# Patient Record
Sex: Female | Born: 1945
Health system: Southern US, Community
[De-identification: ages and names within clinical notes are randomized; demographics above are authoritative.]

## PROBLEM LIST (undated history)

## (undated) DIAGNOSIS — K219 Gastro-esophageal reflux disease without esophagitis: Secondary | ICD-10-CM

## (undated) DIAGNOSIS — E739 Lactose intolerance, unspecified: Secondary | ICD-10-CM

## (undated) DIAGNOSIS — M48061 Spinal stenosis, lumbar region without neurogenic claudication: Secondary | ICD-10-CM

## (undated) DIAGNOSIS — R7881 Bacteremia: Secondary | ICD-10-CM

## (undated) DIAGNOSIS — K589 Irritable bowel syndrome without diarrhea: Secondary | ICD-10-CM

## (undated) DIAGNOSIS — I7090 Unspecified atherosclerosis: Secondary | ICD-10-CM

## (undated) DIAGNOSIS — G629 Polyneuropathy, unspecified: Secondary | ICD-10-CM

## (undated) DIAGNOSIS — IMO0001 Reserved for inherently not codable concepts without codable children: Secondary | ICD-10-CM

## (undated) DIAGNOSIS — F329 Major depressive disorder, single episode, unspecified: Secondary | ICD-10-CM

## (undated) DIAGNOSIS — A0472 Enterocolitis due to Clostridium difficile, not specified as recurrent: Secondary | ICD-10-CM

## (undated) DIAGNOSIS — M1990 Unspecified osteoarthritis, unspecified site: Secondary | ICD-10-CM

## (undated) DIAGNOSIS — K269 Duodenal ulcer, unspecified as acute or chronic, without hemorrhage or perforation: Secondary | ICD-10-CM

## (undated) DIAGNOSIS — B9620 Unspecified Escherichia coli [E. coli] as the cause of diseases classified elsewhere: Secondary | ICD-10-CM

## (undated) DIAGNOSIS — H269 Unspecified cataract: Secondary | ICD-10-CM

## (undated) DIAGNOSIS — D649 Anemia, unspecified: Secondary | ICD-10-CM

## (undated) DIAGNOSIS — G43909 Migraine, unspecified, not intractable, without status migrainosus: Secondary | ICD-10-CM

## (undated) DIAGNOSIS — K5790 Diverticulosis of intestine, part unspecified, without perforation or abscess without bleeding: Secondary | ICD-10-CM

## (undated) DIAGNOSIS — T7840XA Allergy, unspecified, initial encounter: Secondary | ICD-10-CM

## (undated) DIAGNOSIS — I10 Essential (primary) hypertension: Secondary | ICD-10-CM

## (undated) DIAGNOSIS — F419 Anxiety disorder, unspecified: Secondary | ICD-10-CM

## (undated) DIAGNOSIS — F32A Depression, unspecified: Secondary | ICD-10-CM

## (undated) DIAGNOSIS — R7303 Prediabetes: Secondary | ICD-10-CM

## (undated) HISTORY — DX: Allergy, unspecified, initial encounter: T78.40XA

## (undated) HISTORY — DX: Unspecified Escherichia coli (E. coli) as the cause of diseases classified elsewhere: B96.20

## (undated) HISTORY — DX: Enterocolitis due to Clostridium difficile, not specified as recurrent: A04.72

## (undated) HISTORY — DX: Bacteremia: R78.81

## (undated) HISTORY — DX: Diverticulosis of intestine, part unspecified, without perforation or abscess without bleeding: K57.90

## (undated) HISTORY — PX: DIAGNOSTIC LAPAROSCOPY: SUR761

## (undated) HISTORY — DX: Irritable bowel syndrome, unspecified: K58.9

## (undated) HISTORY — DX: Unspecified cataract: H26.9

## (undated) HISTORY — DX: Anemia, unspecified: D64.9

## (undated) HISTORY — PX: CARPAL TUNNEL RELEASE: SHX101

## (undated) HISTORY — DX: Duodenal ulcer, unspecified as acute or chronic, without hemorrhage or perforation: K26.9

## (undated) HISTORY — PX: MENISCUS REPAIR: SHX5179

## (undated) HISTORY — PX: TOE SURGERY: SHX1073

## (undated) HISTORY — DX: Lactose intolerance, unspecified: E73.9

## (undated) HISTORY — PX: BLADDER SURGERY: SHX569

## (undated) HISTORY — PX: BACK SURGERY: SHX140

## (undated) HISTORY — DX: Unspecified osteoarthritis, unspecified site: M19.90

## (undated) HISTORY — DX: Essential (primary) hypertension: I10

## (undated) HISTORY — PX: WISDOM TOOTH EXTRACTION: SHX21

## (undated) HISTORY — PX: APPENDECTOMY: SHX54

## (undated) HISTORY — PX: OTHER SURGICAL HISTORY: SHX169

## (undated) HISTORY — DX: Anxiety disorder, unspecified: F41.9

## (undated) HISTORY — DX: Migraine, unspecified, not intractable, without status migrainosus: G43.909

---
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
## 1991-12-22 HISTORY — PX: TOTAL ABDOMINAL HYSTERECTOMY: SHX209

## 1998-12-16 ENCOUNTER — Encounter: Payer: Self-pay | Admitting: Gastroenterology

## 1998-12-16 ENCOUNTER — Ambulatory Visit (HOSPITAL_COMMUNITY): Admission: RE | Admit: 1998-12-16 | Discharge: 1998-12-16 | Payer: Self-pay | Admitting: Gastroenterology

## 1999-07-26 ENCOUNTER — Emergency Department (HOSPITAL_COMMUNITY): Admission: EM | Admit: 1999-07-26 | Discharge: 1999-07-26 | Payer: Self-pay | Admitting: Emergency Medicine

## 2000-02-06 ENCOUNTER — Other Ambulatory Visit: Admission: RE | Admit: 2000-02-06 | Discharge: 2000-02-06 | Payer: Self-pay | Admitting: *Deleted

## 2000-05-13 ENCOUNTER — Encounter: Payer: Self-pay | Admitting: *Deleted

## 2000-05-13 ENCOUNTER — Encounter: Admission: RE | Admit: 2000-05-13 | Discharge: 2000-05-13 | Payer: Self-pay | Admitting: *Deleted

## 2000-05-21 ENCOUNTER — Encounter: Admission: RE | Admit: 2000-05-21 | Discharge: 2000-05-21 | Payer: Self-pay | Admitting: *Deleted

## 2000-05-21 ENCOUNTER — Encounter: Payer: Self-pay | Admitting: *Deleted

## 2001-05-12 ENCOUNTER — Other Ambulatory Visit: Admission: RE | Admit: 2001-05-12 | Discharge: 2001-05-12 | Payer: Self-pay | Admitting: *Deleted

## 2001-06-04 ENCOUNTER — Emergency Department (HOSPITAL_COMMUNITY): Admission: EM | Admit: 2001-06-04 | Discharge: 2001-06-04 | Payer: Self-pay

## 2001-06-14 ENCOUNTER — Emergency Department (HOSPITAL_COMMUNITY): Admission: EM | Admit: 2001-06-14 | Discharge: 2001-06-14 | Payer: Self-pay | Admitting: Emergency Medicine

## 2002-01-20 ENCOUNTER — Encounter: Payer: Self-pay | Admitting: Family Medicine

## 2002-01-20 ENCOUNTER — Encounter: Admission: RE | Admit: 2002-01-20 | Discharge: 2002-01-20 | Payer: Self-pay | Admitting: Family Medicine

## 2002-04-19 ENCOUNTER — Encounter: Payer: Self-pay | Admitting: Family Medicine

## 2002-04-19 ENCOUNTER — Encounter: Admission: RE | Admit: 2002-04-19 | Discharge: 2002-04-19 | Payer: Self-pay | Admitting: Family Medicine

## 2002-06-28 ENCOUNTER — Encounter: Payer: Self-pay | Admitting: Gastroenterology

## 2004-12-29 ENCOUNTER — Ambulatory Visit: Payer: Self-pay | Admitting: Cardiology

## 2005-01-08 ENCOUNTER — Ambulatory Visit: Payer: Self-pay

## 2005-02-13 ENCOUNTER — Encounter: Admission: RE | Admit: 2005-02-13 | Discharge: 2005-02-13 | Payer: Self-pay | Admitting: Family Medicine

## 2006-02-24 ENCOUNTER — Encounter: Admission: RE | Admit: 2006-02-24 | Discharge: 2006-02-24 | Payer: Self-pay | Admitting: *Deleted

## 2006-06-30 ENCOUNTER — Encounter: Admission: RE | Admit: 2006-06-30 | Discharge: 2006-06-30 | Payer: Self-pay | Admitting: Family Medicine

## 2006-07-02 ENCOUNTER — Ambulatory Visit (HOSPITAL_COMMUNITY): Admission: RE | Admit: 2006-07-02 | Discharge: 2006-07-02 | Payer: Self-pay | Admitting: Family Medicine

## 2008-07-06 ENCOUNTER — Encounter: Payer: Self-pay | Admitting: Gastroenterology

## 2008-07-21 LAB — CONVERTED CEMR LAB: Pap Smear: NORMAL

## 2008-07-30 ENCOUNTER — Telehealth: Payer: Self-pay | Admitting: Gastroenterology

## 2008-08-10 ENCOUNTER — Encounter: Payer: Self-pay | Admitting: Gastroenterology

## 2009-01-04 ENCOUNTER — Encounter: Payer: Self-pay | Admitting: Family Medicine

## 2009-02-25 ENCOUNTER — Emergency Department (HOSPITAL_COMMUNITY): Admission: EM | Admit: 2009-02-25 | Discharge: 2009-02-25 | Payer: Self-pay | Admitting: Emergency Medicine

## 2009-03-27 ENCOUNTER — Ambulatory Visit: Payer: Self-pay | Admitting: Gastroenterology

## 2009-03-27 DIAGNOSIS — K921 Melena: Secondary | ICD-10-CM | POA: Insufficient documentation

## 2009-03-27 DIAGNOSIS — R1319 Other dysphagia: Secondary | ICD-10-CM | POA: Insufficient documentation

## 2009-03-27 DIAGNOSIS — R079 Chest pain, unspecified: Secondary | ICD-10-CM | POA: Insufficient documentation

## 2009-03-28 ENCOUNTER — Telehealth: Payer: Self-pay | Admitting: Gastroenterology

## 2009-04-01 ENCOUNTER — Ambulatory Visit: Payer: Self-pay | Admitting: Gastroenterology

## 2009-04-01 ENCOUNTER — Encounter: Payer: Self-pay | Admitting: Gastroenterology

## 2009-04-01 LAB — HM COLONOSCOPY

## 2009-04-03 ENCOUNTER — Encounter: Payer: Self-pay | Admitting: Gastroenterology

## 2009-04-03 ENCOUNTER — Telehealth: Payer: Self-pay | Admitting: Gastroenterology

## 2009-04-11 ENCOUNTER — Ambulatory Visit: Payer: Self-pay | Admitting: Family Medicine

## 2009-04-11 DIAGNOSIS — I10 Essential (primary) hypertension: Secondary | ICD-10-CM | POA: Insufficient documentation

## 2009-04-11 DIAGNOSIS — K279 Peptic ulcer, site unspecified, unspecified as acute or chronic, without hemorrhage or perforation: Secondary | ICD-10-CM | POA: Insufficient documentation

## 2009-05-14 ENCOUNTER — Ambulatory Visit: Payer: Self-pay | Admitting: Gastroenterology

## 2009-05-14 DIAGNOSIS — K259 Gastric ulcer, unspecified as acute or chronic, without hemorrhage or perforation: Secondary | ICD-10-CM | POA: Insufficient documentation

## 2009-05-14 DIAGNOSIS — K269 Duodenal ulcer, unspecified as acute or chronic, without hemorrhage or perforation: Secondary | ICD-10-CM | POA: Insufficient documentation

## 2009-06-09 ENCOUNTER — Emergency Department (HOSPITAL_COMMUNITY): Admission: EM | Admit: 2009-06-09 | Discharge: 2009-06-09 | Payer: Self-pay | Admitting: Emergency Medicine

## 2009-08-07 ENCOUNTER — Ambulatory Visit: Payer: Self-pay | Admitting: Family Medicine

## 2009-08-07 DIAGNOSIS — R519 Headache, unspecified: Secondary | ICD-10-CM | POA: Insufficient documentation

## 2009-08-07 DIAGNOSIS — R51 Headache: Secondary | ICD-10-CM | POA: Insufficient documentation

## 2009-08-07 DIAGNOSIS — R5383 Other fatigue: Secondary | ICD-10-CM

## 2009-08-07 DIAGNOSIS — R5381 Other malaise: Secondary | ICD-10-CM | POA: Insufficient documentation

## 2009-09-13 ENCOUNTER — Telehealth: Payer: Self-pay | Admitting: Family Medicine

## 2010-01-31 ENCOUNTER — Ambulatory Visit: Payer: Self-pay | Admitting: Family Medicine

## 2010-01-31 DIAGNOSIS — M545 Low back pain, unspecified: Secondary | ICD-10-CM | POA: Insufficient documentation

## 2010-01-31 DIAGNOSIS — R635 Abnormal weight gain: Secondary | ICD-10-CM | POA: Insufficient documentation

## 2010-01-31 LAB — CONVERTED CEMR LAB
Bilirubin Urine: NEGATIVE
Glucose, Urine, Semiquant: NEGATIVE
Ketones, urine, test strip: NEGATIVE
Urobilinogen, UA: 0.2

## 2010-02-03 ENCOUNTER — Telehealth: Payer: Self-pay | Admitting: Family Medicine

## 2010-02-03 LAB — CONVERTED CEMR LAB
AST: 20 units/L (ref 0–37)
Albumin: 3.8 g/dL (ref 3.5–5.2)
Bilirubin, Direct: 0.1 mg/dL (ref 0.0–0.3)
Chloride: 100 meq/L (ref 96–112)
Eosinophils Absolute: 0.1 10*3/uL (ref 0.0–0.7)
GFR calc non Af Amer: 59.72 mL/min (ref >60)
Glucose, Bld: 77 mg/dL (ref 70–99)
HCT: 40.1 % (ref 36.0–46.0)
Hemoglobin: 13.5 g/dL (ref 12.0–15.0)
Hgb A1c MFr Bld: 5.8 % (ref 4.6–6.5)
Lymphocytes Relative: 23.9 % (ref 12.0–46.0)
Monocytes Relative: 5.3 % (ref 3.0–12.0)
Neutro Abs: 4.7 10*3/uL (ref 1.4–7.7)
Platelets: 244 10*3/uL (ref 150.0–400.0)
Sed Rate: 23 mm/hr — ABNORMAL HIGH (ref 0–22)
TSH: 1.6 microintl units/mL (ref 0.35–5.50)
Total Protein: 6.6 g/dL (ref 6.0–8.3)
WBC: 6.8 10*3/uL (ref 4.5–10.5)

## 2010-09-04 ENCOUNTER — Ambulatory Visit: Payer: Self-pay | Admitting: Family Medicine

## 2010-09-04 DIAGNOSIS — M79609 Pain in unspecified limb: Secondary | ICD-10-CM | POA: Insufficient documentation

## 2010-09-05 ENCOUNTER — Telehealth: Payer: Self-pay | Admitting: Family Medicine

## 2010-09-11 LAB — CONVERTED CEMR LAB
ALT: 15 units/L (ref 0–35)
AST: 19 units/L (ref 0–37)
BUN: 30 mg/dL — ABNORMAL HIGH (ref 6–23)
Bilirubin, Direct: 0.1 mg/dL (ref 0.0–0.3)
Calcium: 9.6 mg/dL (ref 8.4–10.5)
Creatinine, Ser: 0.9 mg/dL (ref 0.4–1.2)
Direct LDL: 154 mg/dL
Glucose, Bld: 82 mg/dL (ref 70–99)
Hemoglobin: 14 g/dL (ref 12.0–15.0)
Lymphocytes Relative: 24.5 % (ref 12.0–46.0)
Lymphs Abs: 1.5 10*3/uL (ref 0.7–4.0)
Monocytes Relative: 7.4 % (ref 3.0–12.0)
Neutrophils Relative %: 66.3 % (ref 43.0–77.0)
RDW: 13.2 % (ref 11.5–14.6)
TSH: 1.14 microintl units/mL (ref 0.35–5.50)
Triglycerides: 56 mg/dL (ref 0.0–149.0)
VLDL: 11.2 mg/dL (ref 0.0–40.0)

## 2010-09-26 ENCOUNTER — Encounter: Payer: Self-pay | Admitting: Family Medicine

## 2010-09-26 ENCOUNTER — Ambulatory Visit: Payer: Self-pay | Admitting: Internal Medicine

## 2010-09-26 ENCOUNTER — Telehealth: Payer: Self-pay | Admitting: Internal Medicine

## 2010-09-26 DIAGNOSIS — L03211 Cellulitis of face: Secondary | ICD-10-CM

## 2010-09-26 DIAGNOSIS — L0201 Cutaneous abscess of face: Secondary | ICD-10-CM | POA: Insufficient documentation

## 2010-09-26 LAB — HM MAMMOGRAPHY: HM Mammogram: NORMAL

## 2010-09-27 ENCOUNTER — Ambulatory Visit: Payer: Self-pay | Admitting: Family Medicine

## 2010-09-29 ENCOUNTER — Ambulatory Visit: Payer: Self-pay | Admitting: Internal Medicine

## 2010-09-29 DIAGNOSIS — A4902 Methicillin resistant Staphylococcus aureus infection, unspecified site: Secondary | ICD-10-CM | POA: Insufficient documentation

## 2010-09-29 DIAGNOSIS — R197 Diarrhea, unspecified: Secondary | ICD-10-CM | POA: Insufficient documentation

## 2010-09-30 ENCOUNTER — Encounter: Payer: Self-pay | Admitting: Internal Medicine

## 2010-11-03 ENCOUNTER — Telehealth: Payer: Self-pay | Admitting: Internal Medicine

## 2011-01-20 NOTE — Consult Note (Signed)
Summary: [facility]  [facility]   Imported By: Maryln Gottron 10/03/2010 10:37:18  _____________________________________________________________________  External Attachment:    Type:   Image     Comment:   External Document

## 2011-01-20 NOTE — Progress Notes (Signed)
Summary: Patient Concerns  Phone Note Call from Patient Call back at [phone]   Caller: Patient Summary of Call: Patient called this morning with some concerns with the billing of her last OV's and the amount of rocpehin recieved at the appt with the Saturday clinic. I releived the concerns on the billing portion even though patient was still unhappy that the administration fee for an injection during the OV was not included in the OV price.   Patient was most unhappy with the fact that she recieved 1 unit of rocephin at her visit with Dr. Fabian Sharp and then received 4 units of rocephin at the Saturday clinic visit with Dr. Milinda Antis. Patient was told to follow-up at Saturday clinic by Dr. Fabian Sharp. Patient stated that after the saturday appt she started feeling very anxious and through out the next few days had alot of abd cramping and anxiety, and diarrhea. Patient is just very concerned about the amount of the abx she had in her body.  Initial call taken by: Harold Barban,  November 03, 2010 11:15 AM  Follow-up for Phone Call        patient had  standard  1 gram of rocephin  each day  dosing. ( it is billed in 250 mg units and thus says 4)   Have triage   discuss clinical concerns with her. If still have Gi problems then  may need a follow up .  Follow-up by: Madelin Headings MD,  November 03, 2010 12:50 PM  Additional Follow-up for Phone Call Additional follow up Details #1::        explained to pt that in our computer system Rocephin is billed at 250mg  and we have to change the units when we give 1g.  She wasn't thrilled about it but she understood Additional Follow-up by: Alfred Levins, CMA,  November 03, 2010 1:14 PM
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
## 2011-01-20 NOTE — Progress Notes (Signed)
Summary: REQ FOR LAB RESULTS  Phone Note Call from Patient   Caller: Patient [phone] Reason for Call: Talk to Nurse Summary of Call: Pt called to req lab results...Marland KitchenMarland Kitchen Pt adv that she can be reached at [phone] to advise.  Pt adv that she will need some type of documentation stating that she has had labs (pt adv that it doesn't have to be lab results).... Pt req same so that it can be turned into insurance for wellness benefit??   Initial call taken by: Debbra Riding,  February 03, 2010 10:29 AM  Follow-up for Phone Call        Labs printed and mailed to pt home Follow-up by: Sid Falcon LPN,  February 03, 2010 1:58 PM

## 2011-01-20 NOTE — Progress Notes (Signed)
Summary: pt feeling feverish  Phone Note Call from Patient Call back at Home Phone [phone]   Caller: Patient Summary of Call: Pt state that she is starting to feel feverish, her cheeks are warm and rosey. Pt doesn't feel bad. Pt wants to know if she can take the other medication.   Initial call taken by: Romualdo Bolk, CMA Duncan Dull),  September 26, 2010 4:57 PM  Follow-up for Phone Call        Per Dr. Fabian Sharp- can go ahead and take the other antibiotic. If fever gets 101-102 call the oncall nurse and keep appt for tomorrow. Pt aware of this. Follow-up by: Romualdo Bolk, CMA (AAMA),  September 26, 2010 4:58 PM
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
## 2011-01-20 NOTE — Progress Notes (Signed)
Summary: Home BP cuff Rx request  Phone Note Call from Patient Call back at Home Phone [phone]   Caller: Patient Call For: Taylor Peat MD Summary of Call: Pt calling to ask for a written Rx for Home BP cuff.  If she has a prescription, her FLEX acct will help pay for it. Her wrist one is not accurate.  Also, to help with her cronic headaches, is there something Dr [name] can recomend to replace the Darvocet.  Currently taking the "Goodys extra strength  (she stated she knew she was not supposed to be taking it) Initial call taken by: Sid Falcon LPN,  September 05, 2010 12:32 PM  Follow-up for Phone Call        I will write Rx for BP cuff. For her chronic headaches, I rec trial of Nortriptyline 25 mg by mouth at bedtime and office f/u in 2 months to reassess.  We will be trying this in attempt to blunt/prevent her headaches.  She should not use this acutely. Follow-up by: Taylor Peat MD,  September 05, 2010 1:27 PM  Additional Follow-up for Phone Call Additional follow up Details #1::        Pt had a bad reaction to Amitriptyline 15 years ago,  "sob, could not catch her breath, could not move, felt like her heart stopped".    This med was not on her allergy list, so it has just been added Additional Follow-up by: Sid Falcon LPN,  September 05, 2010 5:00 PM   New Allergies: ! AMITRIPTYLINE HCL (AMITRIPTYLINE HCL) Additional Follow-up for Phone Call Additional follow up Details #2::    Per Dr [name], inform pt to not take this medicine at this time.  [facility] informed of allergy, cancelled Rx order at this time.  Pt informed and Dr [name] suggested she come for OV to further discuss, pt voiced her understanding.  She will pick-up BP prescription at that time Follow-up by: Sid Falcon LPN,  September 05, 2010 5:09 PM  New/Updated Medications: NORTRIPTYLINE HCL 25 MG CAPS (NORTRIPTYLINE HCL) one by mouth daily at bedtime New Allergies: !  AMITRIPTYLINE HCL (AMITRIPTYLINE HCL)Prescriptions: NORTRIPTYLINE HCL 25 MG CAPS (NORTRIPTYLINE HCL) one by mouth daily at bedtime  #30 x 1   Entered by:   Sid Falcon LPN   Authorized by:   Taylor Peat MD   Signed by:   Sid Falcon LPN on 12/23/7251   Method used:   Electronically to        [facility]* (retail)       [address]       [place], [place]  [address]       Ph: [phone]       Fax: [phone]   RxID:   [id number]
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
## 2011-03-06 ENCOUNTER — Ambulatory Visit (INDEPENDENT_AMBULATORY_CARE_PROVIDER_SITE_OTHER): Payer: 59 | Admitting: Family Medicine

## 2011-03-06 ENCOUNTER — Telehealth: Payer: Self-pay | Admitting: *Deleted

## 2011-03-06 VITALS — BP 130/90 | Temp 98.6°F | Ht 67.25 in | Wt 188.0 lb

## 2011-03-06 DIAGNOSIS — R5383 Other fatigue: Secondary | ICD-10-CM

## 2011-03-06 DIAGNOSIS — R5381 Other malaise: Secondary | ICD-10-CM

## 2011-03-06 DIAGNOSIS — M25519 Pain in unspecified shoulder: Secondary | ICD-10-CM

## 2011-03-06 DIAGNOSIS — R413 Other amnesia: Secondary | ICD-10-CM

## 2011-03-06 DIAGNOSIS — Z780 Asymptomatic menopausal state: Secondary | ICD-10-CM

## 2011-03-06 LAB — VITAMIN B12: Vitamin B-12: 374 pg/mL (ref 211–911)

## 2011-03-06 LAB — TSH: TSH: 1.24 u[IU]/mL (ref 0.35–5.50)

## 2011-03-06 NOTE — Telephone Encounter (Signed)
Appt schueduled today as she only has one day off work.

## 2011-03-06 NOTE — Progress Notes (Signed)
  Subjective:    Patient ID: Taylor Gay, female    DOB: [birth year], 65 y.o.   MRN: [id number]  HPI  Patient seen to discuss the following issues   Some persistent right upper extremity pain. Was seen by orthopedist and had shoulder injected for presumed bursitis or rotator cuff tendinitis. No improvement. Pain is mostly deltoid region. Worse with internal rotation and abduction against resistance. She's tried over-the-counter analgesics without much improvement. No neck pain. Previous x-rays did not show any significant degenerative changes. Also had x-rays of humerus which were unremarkable.    Patient has some concerns for some short-term memory deficits. Sometimes forgets things like where she is driving. She has increased fatigue. She is concerned that the symptoms are related to post-menopause and estrogen deficiency. No headaches or any focal neurologic symptoms.   Patient has several questions regarding hormone replacement. Previously has taken estrogen patch. Has been considering some type of compounded topical estrogen. Has frequent hot flashes. Decreased energy. Previous hysterectomy.   Review of Systems  Constitutional: Negative for fever, chills, appetite change and fatigue.  HENT: Negative for ear pain and sore throat.   Eyes: Negative for visual disturbance.  Respiratory: Negative for cough and shortness of breath.   Cardiovascular: Negative for chest pain, palpitations and leg swelling.  Gastrointestinal: Negative for abdominal pain.  Genitourinary: Negative for dysuria.  Skin: Negative for rash.  Neurological: Negative for syncope and headaches.  Psychiatric/Behavioral: Negative for dysphoric mood.       Objective:   Physical Exam  patient is alert and healthy in appearance. Neck supple no mass Chest clear to auscultation Heart regular rhythm and rate with no murmur   right shoulder reveals restricted range of motion with internal rotation and abduction secondary to  pain. No a.c. Joint tenderness. No bicipital tenderness. Extremities no edema. Skin no rash.       Assessment & Plan:   #1 right shoulder and arm pain. Suspect supraspinatous tendinitis. She is not interested in further injections at this time. Discussed physical therapy but she is not interested.  #2 postmenopausal. She is considering going back on topical estrogen  #3 question of impaired short-term memory. Check TSH and B12

## 2011-03-07 ENCOUNTER — Encounter: Payer: Self-pay | Admitting: Family Medicine

## 2011-03-09 NOTE — Progress Notes (Signed)
Quick Note:  Pt informed and I asked pt to call back with Vivelle dot information is she is still interrested ______

## 2011-03-10 ENCOUNTER — Telehealth: Payer: Self-pay | Admitting: *Deleted

## 2011-03-10 DIAGNOSIS — Z780 Asymptomatic menopausal state: Secondary | ICD-10-CM

## 2011-03-10 DIAGNOSIS — R52 Pain, unspecified: Secondary | ICD-10-CM

## 2011-03-10 MED ORDER — ESTRADIOL 0.0375 MG/24HR TD PTTW: 1.0000 | MEDICATED_PATCH | TRANSDERMAL | Status: DC

## 2011-03-10 NOTE — Telephone Encounter (Signed)
OK to use the ketoprofen and I would recommend we increase her Vivelle to 0.0375 mg patch and Ok to refill for 6 months.  I went ahead and created rx and sent to [facility].  Let patient know.  Thanks.

## 2011-03-10 NOTE — Telephone Encounter (Signed)
Pt called to report the [place] is not covered by her insurance plan, $137 + out of pocket. [facility] can compound Ketoprofen 20% in PLO.?  $37.00, apply to affected area 3 times a day as needed.  Also, pt was on Vivelle dot 0.025, but it was not very effective. She would be interrested in a compound product or a higher Vivelle patch

## 2011-03-11 NOTE — Telephone Encounter (Signed)
Pt informed Ketoprofen called in, OK for [facility] to compound as well as Vivelle Dot, informed on pt VM

## 2011-03-12 NOTE — Telephone Encounter (Signed)
completed

## 2011-04-02 LAB — BASIC METABOLIC PANEL
CO2: 29 mEq/L (ref 19–32)
Calcium: 8.7 mg/dL (ref 8.4–10.5)
Chloride: 100 mEq/L (ref 96–112)
Creatinine, Ser: 1.11 mg/dL (ref 0.4–1.2)
GFR calc Af Amer: >60 mL/min (ref >60)
GFR calc non Af Amer: 50 mL/min — ABNORMAL LOW (ref >60)
Potassium: 3.2 mEq/L — ABNORMAL LOW (ref 3.5–5.1)
Sodium: 136 mEq/L (ref 135–145)

## 2011-04-02 LAB — POCT CARDIAC MARKERS
CKMB, poc: <1 ng/mL — ABNORMAL LOW (ref 1.0–8.0)
Myoglobin, poc: 71.7 ng/mL (ref 12–200)
Troponin i, poc: <0.05 ng/mL (ref 0.00–0.09)

## 2011-04-02 LAB — CBC
Hemoglobin: 13.9 g/dL (ref 12.0–15.0)
RBC: 4.44 MIL/uL (ref 3.87–5.11)
RDW: 13.5 % (ref 11.5–15.5)

## 2011-04-02 LAB — DIFFERENTIAL
Basophils Relative: 1 % (ref 0–1)
Lymphs Abs: 2.3 10*3/uL (ref 0.7–4.0)
Neutro Abs: 4.4 10*3/uL (ref 1.7–7.7)

## 2012-01-16 ENCOUNTER — Encounter (HOSPITAL_COMMUNITY): Payer: Self-pay

## 2012-01-16 ENCOUNTER — Emergency Department (HOSPITAL_COMMUNITY): Payer: 59

## 2012-01-16 ENCOUNTER — Other Ambulatory Visit: Payer: Self-pay

## 2012-01-16 ENCOUNTER — Encounter (HOSPITAL_COMMUNITY): Admission: EM | Disposition: A | Discharge status: Home / Self Care | Payer: Self-pay | Source: Home / Self Care

## 2012-01-16 ENCOUNTER — Emergency Department (HOSPITAL_COMMUNITY): Payer: 59 | Admitting: Anesthesiology

## 2012-01-16 ENCOUNTER — Encounter (HOSPITAL_COMMUNITY): Payer: Self-pay | Admitting: Anesthesiology

## 2012-01-16 ENCOUNTER — Inpatient Hospital Stay (HOSPITAL_COMMUNITY)
Admission: EM | Admit: 2012-01-16 | Discharge: 2012-01-18 | DRG: 494 | Disposition: A | Discharge status: Home / Self Care | Payer: 59 | Attending: Emergency Medicine | Admitting: Emergency Medicine

## 2012-01-16 DIAGNOSIS — M129 Arthropathy, unspecified: Secondary | ICD-10-CM | POA: Diagnosis present

## 2012-01-16 DIAGNOSIS — G43909 Migraine, unspecified, not intractable, without status migrainosus: Secondary | ICD-10-CM | POA: Diagnosis present

## 2012-01-16 DIAGNOSIS — Z8711 Personal history of peptic ulcer disease: Secondary | ICD-10-CM

## 2012-01-16 DIAGNOSIS — S82853A Displaced trimalleolar fracture of unspecified lower leg, initial encounter for closed fracture: Principal | ICD-10-CM | POA: Diagnosis present

## 2012-01-16 DIAGNOSIS — K5730 Diverticulosis of large intestine without perforation or abscess without bleeding: Secondary | ICD-10-CM | POA: Diagnosis present

## 2012-01-16 DIAGNOSIS — K589 Irritable bowel syndrome without diarrhea: Secondary | ICD-10-CM | POA: Diagnosis present

## 2012-01-16 DIAGNOSIS — I10 Essential (primary) hypertension: Secondary | ICD-10-CM | POA: Diagnosis present

## 2012-01-16 DIAGNOSIS — S82842A Displaced bimalleolar fracture of left lower leg, initial encounter for closed fracture: Secondary | ICD-10-CM

## 2012-01-16 DIAGNOSIS — E739 Lactose intolerance, unspecified: Secondary | ICD-10-CM | POA: Diagnosis present

## 2012-01-16 DIAGNOSIS — F411 Generalized anxiety disorder: Secondary | ICD-10-CM | POA: Diagnosis present

## 2012-01-16 DIAGNOSIS — Z79899 Other long term (current) drug therapy: Secondary | ICD-10-CM

## 2012-01-16 DIAGNOSIS — W010XXA Fall on same level from slipping, tripping and stumbling without subsequent striking against object, initial encounter: Secondary | ICD-10-CM | POA: Diagnosis present

## 2012-01-16 DIAGNOSIS — Y998 Other external cause status: Secondary | ICD-10-CM

## 2012-01-16 DIAGNOSIS — Y92009 Unspecified place in unspecified non-institutional (private) residence as the place of occurrence of the external cause: Secondary | ICD-10-CM

## 2012-01-16 HISTORY — PX: ORIF ANKLE FRACTURE: SHX5408

## 2012-01-16 LAB — BASIC METABOLIC PANEL
BUN: 31 mg/dL — ABNORMAL HIGH (ref 6–23)
Sodium: 138 mEq/L (ref 135–145)

## 2012-01-16 LAB — CBC
HCT: 39.1 % (ref 36.0–46.0)
MCV: 84.3 fL (ref 78.0–100.0)
Platelets: 262 10*3/uL (ref 150–400)

## 2012-01-16 LAB — SAMPLE TO BLOOD BANK

## 2012-01-16 SURGERY — OPEN REDUCTION INTERNAL FIXATION (ORIF) ANKLE FRACTURE
Anesthesia: General | Site: Ankle | Laterality: Left | Wound class: Clean

## 2012-01-16 MED ORDER — POTASSIUM CHLORIDE 10 MEQ/100ML IV SOLN
10.0000 meq | INTRAVENOUS | Status: DC
  Administered 2012-01-16: 10 meq via INTRAVENOUS
  Filled 2012-01-16: qty 100

## 2012-01-16 MED ORDER — TRIAMTERENE-HCTZ 75-50 MG PO TABS
1.0000 | ORAL_TABLET | Freq: Every day | ORAL | Status: DC
  Administered 2012-01-17: 1 via ORAL
  Filled 2012-01-16 (×2): qty 1

## 2012-01-16 MED ORDER — ONDANSETRON HCL 4 MG/2ML IJ SOLN: 4.0000 mg | Freq: Four times a day (QID) | INTRAMUSCULAR | Status: DC | PRN

## 2012-01-16 MED ORDER — CEFAZOLIN SODIUM 1-5 GM-% IV SOLN
1.0000 g | Freq: Three times a day (TID) | INTRAVENOUS | Status: AC
  Administered 2012-01-17 (×2): 1 g via INTRAVENOUS
  Filled 2012-01-16 (×3): qty 50

## 2012-01-16 MED ORDER — VECURONIUM BROMIDE 10 MG IV SOLR
INTRAVENOUS | Status: DC | PRN
  Administered 2012-01-16: 5 mg via INTRAVENOUS
  Administered 2012-01-16: 2 mg via INTRAVENOUS

## 2012-01-16 MED ORDER — CLINDAMYCIN PHOSPHATE 600 MG/50ML IV SOLN
INTRAVENOUS | Status: DC | PRN
  Administered 2012-01-16: 600 mg via INTRAVENOUS

## 2012-01-16 MED ORDER — LORAZEPAM 2 MG/ML IJ SOLN
INTRAMUSCULAR | Status: AC
  Filled 2012-01-16: qty 1

## 2012-01-16 MED ORDER — GLYCOPYRROLATE 0.2 MG/ML IJ SOLN
INTRAMUSCULAR | Status: DC | PRN
  Administered 2012-01-16: .6 mg via INTRAVENOUS

## 2012-01-16 MED ORDER — ACETAMINOPHEN 10 MG/ML IV SOLN
INTRAVENOUS | Status: DC | PRN
  Administered 2012-01-16: 1000 mg via INTRAVENOUS

## 2012-01-16 MED ORDER — NEOSTIGMINE METHYLSULFATE 1 MG/ML IJ SOLN
INTRAMUSCULAR | Status: DC | PRN
  Administered 2012-01-16: 5 mg via INTRAVENOUS

## 2012-01-16 MED ORDER — OXYCODONE-ACETAMINOPHEN 5-325 MG PO TABS
1.0000 | ORAL_TABLET | ORAL | Status: DC | PRN
  Administered 2012-01-17: 2 via ORAL
  Administered 2012-01-17 – 2012-01-18 (×4): 1 via ORAL
  Filled 2012-01-16 (×4): qty 1
  Filled 2012-01-16: qty 2

## 2012-01-16 MED ORDER — CLINDAMYCIN PHOSPHATE 600 MG/50ML IV SOLN
600.0000 mg | INTRAVENOUS | Status: DC
  Filled 2012-01-16: qty 50

## 2012-01-16 MED ORDER — SODIUM CHLORIDE 0.9 % IV SOLN
INTRAVENOUS | Status: AC | PRN
  Administered 2012-01-16: 300 mL via INTRAVENOUS

## 2012-01-16 MED ORDER — DEXAMETHASONE SODIUM PHOSPHATE 4 MG/ML IJ SOLN
INTRAMUSCULAR | Status: DC | PRN
  Administered 2012-01-16: 4 mg via INTRAVENOUS

## 2012-01-16 MED ORDER — METHOCARBAMOL 100 MG/ML IJ SOLN
500.0000 mg | Freq: Four times a day (QID) | INTRAVENOUS | Status: DC | PRN
  Filled 2012-01-16: qty 5

## 2012-01-16 MED ORDER — ASPIRIN 325 MG PO TABS
325.0000 mg | ORAL_TABLET | Freq: Two times a day (BID) | ORAL | Status: DC
  Administered 2012-01-17 – 2012-01-18 (×3): 325 mg via ORAL
  Filled 2012-01-16 (×5): qty 1

## 2012-01-16 MED ORDER — PROPOFOL 10 MG/ML IV EMUL
INTRAVENOUS | Status: DC | PRN
  Administered 2012-01-16: 200 mg via INTRAVENOUS

## 2012-01-16 MED ORDER — FAMOTIDINE 20 MG PO TABS
20.0000 mg | ORAL_TABLET | Freq: Two times a day (BID) | ORAL | Status: DC
  Administered 2012-01-16 – 2012-01-17 (×3): 20 mg via ORAL
  Filled 2012-01-16 (×5): qty 1

## 2012-01-16 MED ORDER — METHOCARBAMOL 500 MG PO TABS
500.0000 mg | ORAL_TABLET | Freq: Four times a day (QID) | ORAL | Status: DC | PRN
  Administered 2012-01-17 – 2012-01-18 (×3): 500 mg via ORAL
  Filled 2012-01-16 (×3): qty 1

## 2012-01-16 MED ORDER — ESMOLOL HCL 10 MG/ML IV SOLN
INTRAVENOUS | Status: DC | PRN
  Administered 2012-01-16: 10 mg via INTRAVENOUS
  Administered 2012-01-16: 40 mg via INTRAVENOUS

## 2012-01-16 MED ORDER — CEFAZOLIN SODIUM 1-5 GM-% IV SOLN
1.0000 g | Freq: Three times a day (TID) | INTRAVENOUS | Status: DC
  Administered 2012-01-16: 2 g via INTRAVENOUS
  Filled 2012-01-16 (×3): qty 50

## 2012-01-16 MED ORDER — CLINDAMYCIN PHOSPHATE 600 MG/50ML IV SOLN
600.0000 mg | Freq: Three times a day (TID) | INTRAVENOUS | Status: AC
  Administered 2012-01-16 – 2012-01-17 (×2): 600 mg via INTRAVENOUS
  Filled 2012-01-16 (×3): qty 50

## 2012-01-16 MED ORDER — ONDANSETRON HCL 4 MG/2ML IJ SOLN
INTRAMUSCULAR | Status: AC
  Administered 2012-01-16: 14:00:00 via INTRAVENOUS
  Filled 2012-01-16: qty 2

## 2012-01-16 MED ORDER — ONDANSETRON HCL 4 MG/2ML IJ SOLN
INTRAMUSCULAR | Status: DC | PRN
  Administered 2012-01-16: 4 mg via INTRAVENOUS

## 2012-01-16 MED ORDER — FENTANYL CITRATE 0.05 MG/ML IJ SOLN
100.0000 ug | Freq: Once | INTRAMUSCULAR | Status: DC
  Filled 2012-01-16: qty 2

## 2012-01-16 MED ORDER — 0.9 % SODIUM CHLORIDE (POUR BTL) OPTIME
TOPICAL | Status: DC | PRN
  Administered 2012-01-16: 1000 mL

## 2012-01-16 MED ORDER — BUPIVACAINE-EPINEPHRINE PF 0.5-1:200000 % IJ SOLN
INTRAMUSCULAR | Status: DC | PRN
  Administered 2012-01-16: 30 mL

## 2012-01-16 MED ORDER — MEPERIDINE HCL 25 MG/ML IJ SOLN: 6.2500 mg | INTRAMUSCULAR | Status: DC | PRN

## 2012-01-16 MED ORDER — DEXTROSE 5 % IV SOLN
INTRAVENOUS | Status: DC | PRN
  Administered 2012-01-16: 16:00:00 via INTRAVENOUS

## 2012-01-16 MED ORDER — FENTANYL CITRATE 0.05 MG/ML IJ SOLN
INTRAMUSCULAR | Status: DC | PRN
  Administered 2012-01-16: 100 ug via INTRAVENOUS
  Administered 2012-01-16 (×5): 50 ug via INTRAVENOUS
  Administered 2012-01-16: 150 ug via INTRAVENOUS

## 2012-01-16 MED ORDER — LORATADINE 10 MG PO TABS
10.0000 mg | ORAL_TABLET | Freq: Every day | ORAL | Status: DC
  Administered 2012-01-17: 10 mg via ORAL
  Filled 2012-01-16 (×2): qty 1

## 2012-01-16 MED ORDER — ONDANSETRON HCL 4 MG/2ML IJ SOLN
4.0000 mg | Freq: Once | INTRAMUSCULAR | Status: AC | PRN
  Administered 2012-01-16: 4 mg via INTRAVENOUS

## 2012-01-16 MED ORDER — SUCCINYLCHOLINE CHLORIDE 20 MG/ML IJ SOLN
INTRAMUSCULAR | Status: DC | PRN
  Administered 2012-01-16: 100 mg via INTRAVENOUS

## 2012-01-16 MED ORDER — ETOMIDATE 2 MG/ML IV SOLN
0.2000 mg/kg | Freq: Once | INTRAVENOUS | Status: AC
  Administered 2012-01-16: 17.32 mg via INTRAVENOUS
  Filled 2012-01-16: qty 10

## 2012-01-16 MED ORDER — ONDANSETRON HCL 4 MG/2ML IJ SOLN: 4.0000 mg | Freq: Once | INTRAMUSCULAR | Status: DC

## 2012-01-16 MED ORDER — LACTATED RINGERS IV SOLN
INTRAVENOUS | Status: DC | PRN
  Administered 2012-01-16 (×3): via INTRAVENOUS

## 2012-01-16 MED ORDER — DROPERIDOL 2.5 MG/ML IJ SOLN
INTRAMUSCULAR | Status: DC | PRN
  Administered 2012-01-16: 0.625 mg via INTRAVENOUS

## 2012-01-16 MED ORDER — ONDANSETRON HCL 4 MG/2ML IJ SOLN
INTRAMUSCULAR | Status: AC
  Administered 2012-01-16: 10:00:00 via INTRAVENOUS
  Filled 2012-01-16: qty 2

## 2012-01-16 MED ORDER — MIDAZOLAM HCL 5 MG/5ML IJ SOLN
INTRAMUSCULAR | Status: DC | PRN
  Administered 2012-01-16: 2 mg via INTRAVENOUS

## 2012-01-16 MED ORDER — KETOROLAC TROMETHAMINE 30 MG/ML IJ SOLN
30.0000 mg | Freq: Once | INTRAMUSCULAR | Status: AC
  Administered 2012-01-16: 15 mg via INTRAVENOUS

## 2012-01-16 MED ORDER — EPHEDRINE SULFATE 50 MG/ML IJ SOLN
INTRAMUSCULAR | Status: DC | PRN
  Administered 2012-01-16 (×2): 5 mg via INTRAVENOUS

## 2012-01-16 MED ORDER — FENTANYL CITRATE 0.05 MG/ML IJ SOLN
50.0000 ug | Freq: Once | INTRAMUSCULAR | Status: AC
  Administered 2012-01-16: 50 ug via INTRAVENOUS
  Filled 2012-01-16: qty 2

## 2012-01-16 MED ORDER — HYDROMORPHONE HCL PF 1 MG/ML IJ SOLN: 0.5000 mg | INTRAMUSCULAR | Status: DC | PRN

## 2012-01-16 MED ORDER — HYDROMORPHONE HCL PF 1 MG/ML IJ SOLN: 0.2500 mg | INTRAMUSCULAR | Status: DC | PRN

## 2012-01-16 MED ORDER — LORAZEPAM 2 MG/ML IJ SOLN
1.0000 mg | Freq: Once | INTRAMUSCULAR | Status: AC
  Administered 2012-01-16: 1 mg via INTRAVENOUS

## 2012-01-16 SURGICAL SUPPLY — 60 items
BANDAGE ELASTIC 4 VELCRO ST LF (GAUZE/BANDAGES/DRESSINGS) ×2 IMPLANT
BANDAGE ELASTIC 6 VELCRO ST LF (GAUZE/BANDAGES/DRESSINGS) ×2 IMPLANT
BANDAGE ESMARK 6X9 LF (GAUZE/BANDAGES/DRESSINGS) ×1 IMPLANT
BIT DRILL 2.5X2.75 QC CALB (BIT) ×1 IMPLANT
BIT DRILL 2.9 CANN QC NONSTRL (BIT) ×1 IMPLANT
BIT DRILL 3.5X5.5 QC CALB (BIT) ×1 IMPLANT
BNDG CMPR 9X6 STRL LF SNTH (GAUZE/BANDAGES/DRESSINGS) ×1
BNDG ESMARK 6X9 LF (GAUZE/BANDAGES/DRESSINGS) ×2
CLOTH BEACON ORANGE TIMEOUT ST (SAFETY) ×2 IMPLANT
COVER MAYO STAND STRL (DRAPES) ×1 IMPLANT
COVER SURGICAL LIGHT HANDLE (MISCELLANEOUS) ×3 IMPLANT
CUFF TOURNIQUET SINGLE 18IN (TOURNIQUET CUFF) IMPLANT
CUFF TOURNIQUET SINGLE 24IN (TOURNIQUET CUFF) IMPLANT
CUFF TOURNIQUET SINGLE 34IN LL (TOURNIQUET CUFF) ×1 IMPLANT
CUFF TOURNIQUET SINGLE 44IN (TOURNIQUET CUFF) IMPLANT
DECANTER SPIKE VIAL GLASS SM (MISCELLANEOUS) IMPLANT
DRAPE C-ARM 42X72 X-RAY (DRAPES) ×1 IMPLANT
DRAPE INCISE IOBAN 66X45 STRL (DRAPES) ×2 IMPLANT
DRAPE OEC MINIVIEW 54X84 (DRAPES) IMPLANT
DRAPE X RAY CASS MED 25220 (DRAPES) IMPLANT
DRSG ADAPTIC 3X8 NADH LF (GAUZE/BANDAGES/DRESSINGS) ×2 IMPLANT
DRSG PAD ABDOMINAL 8X10 ST (GAUZE/BANDAGES/DRESSINGS) ×2 IMPLANT
DURAPREP 26ML APPLICATOR (WOUND CARE) ×3 IMPLANT
ELECT REM PT RETURN 9FT ADLT (ELECTROSURGICAL) ×2
ELECTRODE REM PT RTRN 9FT ADLT (ELECTROSURGICAL) ×1 IMPLANT
GAUZE SPONGE 4X4 12PLY STRL LF (GAUZE/BANDAGES/DRESSINGS) ×1 IMPLANT
GAUZE XEROFORM 5X9 LF (GAUZE/BANDAGES/DRESSINGS) ×1 IMPLANT
GLOVE SURG SS PI 8.0 STRL IVOR (GLOVE) ×4 IMPLANT
GOWN EXTRA PROTECTION XL (GOWNS) ×2 IMPLANT
GOWN STRL NON-REIN LRG LVL3 (GOWN DISPOSABLE) ×6 IMPLANT
K-WIRE ACE 1.6X6 (WIRE) ×2
KIT BASIN OR (CUSTOM PROCEDURE TRAY) ×2 IMPLANT
KIT ROOM TURNOVER OR (KITS) ×2 IMPLANT
KWIRE ACE 1.6X6 (WIRE) IMPLANT
MANIFOLD NEPTUNE II (INSTRUMENTS) ×2 IMPLANT
NS IRRIG 1000ML POUR BTL (IV SOLUTION) ×2 IMPLANT
PACK ORTHO EXTREMITY (CUSTOM PROCEDURE TRAY) ×2 IMPLANT
PAD ARMBOARD 7.5X6 YLW CONV (MISCELLANEOUS) ×4 IMPLANT
PAD CAST 4YDX4 CTTN HI CHSV (CAST SUPPLIES) ×2 IMPLANT
PADDING CAST COTTON 4X4 STRL (CAST SUPPLIES) ×4
PADDING WEBRIL 4 STERILE (GAUZE/BANDAGES/DRESSINGS) ×1 IMPLANT
PLATE ACE 100DEG 7HOLE (Plate) ×1 IMPLANT
SCOTCHCAST PLUS 4X4 WHITE (CAST SUPPLIES) ×1 IMPLANT
SCREW ACE CAN 4.0 40M (Screw) ×2 IMPLANT
SCREW CORTICAL 3.5MM 14MM (Screw) ×1 IMPLANT
SCREW CORTICAL 3.5MM 24MM (Screw) ×1 IMPLANT
SCREW NLOCK CANC HEX 4X12 (Screw) ×1 IMPLANT
SCREW NLOCK CANC HEX 4X16 (Screw) ×2 IMPLANT
SLEEVE SURGEON STRL (DRAPES) ×1 IMPLANT
SPONGE GAUZE 4X4 12PLY (GAUZE/BANDAGES/DRESSINGS) ×2 IMPLANT
SPONGE LAP 4X18 X RAY DECT (DISPOSABLE) ×4 IMPLANT
STAPLER SKIN 35 REG (STAPLE) ×2 IMPLANT
SUCTION FRAZIER TIP 10 FR DISP (SUCTIONS) ×2 IMPLANT
SUT VIC AB 2-0 CTB1 (SUTURE) ×8 IMPLANT
TOWEL OR 17X24 6PK STRL BLUE (TOWEL DISPOSABLE) ×2 IMPLANT
TOWEL OR 17X26 10 PK STRL BLUE (TOWEL DISPOSABLE) ×3 IMPLANT
TUBE CONNECTING 12X1/4 (SUCTIONS) ×2 IMPLANT
WATER STERILE IRR 1000ML POUR (IV SOLUTION) ×2 IMPLANT
YANKAUER SUCT BULB TIP NO VENT (SUCTIONS) IMPLANT
Ziptight Ankle Syndesmosis Fixation System ×1 IMPLANT

## 2012-01-16 NOTE — Brief Op Note (Signed)
01/16/2012  6:25 PM  PATIENT:  Taylor Gay  66 y.o. female  PRE-OPERATIVE DIAGNOSIS:  fracture  POST-OPERATIVE DIAGNOSIS:  fracture left ankle  PROCEDURE:  Procedure(s): OPEN REDUCTION INTERNAL FIXATION (ORIF) ANKLE FRACTURE  SURGEON:  Surgeon(s): Javier Docker, MD  PHYSICIAN ASSISTANT:   ASSISTANTS: Kovach   ANESTHESIA:   general  EBL:  Total I/O In: 2150 [I.V.:2150] Out: 50 [Blood:50]  BLOOD ADMINISTERED:none  DRAINS: none   LOCAL MEDICATIONS USED:  NONE  SPECIMEN:  No Specimen  DISPOSITION OF SPECIMEN:  N/A  COUNTS:  YES  TOURNIQUET:   Total Tourniquet Time Documented: Thigh (Left) - 90 minutes  DICTATION: number [id number]  PLAN OF CARE: Admit to inpatient   PATIENT DISPOSITION:  PACU - hemodynamically stable.   Delay start of Pharmacological VTE agent (>24hrs) due to surgical blood loss or risk of bleeding:  {YES/NO/NOT APPLICABLE:20182

## 2012-01-16 NOTE — Anesthesia Preprocedure Evaluation (Signed)
Anesthesia Evaluation  Patient identified by MRN, date of birth, ID band Patient awake    Reviewed: Allergy & Precautions, H&P , NPO status , Patient's Chart, lab work & pertinent test results  Airway Mallampati: I TM Distance: >3 FB Neck ROM: Full    Dental  (+) Teeth Intact and Dental Advisory Given   Pulmonary  clear to auscultation        Cardiovascular Regular Normal    Neuro/Psych    GI/Hepatic   Endo/Other    Renal/GU      Musculoskeletal   Abdominal   Peds  Hematology   Anesthesia Other Findings   Reproductive/Obstetrics                           Anesthesia Physical Anesthesia Plan  ASA: II  Anesthesia Plan: General   Post-op Pain Management:    Induction: Intravenous  Airway Management Planned:   Additional Equipment:   Intra-op Plan:   Post-operative Plan: Extubation in OR  Informed Consent: I have reviewed the patients History and Physical, chart, labs and discussed the procedure including the risks, benefits and alternatives for the proposed anesthesia with the patient or authorized representative who has indicated his/her understanding and acceptance.   Dental advisory given  Plan Discussed with: CRNA, Anesthesiologist and Surgeon  Anesthesia Plan Comments:         Anesthesia Quick Evaluation

## 2012-01-16 NOTE — ED Notes (Signed)
Pt presents to ED w/obvious L ankle deformity, pt reports she was walking on the grass in her parents yard when she slipped on the ice twisting her L ankle. Extremity splinted en route by [facility]

## 2012-01-16 NOTE — Transfer of Care (Signed)
Immediate Anesthesia Transfer of Care Note  Patient: Taylor Gay  Procedure(s) Performed:  OPEN REDUCTION INTERNAL FIXATION (ORIF) ANKLE FRACTURE  Patient Location: PACU  Anesthesia Type: General and GA combined with regional for post-op pain  Level of Consciousness: awake, sedated, patient cooperative and responds to stimulation  Airway & Oxygen Therapy: Patient Spontanous Breathing and Patient connected to nasal cannula oxygen  Post-op Assessment: Report given to PACU RN, Post -op Vital signs reviewed and stable, Patient moving all extremities and Patient moving all extremities X 4  Post vital signs: Reviewed and stable  Complications: No apparent anesthesia complications

## 2012-01-16 NOTE — ED Notes (Signed)
Family at bedside. ekg given to [facility]

## 2012-01-16 NOTE — Anesthesia Procedure Notes (Addendum)
Anesthesia Regional Block:  Popliteal block  Pre-Anesthetic Checklist: ,, timeout performed, Correct Patient, Correct Site, Correct Laterality, Correct Procedure, Correct Position, site marked, Risks and benefits discussed,  Surgical consent,  Pre-op evaluation,  At surgeon's request and post-op pain management   Prep: chloraprep       Needles:  Injection technique: Single-shot  Needle Type: Echogenic Needle     Needle Length: 9cm  Needle Gauge: 22 and 22 G    Additional Needles:  Procedures: ultrasound guided Popliteal block Narrative:  Start time: 01/16/2012 3:26 PM End time: 01/16/2012 3:35 PM Injection made incrementally with aspirations every 5 mL.  Performed by: Personally  Anesthesiologist: Sheldon Silvan, MD  Additional Notes: Marcaine 0.5% with EPI 1:200000  Popliteal block Procedure Name: Intubation Date/Time: 01/16/2012 4:02 PM Performed by: Wray Kearns A Pre-anesthesia Checklist: Patient identified, Timeout performed, Emergency Drugs available, Suction available and Patient being monitored Patient Re-evaluated:Patient Re-evaluated prior to inductionOxygen Delivery Method: Circle System Utilized Preoxygenation: Pre-oxygenation with 100% oxygen Intubation Type: IV induction and Cricoid Pressure applied Ventilation: Mask ventilation without difficulty Laryngoscope Size: Mac and 3 Grade View: Grade I Tube type: Oral Tube size: 7.0 mm Number of attempts: 1 Airway Equipment and Method: stylet Placement Confirmation: ETT inserted through vocal cords under direct vision,  breath sounds checked- equal and bilateral and positive ETCO2 Secured at: 21 cm Tube secured with: Tape Dental Injury: Teeth and Oropharynx as per pre-operative assessment

## 2012-01-16 NOTE — Anesthesia Postprocedure Evaluation (Signed)
  Anesthesia Post-op Note  Patient: Taylor Gay  Procedure(s) Performed:  OPEN REDUCTION INTERNAL FIXATION (ORIF) ANKLE FRACTURE  Patient Location: PACU  Anesthesia Type: GA combined with regional for post-op pain  Level of Consciousness: awake, alert  and oriented  Airway and Oxygen Therapy: Patient Spontanous Breathing and Patient connected to nasal cannula oxygen  Post-op Pain: mild  Post-op Assessment: Post-op Vital signs reviewed, Patient's Cardiovascular Status Stable, Respiratory Function Stable, Patent Airway and Pain level controlled  Post-op Vital Signs: Reviewed and stable  Complications: No apparent anesthesia complications

## 2012-01-16 NOTE — Preoperative (Signed)
Beta Blockers   Reason not to administer Beta Blockers:Not Applicable 

## 2012-01-16 NOTE — ED Notes (Signed)
MD at bedside.EDP Campos 

## 2012-01-16 NOTE — ED Notes (Signed)
Pt to ED via [facility], pt walking through yard, fell on ice twisting her L ankle, obvious deformity, closed fracture, hx HTN, 20 g LLW, Fentanyl 250 mcg, Zofran 4 mg given en route.

## 2012-01-16 NOTE — Progress Notes (Signed)
Orthopedic Tech Progress Note Patient Details:  Taylor Gay 1946/08/17 [id number]  Other Ortho Devices Type of Ortho Device: Other (comment) (bi valve cast) Ortho Device Location: left leg bi valve Ortho Device Interventions: Application Viewed order from rn order list  Nikki Dom 01/16/2012, 7:56 PM

## 2012-01-16 NOTE — Progress Notes (Signed)
Orthopedic Tech Progress Note Patient Details:  Taylor Gay Mar 26, 1946 [id number]  Other Ortho Devices Type of Ortho Device: Other (comment) (bi valve cast) Ortho Device Location: left leg bi valve Ortho Device Interventions: Application   Hadar Elgersma 01/16/2012, 7:56 PM

## 2012-01-16 NOTE — ED Notes (Signed)
Patient transported to X-ray 
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
## 2012-01-16 NOTE — Progress Notes (Signed)
Orthopedic Tech Progress Note Patient Details:  Taylor Gay 1946/12/04 [id number]  Type of Splint: Post (short);Stirrup Splint Interventions: Application    Cammer, Mickie Bail 01/16/2012, 12:56 PM

## 2012-01-16 NOTE — Op Note (Signed)
NAMEMarland Kitchen  Taylor, Gay NO.:  [id number]  MEDICAL RECORD NO.:  [id number]  LOCATION:  5018                         FACILITY:  [facility]  PHYSICIAN:  Jene Every, M.D.    DATE OF BIRTH:  Jan 20, 1946  DATE OF PROCEDURE: DATE OF DISCHARGE:                              OPERATIVE REPORT   PREOPERATIVE DIAGNOSIS:  Trimalleolar ankle fracture with rupture of the syndesmosis, left.  POSTOPERATIVE DIAGNOSES:  Trimalleolar ankle fracture with rupture of the syndesmosis, left; posterior tibial tendon tear.  PROCEDURE PERFORMED: 1. Open reduction and internal fixation of trimalleolar ankle     fracture. 2. Repair of syndesmosis, distal tib-fib. 3. Repair of posterior tibial tendon. 4. Stress radiographs under anesthesia.  HISTORY:  A 66 year old sustaining above-mentioned injury.  She was indicated for open reduction and internal fixation of this fracture. She was reduced in the emergency room, placed in a splint, and seen in the preoperative area.  She had pain.  She had capillary refill, which reported to be intact and basically was flexing and extending the digits limited secondary to pain.  We discussed open reduction and internal fixation.  Risks and benefits were discussed including bleeding, infection, damage to vascular structures, malunion, nonunion, need for revision, DVT, PE, anesthetic complications, etc.  She had severe fracture dislocation, which remained in the dislocation for a period of time prior to the reduction in the emergency room.  She had tenting of the skin medially prior to their reduction.  TECHNIQUE:  With the patient in supine position, after induction of adequate general anesthesia and 600 clindamycin and a gram of Kefzol with history of a MRSA, the left lower extremity was prepped and draped in usual sterile fashion.  First, an incision was made over the lateral aspect of the distal fibula.  Subcutaneous tissue was dissected, electrocautery  was utilized to achieve hemostasis.  Care was taken to preserve the branches of the peroneal nerve.  We identified the fracture site, irrigated that.  The hematoma from an oblique fracture of the distal fibula was reduced with a tenaculum and fixed with an inner fragmentary screw after over drilling  insertion of the appropriate length screw with excellent purchase and expression of the fracture hematoma.  I then placed a 6-hole third tubular plate, contouring it over the fibula, bisecting it posteriorly to be able to use the syndesmosis screw and then also not interfering with the inner fragmentary screw.  This was fixed proximally with 3 fully-threaded cortical screws at the appropriate drilling depth gauge measurement insertion of the appropriate length screw.  Partially 2 fully-threaded cancellous screws after drilling and measurement, insertion of the screws with excellent purchase both distally and proximally.  The hole of the plate above the joint and below the fracture was preserved for the syndesmosis.  Attention was turned medially.  There was an area over the medial malleolus of the fracture was to 1.5 cm of attenuated skin. There was no evidence of an open fracture, however.  I made my medial malleolar incision just posterior to that.  Curvilinear in nature.  That was identified the fracture site.  There was stripping of the periosteum.  Fracture hematoma, which was  expressed through the comminution posteriorly and just posterior to the malleolus with the retinaculum torn and what appeared to be 30% tear of the posterior tibial tendon.  There was significant soft tissue avulsion there medially consistent with her initial prereduction films.  I removed the periosteum, reduced it with a tenaculum, and then placed 2 cannulated K- wires at the tip and just anterior to that parallel with the parallel guide.  I placed a partially-threaded screw, 40 mm in length, and inserted that  with excellent purchase and then placed 1 anteriorly.  I looked into the joint.  There was some comminution and very little bone there to accept a screw.  That may be the not be enough and therefore I checked posteriorly.  Again, there was comminution of the medial malleolus posteriorly and with the soft tissue disruption of the retinaculum, I do not feel there was purchase there for a screw.  I therefore felt that the only available option was a K-wire just anterior to the initial screw that was placed and this was placed as a derotation maneuver and then bent over the distal tip and impacted with a bone tamp.  Then, we turned our attention back towards the repair of the syndesmosis, which was disrupted.  Clamped the fibula and the tibia together and under x-ray used the drill and drilled from the posterior lateral aspect to the anterior medial aspect under x-ray parallel to the joint between the fixation medially through the cortex and then we threaded this zip tie Biomet device threading through there to the medial side, engaging the medial side and then cinching down the button on the tubular plate in the appropriate fashion and then removed the redundant suture.  This was clipped in the AP and lateral plane and was satisfactory.  Stress radiographs under anesthesia revealed no widening of the mortise.  It was stable to digital palpation as well.  Wound copiously irrigated bilaterally.  Then, released the tourniquet and checked medially in the area where there was significant soft tissue damage.  I did not see any active bleeding.  I did place a couple of 2-0 Vicryl sutures into the posterior tibial tendon area and the retinaculum and then closed the medial malleolar wound with separate 3-0 and 4-0 simple sutures as there was no subcutaneous tissue and significant periosteal stripping.  The area of the abrasion of the skin there was dressed with Xeroform, laterally we closed with 2-0  Vicryl simple sutures and the skin was reapproximated with staples.  Care was taken not to incarcerate the branch of the peroneal nerve.  Sterile dressing was applied.  The tourniquet had been deflated and there was adequate revascularization of the lower extremity appreciated.  A short-leg cast was placed, well molded and well padded in the neutral position.  Next, the patient then was extubated without difficulty.  Prior to final closure, we copiously irrigated with antibiotic irrigation.  She was then extubated without difficulty and transported to recovery in satisfactory condition.  The patient tolerated the procedure well.  There were no complications. Assistant was Norval Gable, [place], who helped in maintaining the reduction and in fixation.  TOURNIQUET TIME:  90 minutes.     Jene Every, M.D.     Taylor Gay  D:  01/16/2012  T:  01/16/2012  Job:  [id number]
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
## 2012-01-17 NOTE — Progress Notes (Signed)
Subjective: She is doing ok this morning. She states that her pain is well-controlled with Percocet. She denies shortness of breath and chest pain. She reports that she has not worked with therapy yet.   Objective: Vital signs in last 24 hours: Temp:  [97.7 F (36.5 C)-98.7 F (37.1 C)] 98.7 F (37.1 C) (01/27 0520) Pulse Rate:  [59-129] 70  (01/27 0520) Resp:  [9-20] 16  (01/27 0520) BP: (90-145)/(53-75) 137/72 mmHg (01/27 0520) SpO2:  [96 %-100 %] 98 % (01/27 0520) Weight:  [91.536 kg (201 lb 12.8 oz)] 91.536 kg (201 lb 12.8 oz) (01/26 2005)  Intake/Output from previous day: 01/26 0701 - 01/27 0700 In: 3210 [P.O.:1060; I.V.:2150] Out: 50 [Blood:50]   Basename 01/16/12 1008  HGB 13.7    Basename 01/16/12 1008  WBC 10.9*  RBC 4.64  HCT 39.1  PLT 262    Basename 01/16/12 1008  NA 138  K 2.6*  CL 101  CO2 20  BUN 31*  CREATININE 0.90  GLUCOSE 193*  CALCIUM 8.9    Neurologically intact Neurovascular intact Moves toes well  Assessment/Plan: Up with therapy today DC IV Possible DC tomorrow based on progress today   Taylor Gay LAUREN 01/17/2012, 10:16 AM
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
## 2012-01-17 NOTE — Progress Notes (Signed)
   CARE MANAGEMENT NOTE 01/17/2012  Patient:  Taylor Gay, Taylor Gay   Account Number:  [id number]  Date Initiated:  01/17/2012  Documentation initiated by:  [facility]  Subjective/Objective Assessment:   OPEN REDUCTION INTERNAL FIXATION (ORIF) ANKLE FRACTURE     Action/Plan:   Anticipated DC Date:  01/18/2012   Anticipated DC Plan:  HOME W HOME HEALTH SERVICES      DC Planning Services  CM consult      Choice offered to / List presented to:     DME arranged  3-N-1  Levan Hurst      DME agency  [facility]        Status of service:  In process, will continue to follow Medicare Important Message given?   (If response is "NO", the following Medicare IM given date fields will be blank) Date Medicare IM given:   Date Additional Medicare IM given:    Discharge Disposition:    Per UR Regulation:    Comments:  01/17/2012 1130 Pt states she will need 3n1 for home. States her commode seats at home are extremely low. Notified MD of pt's request for 3n1 for home. Pt is scheduled for follow up with PT/OT and will wait recommendations for [facility]. Contacted [facility] for DME for home. Isidoro Donning RN CCM Case Mgmt phone [phone]

## 2012-01-18 MED ORDER — ASPIRIN 325 MG PO TABS: 325.0000 mg | ORAL_TABLET | Freq: Two times a day (BID) | ORAL | Status: DC

## 2012-01-18 MED ORDER — OXYCODONE-ACETAMINOPHEN 5-325 MG PO TABS: 1.0000 | ORAL_TABLET | ORAL | Status: AC | PRN

## 2012-01-18 MED ORDER — FAMOTIDINE 20 MG PO TABS: 20.0000 mg | ORAL_TABLET | Freq: Two times a day (BID) | ORAL | Status: DC

## 2012-01-18 MED ORDER — METHOCARBAMOL 500 MG PO TABS: 500.0000 mg | ORAL_TABLET | Freq: Four times a day (QID) | ORAL | Status: AC | PRN

## 2012-01-18 MED FILL — Cefazolin in D5W Inj 1 GM/50ML: INTRAVENOUS | Qty: 50 | Status: AC

## 2012-01-18 NOTE — Progress Notes (Signed)
Subjective: 2 Days Post-Op Procedure(s) (LRB): OPEN REDUCTION INTERNAL FIXATION (ORIF) ANKLE FRACTURE (Left) Patient reports pain as 5 on 0-10 scale.   Denies CP or SOB.  Voiding without difficulty. Positive flatus. Objective: Vital signs in last 24 hours: Temp:  [97.9 F (36.6 C)-98.4 F (36.9 C)] 97.9 F (36.6 C) (01/28 0542) Pulse Rate:  [73-78] 78  (01/28 0542) Resp:  [16-18] 16  (01/28 0542) BP: (114-127)/(49-74) 114/71 mmHg (01/28 0542) SpO2:  [96 %-100 %] 96 % (01/28 0542)  Intake/Output from previous day: 01/27 0701 - 01/28 0700 In: 720 [P.O.:720] Out: -  Intake/Output this shift:     Basename 01/16/12 1008  HGB 13.7    Basename 01/16/12 1008  WBC 10.9*  RBC 4.64  HCT 39.1  PLT 262    Basename 01/16/12 1008  NA 138  K 2.6*  CL 101  CO2 20  BUN 31*  CREATININE 0.90  GLUCOSE 193*  CALCIUM 8.9   No results found for this basename: LABPT:2,INR:2 in the last 72 hours  Neurologically intact Neurovascular intact Sensation intact distally Compartment soft cast well fitted  Assessment/Plan: 2 Days Post-Op Procedure(s) (LRB): OPEN REDUCTION INTERNAL FIXATION (ORIF) ANKLE FRACTURE (Left) Discharge home with home health pending repeat K If K below 3 will restart IV for K runs since pt has such a hard time with po form Cont PT/OT ASA for DVT prevention  Ivon Oelkers R. 01/18/2012, 8:08 AM

## 2012-01-18 NOTE — Progress Notes (Signed)
CARE MANAGEMENT NOTE 01/18/2012 [facility] doesnt contract with [facility] for [facility], will offer other choices. [facility] will provde therapy, notified patient of same.

## 2012-01-18 NOTE — Progress Notes (Signed)
Physical Therapy Treatment Patient Details Name: Taylor Gay MRN: [id number] DOB: 10/19/1946 Today's Date: 01/18/2012  PT Assessment/Plan  PT - Assessment/Plan Comments on Treatment Session: Pt admitted s/p ORIF for left ankle fracture.  Pt progressing great and is ready for safe d/c home with 24 hour assist by friends.  Will follow. PT Plan: Discharge plan remains appropriate;Frequency remains appropriate PT Frequency: Min 6X/week Follow Up Recommendations: No PT follow up Equipment Recommended: Rolling walker with 5" wheels;3 in 1 bedside comode PT Goals  Acute Rehab PT Goals PT Goal Formulation: With patient Time For Goal Achievement: 7 days PT Goal: Sit to Stand - Progress: Progressing toward goal PT Goal: Stand to Sit - Progress: Progressing toward goal PT Goal: Ambulate - Progress: Progressing toward goal  PT Treatment Precautions/Restrictions  Precautions Precautions: Fall Required Braces or Orthoses: No Restrictions Weight Bearing Restrictions: Yes LLE Weight Bearing: Touchdown weight bearing Pain 0/10 with treatment. Mobility (including Balance) Bed Mobility Bed Mobility: Yes Supine to Sit: 7: Independent Sit to Supine: Not Tested (comment) Transfers Transfers: Yes Sit to Stand: 5: Supervision;With upper extremity assist;From bed Sit to Stand Details (indicate cue type and reason): Verbal cues for safest hand placement. Stand to Sit: 5: Supervision;With upper extremity assist;To chair/3-in-1 Stand to Sit Details: Verbal cues for safest hand placement. Stand Pivot Transfers: Not tested (comment) Ambulation/Gait Ambulation/Gait: Yes Ambulation/Gait Assistance: 5: Supervision Ambulation/Gait Assistance Details (indicate cue type and reason): Verbal cues for safe sequence with cues for Touch Down Weight Bearing left LE.  Pt with tendency to perform NWBing left LE during gait. Ambulation Distance (Feet): 150 Feet Assistive device: Rolling walker Gait Pattern:  Step-to pattern;Decreased step length - right Stairs: No Wheelchair Mobility Wheelchair Mobility: No  Posture/Postural Control Posture/Postural Control: No significant limitations Balance Balance Assessed: No Exercise  General Exercises - Lower Extremity Long Arc Quad: AROM;Left;10 reps;Seated End of Session PT - End of Session Equipment Utilized During Treatment: Gait belt Activity Tolerance: Patient tolerated treatment well Patient left: in chair;with call bell in reach Nurse Communication: Mobility status for transfers;Mobility status for ambulation General Behavior During Session: [facility] for tasks performed Cognition: [facility] for tasks performed  Cephus Shelling 01/18/2012, 10:38 AM  01/18/2012 Cephus Shelling, PT, DPT [phone]
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
## 2012-01-19 ENCOUNTER — Encounter (HOSPITAL_COMMUNITY): Payer: Self-pay | Admitting: Specialist

## 2012-03-21 ENCOUNTER — Ambulatory Visit (INDEPENDENT_AMBULATORY_CARE_PROVIDER_SITE_OTHER): Payer: 59 | Admitting: Family Medicine

## 2012-03-21 ENCOUNTER — Encounter: Payer: Self-pay | Admitting: Family Medicine

## 2012-03-21 VITALS — BP 147/101 | HR 68 | Temp 98.2°F | Resp 16 | Ht 68.0 in | Wt 187.0 lb

## 2012-03-21 DIAGNOSIS — Z0289 Encounter for other administrative examinations: Secondary | ICD-10-CM
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
## 2012-03-22 LAB — LIPID PANEL
HDL: 60 mg/dL (ref >39)
LDL Cholesterol: 119 mg/dL — ABNORMAL HIGH (ref 0–99)
Triglycerides: 59 mg/dL (ref <150)
VLDL: 12 mg/dL (ref 0–40)

## 2012-03-22 LAB — BASIC METABOLIC PANEL
Calcium: 10.1 mg/dL (ref 8.4–10.5)
Creat: 0.96 mg/dL (ref 0.50–1.10)
Sodium: 141 mEq/L (ref 135–145)

## 2012-03-25 ENCOUNTER — Telehealth: Payer: Self-pay

## 2012-03-25 NOTE — Telephone Encounter (Signed)
Called patient and let her know that we faxed the lab results to [facility] and to her.

## 2012-03-25 NOTE — Telephone Encounter (Signed)
PT STATES SHE WAS SEEN BY DR Georgiana Shore AND HAD BLOOD WORK DONE. WE WERE SUPPOSE TO FAX A COPY OF THE RESULTS TO [facility] AND ALSO A COPY TO HER SHE HASN'T HEARD FROM ANYONE. PLEASE FAX TO [facility] AT [phone] AND HER FAX IS [phone] YOU MAY REACH PT AT [phone]

## 2012-05-02 ENCOUNTER — Encounter: Payer: Self-pay | Admitting: Family Medicine

## 2012-05-10 ENCOUNTER — Encounter: Payer: Self-pay | Admitting: Family Medicine

## 2012-05-24 ENCOUNTER — Ambulatory Visit (INDEPENDENT_AMBULATORY_CARE_PROVIDER_SITE_OTHER): Payer: 59 | Admitting: Family Medicine

## 2012-05-24 VITALS — BP 161/125 | HR 65 | Temp 97.0°F | Resp 18 | Ht 67.5 in | Wt 188.0 lb

## 2012-05-24 DIAGNOSIS — R002 Palpitations: Secondary | ICD-10-CM

## 2012-05-24 DIAGNOSIS — I10 Essential (primary) hypertension: Secondary | ICD-10-CM

## 2012-05-24 DIAGNOSIS — L719 Rosacea, unspecified: Secondary | ICD-10-CM

## 2012-05-24 LAB — POCT CBC
Granulocyte percent: 58.9 %G (ref 37–80)
HCT, POC: 40.2 % (ref 37.7–47.9)
Hemoglobin: 12.8 g/dL (ref 12.2–16.2)
POC Granulocyte: 4 (ref 2–6.9)
POC LYMPH PERCENT: 35.9 %L (ref 10–50)
RBC: 4.71 M/uL (ref 4.04–5.48)
RDW, POC: 15.7 %

## 2012-05-24 MED ORDER — TRIAMTERENE-HCTZ 75-50 MG PO TABS: 1.0000 | ORAL_TABLET | Freq: Every day | ORAL | Status: DC

## 2012-05-24 MED ORDER — DOXYCYCLINE HYCLATE 100 MG PO TABS: 100.0000 mg | ORAL_TABLET | Freq: Every day | ORAL | Status: DC

## 2012-05-24 MED ORDER — TETRACYCLINE HCL 500 MG PO CAPS: 500.0000 mg | ORAL_CAPSULE | Freq: Every morning | ORAL | Status: DC

## 2012-05-24 NOTE — Progress Notes (Signed)
Subjective:    Patient ID: Taylor Gay, female    DOB: 1946/12/01, 66 y.o.   MRN: [id number]  HPI Taylor Gay is a 66 y.o. female HTN - off meds today - last dose yesterday - only took 1/2 dose.  On meds,  outside blood pressures have been running a little higher recently - 130's over 100's. No new symptoms, but episodic feeling that heart is "jumping out of chest" - see below.   Prior primary care - Dr. [name] - transferring here due to due to distance. Stressful year - Mom deceased in 2023/02/02, then ankle fracture January 26th - just back to walking past 4 weeks.    rosacea - controlled with tetracycline 500mg  oncer per day. Occasional metro lotion. No recent new side effects with meds.   Review of Systems  Constitutional: Negative for unexpected weight change.       Since surgery.   Respiratory: Negative for chest tightness and shortness of breath.   Cardiovascular: Negative for chest pain, palpitations and leg swelling.       Rare heart beats faster or harder, lasts about . Rare - every few months.    Gastrointestinal: Negative for abdominal pain and blood in stool.  Neurological: Negative for dizziness, syncope, light-headedness and headaches.       Balance difficulty in past.    Taylor Gay each day - not recommended by GI.     Objective:   Physical Exam  Constitutional: She is oriented to person, place, and time. She appears well-developed and well-nourished.  HENT:  Head: Normocephalic and atraumatic.  Eyes: Conjunctivae and EOM are normal. Pupils are equal, round, and reactive to light.  Neck: Carotid bruit is not present.  Cardiovascular: Normal rate, regular rhythm, normal heart sounds and intact distal pulses.   No extrasystoles are present. PMI is not displaced.   No murmur heard. Pulmonary/Chest: Effort normal and breath sounds normal.  Abdominal: Soft. She exhibits no pulsatile midline mass. There is no tenderness.  Neurological: She is alert and  oriented to person, place, and time.  Skin: Skin is warm and dry.       faint erythematous rash - nose - malar face.    Psychiatric: She has a normal mood and affect. Her behavior is normal.   EKG: artifact in V1 - no acute findings. Results for orders placed in visit on 05/24/12  POCT CBC      Component Value Range   WBC 6.8  4.6 - 10.2 (K/uL)   Lymph, poc 2.4  0.6 - 3.4    POC LYMPH PERCENT 35.9  10 - 50 (%L)   MID (cbc) 0.4  0 - 0.9    POC MID % 5.2  0 - 12 (%M)   POC Granulocyte 4.0  2 - 6.9    Granulocyte percent 58.9  37 - 80 (%G)   RBC 4.71  4.04 - 5.48 (M/uL)   Hemoglobin 12.8  12.2 - 16.2 (g/dL)   HCT, POC 40.9  81.1 - 47.9 (%)   MCV 85.4  80 - 97 (fL)   MCH, POC 27.2  27 - 31.2 (pg)   MCHC 31.8  31.8 - 35.4 (g/dL)   RDW, POC 91.4     Platelet Count, POC 291  142 - 424 (K/uL)   MPV 8.7  0 - 99.8 (fL)       Assessment & Plan:  AIRICA SCHWARTZKOPF is a 66 y.o. female 1. HTN (hypertension)  triamterene-hydrochlorothiazide (MAXZIDE) 75-50  MG per tablet, EKG 12-Lead, Basic metabolic panel, TSH  2. Rosacea  tetracycline (ACHROMYCIN,SUMYCIN) 500 MG capsule  3. Palpitations  EKG 12-Lead, Basic metabolic panel, TSH   Check outside BP's, if >140/90, need to add med.  Continue same dose as prior for now.  ER/911 MI/CVA precautions reviewed.   Palpitations possibly PVC's vs PAF.  Check TSH, CBC.  Consider cards referral for echo and possible holter.  Rosacea stable - cont same meds.   Recheck in next 1 month. Plans to schedule appt with new primary provider at [facility], and plan physical in next 6 months.

## 2012-05-24 NOTE — Patient Instructions (Addendum)
Keep a record of your blood pressures outside of the office and bring them to the next office visit. Your should receive a call or letter about your lab results within the next week to 10 days.  Return to the clinic or go to the nearest emergency room if any of your symptoms worsen or new symptoms occur. To look up more info on your condition, go to the website [email or web address], then on patient resources - select [facility]. Under patient resources, select hypertension, rosacea, and palpitations.

## 2012-05-25 LAB — BASIC METABOLIC PANEL
BUN: 27 mg/dL — ABNORMAL HIGH (ref 6–23)
CO2: 26 mEq/L (ref 19–32)
Calcium: 9.5 mg/dL (ref 8.4–10.5)
Creat: 0.89 mg/dL (ref 0.50–1.10)
Glucose, Bld: 89 mg/dL (ref 70–99)

## 2012-05-26 ENCOUNTER — Telehealth: Payer: Self-pay

## 2012-05-26 NOTE — Telephone Encounter (Signed)
Pt states saw dr Neva Seat recently. He rx'd tetracycline but pharmacy did not have it so she received doxycycline. Pt states she has been extremely nauseous and had stomach pain keeping her up at night b/c of rx. States she did take it with food as instructed.  Did not take rx today b/c of side effects. wants to know if dr Neva Seat can rx the tetracycline to a different pharmacy hoping it will sit better with her.   Pt best: [phone] [facility] on [address]

## 2012-05-26 NOTE — Telephone Encounter (Signed)
Dr. Neva Seat, please address.

## 2012-05-26 NOTE — Telephone Encounter (Signed)
Tetracycline is no longer available per pharmacy-- since Doxy is upsetting patients stomach, can we recommend anything else?

## 2012-05-27 MED ORDER — DOXYCYCLINE HYCLATE 50 MG PO CAPS: 50.0000 mg | ORAL_CAPSULE | Freq: Every day | ORAL | Status: AC

## 2012-05-27 NOTE — Telephone Encounter (Signed)
Pt asked if we could try to order the tetracycline at [facility] to see if it is available there and Rx that if possible. If not, she would like to try the Doxy 50 QD. Called [facility] and they do not have the tetracycline and have not for some time, so they are not sure it is available anymore. Called in Rx for Doxy 50 mg 1 QD #90 + 1RF per Dr Paralee Cancel instructions. Pt is aware that Rx is being called in.

## 2012-05-27 NOTE — Telephone Encounter (Signed)
Can she try just the metronidazole lotion topically?  We could try to decrease the doxycycline to 50mg  QD.  Either option is ok.

## 2012-06-03 ENCOUNTER — Telehealth: Payer: Self-pay

## 2012-06-03 NOTE — Telephone Encounter (Signed)
Patient saw Dr. Neva Seat a couple of weeks ago and had lab work and has not heard any lab results yet but she wants to start getting B12 shots.

## 2012-06-04 NOTE — Telephone Encounter (Signed)
All labs look ok except her BUN is still slightly elevated as it has been in the past.  There was no mention of B12 labs or injections in note.  This will have to be discussed with Dr. Neva Seat.

## 2012-06-04 NOTE — Telephone Encounter (Signed)
Please review labs. 

## 2012-06-05 NOTE — Telephone Encounter (Signed)
LMOM TO CB 

## 2012-06-05 NOTE — Telephone Encounter (Signed)
PT WOULD LIKE TO GET HER B12 LEVEL CHECKED SHE HAS BEEN FEELING FATIGUED.  SHE ALSO WANTS TO KNOW WHAT SHE CAN DO FOR HER SLIGHTLY ELEVATED BUN

## 2012-06-09 NOTE — Telephone Encounter (Signed)
Pt notified that she must come in to discuss b12 injections

## 2012-06-09 NOTE — Telephone Encounter (Signed)
Call pt - BUN was borlerline elevated, but other kidney function test was ok.  Looking back - the BUN reading is stable from prior labs.  We did not check B12 with the concerns addressed last office visit, but she can return to discuss the fatigue and possible lab choices to work this up.

## 2012-07-01 ENCOUNTER — Ambulatory Visit: Payer: 59 | Admitting: Family Medicine

## 2012-08-05 ENCOUNTER — Ambulatory Visit (INDEPENDENT_AMBULATORY_CARE_PROVIDER_SITE_OTHER): Payer: 59 | Admitting: Family Medicine

## 2012-08-05 ENCOUNTER — Encounter: Payer: Self-pay | Admitting: Family Medicine

## 2012-08-05 VITALS — BP 155/78 | HR 67 | Temp 96.6°F | Resp 20 | Ht 67.5 in | Wt 190.0 lb

## 2012-08-05 DIAGNOSIS — K219 Gastro-esophageal reflux disease without esophagitis: Secondary | ICD-10-CM

## 2012-08-05 DIAGNOSIS — I10 Essential (primary) hypertension: Secondary | ICD-10-CM

## 2012-08-05 DIAGNOSIS — L52 Erythema nodosum: Secondary | ICD-10-CM

## 2012-08-05 DIAGNOSIS — K279 Peptic ulcer, site unspecified, unspecified as acute or chronic, without hemorrhage or perforation: Secondary | ICD-10-CM

## 2012-08-05 DIAGNOSIS — L719 Rosacea, unspecified: Secondary | ICD-10-CM

## 2012-08-05 DIAGNOSIS — N951 Menopausal and female climacteric states: Secondary | ICD-10-CM

## 2012-08-05 MED ORDER — ESOMEPRAZOLE MAGNESIUM 40 MG PO CPDR: 40.0000 mg | DELAYED_RELEASE_CAPSULE | Freq: Every day | ORAL | Status: DC

## 2012-08-05 MED ORDER — METRONIDAZOLE 0.75 % EX LOTN: TOPICAL_LOTION | CUTANEOUS | Status: DC

## 2012-08-05 MED ORDER — LOSARTAN POTASSIUM-HCTZ 100-25 MG PO TABS: 1.0000 | ORAL_TABLET | Freq: Every day | ORAL | Status: DC

## 2012-08-05 MED ORDER — ESTROGENS CONJUGATED 0.3 MG PO TABS: 0.3000 mg | ORAL_TABLET | Freq: Every day | ORAL | Status: DC

## 2012-08-05 MED ORDER — OMEPRAZOLE 20 MG PO CPDR: 20.0000 mg | DELAYED_RELEASE_CAPSULE | Freq: Every day | ORAL | Status: DC

## 2012-08-05 NOTE — Progress Notes (Signed)
Subjective:    Patient ID: Taylor Gay, female    DOB: [birth year], 66 y.o.   MRN: [id number]  HPI Multiple concerns today - has list provided of 12 concerns for today. Discussed need to address these over few visits - understanding expressed.   HTN - see last ov. BP's running a little higher.  Planned on follow up in month at June office visit.  Wants to change back to Hyzaar 100/25.  feels like had more energy on this medicine. Energy is down.  Had been taking premarin in past.  Would like to get back on premarin, low dose.  Terrible hot flushes several times per day, and burning in face. Hx of hysterectomy and oopherectomy - no known of hx of cancer.  Outside blood pressures - 140's over 96. Doesn't take meds on weekends at times.  - causes her to go to restroom too much.  Didn't tolerate calcium channel blockers or beta blockers - felt too tired.  Only 1 episode of palpitation since last office visit.   No early FH of early CAD, but mom had MI in 28's.  Has not had stress testing, and has not seen cardiologist. doesn't want to see cardiologist.    Heartburn -  - food gets stuck at times.  Unable to take Nexium due to cost.  Omeprazole upset stomach at 40mg  .  Feels like food gets stuck at times.  GI= [facility] .  Hx of PUD.   Rosacea - see last office visit and telephone calls. Tetracycline in short supply - unable to fill this.  Using metro lotion every other day.  Needs refill.  doxycycline upset stomach.  Plans on calling pharmacy intermittently- and if in stock has prescription on file at [facility] pharmacy.  Results for orders placed in visit on 05/24/12  BASIC METABOLIC PANEL      Component Value Range   Sodium 142  135 - 145 mEq/L   Potassium 4.3  3.5 - 5.3 mEq/L   Chloride 106  96 - 112 mEq/L   CO2 26  19 - 32 mEq/L   Glucose, Bld 89  70 - 99 mg/dL   BUN 27 (*) 6 - 23 mg/dL   Creat 4.09  8.11 - 9.14 mg/dL   Calcium 9.5  8.4 - 78.2 mg/dL  TSH      Component Value Range   TSH 2.431  0.350 -  4.500 uIU/mL  POCT CBC      Component Value Range   WBC 6.8  4.6 - 10.2 K/uL   Lymph, poc 2.4  0.6 - 3.4   POC LYMPH PERCENT 35.9  10 - 50 %L   MID (cbc) 0.4  0 - 0.9   POC MID % 5.2  0 - 12 %M   POC Granulocyte 4.0  2 - 6.9   Granulocyte percent 58.9  37 - 80 %G   RBC 4.71  4.04 - 5.48 M/uL   Hemoglobin 12.8  12.2 - 16.2 g/dL   HCT, POC 95.6  21.3 - 47.9 %   MCV 85.4  80 - 97 fL   MCH, POC 27.2  27 - 31.2 pg   MCHC 31.8  31.8 - 35.4 g/dL   RDW, POC 08.6     Platelet Count, POC 291  142 - 424 K/uL   MPV 8.7  0 - 99.8 fL     Review of Systems As above.     Objective:   Physical Exam  Constitutional: She is  oriented to person, place, and time. She appears well-developed and well-nourished.  HENT:  Head: Normocephalic and atraumatic.  Eyes: Conjunctivae and EOM are normal. Pupils are equal, round, and reactive to light.  Neck: Carotid bruit is not present.  Cardiovascular: Normal rate, regular rhythm, normal heart sounds and intact distal pulses.   Pulmonary/Chest: Effort normal and breath sounds normal.  Abdominal: Soft. She exhibits no pulsatile midline mass. There is no tenderness.  Neurological: She is alert and oriented to person, place, and time.  Skin: Skin is warm and dry.  Psychiatric: She has a normal mood and affect. Her behavior is normal.        Assessment & Plan:  Taylor Gay is a 66 y.o. female  HTN - uncontrolled. Trial of Hyzaar in place of Maxzide.  Start at previous dose of 100/25, but orhtostatic precautions discussed and to check outside BP's. Recheck in next 6 weeks.   Hot flushes- would like to restart HRT - discussed risks of heart disease, especially with family hx - understanding expressed of risks and would like to restart this medicine.  Declined eval by cardiologist, but would again recommend this if palpitations persist.  Rosacea - increase metro lotion to QD, and can start tetracycline when meds available.  rtc precautions.    GERD hx  of PUD.  - trial of omeprazole 20mg  qd for 4- 6 weeks if unable to fill rx for Nexium - rx of both given - only to take one or the other. Pt to follow up with Dr. Russella Dar  - she will call for appt.

## 2012-08-05 NOTE — Patient Instructions (Signed)
Stop triamterene/hctz.  Start hyzaar.  Keep a record of your blood pressures outside of the office and bring them to the next office visit. If any lightheadness, dizziness - return to clinic or the emergency room.  See the paperwork about hormone therapy. You can start the low dose of this for now.  We can talk about this further at your next office visit.  Metronidazole lotion each day, and can start tetracycline when available.  Call Dr. Russella Dar to schedule a follow up on your heartburn and ulcers.  Take either nexium OR omeprazole.  Return to the clinic or go to the nearest emergency room if any of your symptoms worsen or new symptoms occur.

## 2012-08-09 ENCOUNTER — Telehealth: Payer: Self-pay

## 2012-08-09 DIAGNOSIS — Z8249 Family history of ischemic heart disease and other diseases of the circulatory system: Secondary | ICD-10-CM

## 2012-08-09 DIAGNOSIS — R002 Palpitations: Secondary | ICD-10-CM

## 2012-08-09 NOTE — Telephone Encounter (Signed)
Pt is calling to let dr Neva Seat know that she does now want to go ahead with the cardiology referral and also when she seen him Friday he did not give her a refill on percocet and she would like it called into [facility] and she will pick it up this  Weekend  Best number [phone]

## 2012-08-10 NOTE — Telephone Encounter (Signed)
Dr. Neva Seat,  Is it ok to refer to Cards?  Anyone in particular?  Also, are you ok with rx'ing Percocet for patient?

## 2012-08-11 NOTE — Telephone Encounter (Signed)
Pt reports that Dr Shelle Iron had Rxd the Percocet for foot pain after she had a very bad break and she will call him back for RF. Pt stated that she can see any cardiologist, but she can not get off work at the end of any month or very beginning. appt will need to be set in the middle two weeks. She will be OOT from 9/22 - 25 also.

## 2012-08-11 NOTE — Telephone Encounter (Signed)
Any cardiologist, for hx of palpitations and fh of CAD. 1st available.  We did not discuss her percocet at last office visit - why does she take this, and when/who last prescribed it - I don't see where I prescribed this medicine.

## 2012-08-23 ENCOUNTER — Ambulatory Visit: Payer: 59 | Admitting: Cardiovascular Disease

## 2012-09-01 ENCOUNTER — Ambulatory Visit (INDEPENDENT_AMBULATORY_CARE_PROVIDER_SITE_OTHER): Payer: 59 | Admitting: Cardiovascular Disease

## 2012-09-01 ENCOUNTER — Encounter: Payer: Self-pay | Admitting: Cardiovascular Disease

## 2012-09-01 VITALS — BP 119/78 | HR 77 | Ht 67.0 in | Wt 194.0 lb

## 2012-09-01 DIAGNOSIS — J810 Acute pulmonary edema: Secondary | ICD-10-CM | POA: Insufficient documentation

## 2012-09-01 DIAGNOSIS — I10 Essential (primary) hypertension: Secondary | ICD-10-CM

## 2012-09-01 DIAGNOSIS — R002 Palpitations: Secondary | ICD-10-CM | POA: Insufficient documentation

## 2012-09-01 NOTE — Assessment & Plan Note (Signed)
She wants to go back on triamterene/HCTZ  F/U with primary to discuss.  Low sodium diet

## 2012-09-01 NOTE — Assessment & Plan Note (Signed)
Well controlled.  Continue current medications and low sodium Dash type diet.    

## 2012-09-01 NOTE — Patient Instructions (Signed)
Your physician recommends that you schedule a follow-up appointment in: AS NEEDED Your physician has requested that you have a stress echocardiogram. For further information please visit [email or web address] Please follow instruction sheet as given. DX  785.1  Your physician recommends that you continue on your current medications as directed. Please refer to the Current Medication list given to you today.

## 2012-09-01 NOTE — Progress Notes (Signed)
Patient ID: Taylor Gay, female   DOB: March 31, 1946, 66 y.o.   MRN: [id number] 66 yo referred by primary for palpitaitons.  Also had edema Had broken ankle with surgery about 8 months ago and had dependant edema left greater than right.  Family history of CAD.  She gets occasional palpitations.  Once/month for minutes.  Not bad enough to require monitoring.  Mild exertional dsypnea .  No chest pain.  No previous cardiac disease.  Reviewed lab work from 6/4  TSH 2.4 TC 191 LDL 119 Other labs normal  ROS: Denies fever, malais, weight loss, blurry vision, decreased visual acuity, cough, sputum, SOB, hemoptysis, pleuritic pain, palpitaitons, heartburn, abdominal pain, melena, lower extremity edema, claudication, or rash.  All other systems reviewed and negative   General: Affect appropriate Healthy:  appears stated age HEENT: normal Neck supple with no adenopathy JVP normal no bruits no thyromegaly Lungs clear with no wheezing and good diaphragmatic motion Heart:  S1/S2 no murmur,rub, gallop or click PMI normal Abdomen: benighn, BS positve, no tenderness, no AAA no bruit.  No HSM or HJR Distal pulses intact with no bruits Trace bilateral edema Neuro non-focal Skin warm and dry No muscular weakness  Medications Current Outpatient Prescriptions  Medication Sig Dispense Refill  . estrogens, conjugated, (PREMARIN) 0.3 MG tablet Take 1 tablet (0.3 mg total) by mouth daily.  30 tablet  2  . famotidine (PEPCID) 20 MG tablet Take 20 mg by mouth 2 (two) times daily.      Marland Kitchen loratadine (CLARITIN) 10 MG tablet Take 10 mg by mouth as needed.       Marland Kitchen losartan-hydrochlorothiazide (HYZAAR) 100-25 MG per tablet Take 1 tablet by mouth daily.  90 tablet  1  . METRONIDAZOLE, TOPICAL, 0.75 % LOTN Apply topically to face each day as needed  59 mL  2  . Naproxen Sodium (ALEVE) 220 MG CAPS Take by mouth as needed.      Marland Kitchen Neomy-Bacit-Polymyx-Pramoxine (NEOSPORIN + PAIN RELIEF MAX ST) 1 % OINT Apply topically as  needed.      Marland Kitchen oxyCODONE-acetaminophen (PERCOCET) 10-325 MG per tablet Take 1 tablet by mouth every 4 (four) hours as needed.        Allergies Amitriptyline hcl; Flexeril; Morphine; Neomycin-bacitracin zn-polymyx; Omeprazole; Pantoprazole sodium; and Rabeprazole sodium  Family History: Family History  Problem Relation Age of Onset  . Breast cancer Mother   . Colon cancer Mother   . Diabetes Mother   . Breast cancer Maternal Grandmother   . Ovarian cancer Maternal Grandmother 43  . Heart disease Maternal Grandmother   . Diabetes Brother   . Heart disease Paternal Grandfather     Social History: History   Social History  . Marital Status: Divorced    Spouse Name: N/A    Number of Children: 2  . 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 Smoking status: Never Smoker   . Smokeless tobacco: Not on file  . Alcohol Use: No  . Drug Use: No  . Sexually Active: Not on file   Other Topics Concern  . 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    Electrocardiogram:   05/30/12  SR rate 60  Normal   Assessment and Plan

## 2012-09-01 NOTE — Assessment & Plan Note (Signed)
Benign sounding with family history will order stress echo

## 2012-09-19 ENCOUNTER — Encounter (HOSPITAL_COMMUNITY): Payer: Self-pay

## 2012-09-23 ENCOUNTER — Encounter: Payer: Self-pay | Admitting: Cardiovascular Disease

## 2012-09-23 ENCOUNTER — Other Ambulatory Visit (HOSPITAL_COMMUNITY): Payer: Self-pay | Admitting: Radiology

## 2012-09-23 ENCOUNTER — Ambulatory Visit (HOSPITAL_COMMUNITY): Payer: 59 | Attending: Internal Medicine

## 2012-09-23 ENCOUNTER — Ambulatory Visit (HOSPITAL_COMMUNITY): Payer: 59 | Attending: Internal Medicine | Admitting: Radiology

## 2012-09-23 DIAGNOSIS — R0602 Shortness of breath: Secondary | ICD-10-CM

## 2012-09-23 DIAGNOSIS — R0609 Other forms of dyspnea: Secondary | ICD-10-CM | POA: Insufficient documentation

## 2012-09-23 DIAGNOSIS — R079 Chest pain, unspecified: Secondary | ICD-10-CM

## 2012-09-23 DIAGNOSIS — R0989 Other specified symptoms and signs involving the circulatory and respiratory systems: Secondary | ICD-10-CM | POA: Insufficient documentation

## 2012-09-23 DIAGNOSIS — R002 Palpitations: Secondary | ICD-10-CM

## 2012-09-23 DIAGNOSIS — Z8249 Family history of ischemic heart disease and other diseases of the circulatory system: Secondary | ICD-10-CM | POA: Insufficient documentation

## 2012-09-23 DIAGNOSIS — I10 Essential (primary) hypertension: Secondary | ICD-10-CM | POA: Insufficient documentation

## 2012-09-23 MED ORDER — PERFLUTREN LIPID MICROSPHERE
6.0000 mL | INTRAVENOUS | Status: AC | PRN
  Administered 2012-09-23: 6 mL via INTRAVENOUS

## 2012-09-23 NOTE — Progress Notes (Signed)
Stress Echocardiogram performed.  

## 2012-09-26 ENCOUNTER — Encounter (HOSPITAL_COMMUNITY): Payer: Self-pay | Admitting: Cardiovascular Disease

## 2012-09-26 ENCOUNTER — Ambulatory Visit: Payer: 59 | Admitting: Family Medicine

## 2012-09-30 ENCOUNTER — Ambulatory Visit: Payer: 59 | Admitting: Family Medicine

## 2012-10-06 ENCOUNTER — Ambulatory Visit (INDEPENDENT_AMBULATORY_CARE_PROVIDER_SITE_OTHER): Payer: 59 | Admitting: Radiology

## 2012-10-06 ENCOUNTER — Telehealth: Payer: Self-pay

## 2012-10-06 DIAGNOSIS — Z23 Encounter for immunization: Secondary | ICD-10-CM

## 2012-10-06 NOTE — Telephone Encounter (Signed)
Can we send in Rx? Please advise

## 2012-10-06 NOTE — Telephone Encounter (Signed)
Patient would like an rx for shingles shot sent to [facility].  Best [phone]

## 2012-10-07 NOTE — Telephone Encounter (Signed)
Patient notified RX written and we will call in to [facility]. Called pharmacy left RX on voice mail.

## 2012-10-07 NOTE — Telephone Encounter (Signed)
Rx written.  Unfortunately, this prescription is no one we can send electronically.  We can mail or fax it, or she can pick it up.

## 2012-10-26 ENCOUNTER — Encounter: Payer: Self-pay | Admitting: Family Medicine

## 2012-10-26 ENCOUNTER — Ambulatory Visit (INDEPENDENT_AMBULATORY_CARE_PROVIDER_SITE_OTHER): Payer: 59 | Admitting: Family Medicine

## 2012-10-26 VITALS — BP 132/88 | Temp 98.0°F | Wt 198.0 lb

## 2012-10-26 DIAGNOSIS — M549 Dorsalgia, unspecified: Secondary | ICD-10-CM

## 2012-10-26 DIAGNOSIS — M546 Pain in thoracic spine: Secondary | ICD-10-CM

## 2012-10-26 MED ORDER — TRAMADOL HCL 50 MG PO TABS: ORAL_TABLET | ORAL | Status: DC

## 2012-10-26 MED ORDER — TRIAMTERENE-HCTZ 75-50 MG PO TABS: 1.0000 | ORAL_TABLET | Freq: Every day | ORAL | Status: DC

## 2012-10-26 NOTE — Patient Instructions (Signed)
1. Follow-up with Dr. [name] if you experience any blistering rash at or near the site of your pain or anywhere else on your body, as this could be shingles. 2. Also call if you begin to experience any shortness of breath, cough, fever, chest pain or tightness.

## 2012-10-26 NOTE — Progress Notes (Signed)
Subjective:     Patient ID: Taylor Gay, female   DOB: 25-Jul-1946, 66 y.o.   MRN: [id number]  HPI  66 year old female with history of HTN and PUD presents with 3-day history of L upper back pain.  She rates the pain as 10/10 in severity.  Pt notes that 6-7 years ago she was dx'd with pneumonia twice several months apart following similar presentations.  She denies CP, SOB, cough, hemoptysis, fever, chills or pleuritic CP.  She denies having done any heavy lifting, suddenly turning her neck, or other activities that might cause MSK pain.  Pain is intense enough to prevent pt from sleeping, and has cause her decreased appetite.  Aleve has been ineffective in controlling pain.  Review of Systems  Constitutional: Positive for appetite change.  Respiratory: Negative for cough, chest tightness and shortness of breath.   Cardiovascular: Negative for chest pain.  Musculoskeletal: Positive for back pain.       Objective:   Physical Exam  Constitutional: She is oriented to person, place, and time. She appears well-developed and well-nourished.  Cardiovascular: Normal rate, regular rhythm and normal heart sounds.   Pulmonary/Chest: Effort normal and breath sounds normal. No respiratory distress. She has no wheezes. She has no rales.  Musculoskeletal:       L upper back pain reproducible with flexion and extension of neck, as well as tilting head to left.  Pain localized about halfway between L scapula and spinous processes.  Arm movements do not elicit pain.  Neurological: She is alert and oriented to person, place, and time.  Skin: Skin is warm and dry.       Assessment:     66 year old with 3-day history of L upper back pain.    Plan:     1. L upper back pain: likely musculoskeletal.  Pulm and CV exams both benign, and pt has no hx of fever, cough, SOB or other s/sx that would suggest pneumonia.  Also possible that it is prodromal pain related to shingles, as pt has not had Zostavax.  Start  Tramadol 50mg  q6h as needed for pain.  Continue to monitor pain site for any blistering rash suggestive of shingles eruption, and follow-up if this occurs.  Also follow-up if any development of CP, SOB, fever, or chills.     Agree with assessment and plan as per Marthann Schiller, MS 3  No evidence for pneumonia, which was patient's initial concern.  Should has some thoracic muscle tenderness and suspect musculoskeletal etiology.  Evelena Peat MD

## 2012-11-04 ENCOUNTER — Other Ambulatory Visit (INDEPENDENT_AMBULATORY_CARE_PROVIDER_SITE_OTHER): Payer: 59

## 2012-11-04 DIAGNOSIS — Z0000 Encounter for general adult medical examination without abnormal findings: Secondary | ICD-10-CM

## 2012-11-04 LAB — HEPATIC FUNCTION PANEL
ALT: 20 U/L (ref 0–35)
AST: 22 U/L (ref 0–37)
Alkaline Phosphatase: 88 U/L (ref 39–117)
Bilirubin, Direct: 0 mg/dL (ref 0.0–0.3)
Total Bilirubin: 0.6 mg/dL (ref 0.3–1.2)

## 2012-11-04 LAB — CBC WITH DIFFERENTIAL/PLATELET
Basophils Absolute: 0 10*3/uL (ref 0.0–0.1)
Basophils Relative: 0.5 % (ref 0.0–3.0)
Eosinophils Absolute: 0.1 10*3/uL (ref 0.0–0.7)
Hemoglobin: 13.7 g/dL (ref 12.0–15.0)
MCHC: 33.2 g/dL (ref 30.0–36.0)
MCV: 86.9 fl (ref 78.0–100.0)
Monocytes Absolute: 0.4 10*3/uL (ref 0.1–1.0)
Neutro Abs: 3.8 10*3/uL (ref 1.4–7.7)
Neutrophils Relative %: 65.1 % (ref 43.0–77.0)
RBC: 4.74 Mil/uL (ref 3.87–5.11)
RDW: 13.8 % (ref 11.5–14.6)

## 2012-11-04 LAB — BASIC METABOLIC PANEL
BUN: 24 mg/dL — ABNORMAL HIGH (ref 6–23)
Creatinine, Ser: 1 mg/dL (ref 0.4–1.2)
GFR: 58.52 mL/min — ABNORMAL LOW (ref >60.00)

## 2012-11-04 LAB — POCT URINALYSIS DIPSTICK
Bilirubin, UA: NEGATIVE
Blood, UA: NEGATIVE
Glucose, UA: NEGATIVE
Ketones, UA: NEGATIVE
Spec Grav, UA: 1.01

## 2012-11-04 LAB — LIPID PANEL: Cholesterol: 203 mg/dL — ABNORMAL HIGH (ref 0–200)

## 2012-11-11 ENCOUNTER — Encounter: Payer: Self-pay | Admitting: Family Medicine

## 2012-11-11 ENCOUNTER — Ambulatory Visit (INDEPENDENT_AMBULATORY_CARE_PROVIDER_SITE_OTHER): Payer: 59 | Admitting: Family Medicine

## 2012-11-11 VITALS — BP 142/82 | HR 72 | Temp 97.8°F | Resp 12 | Ht 67.25 in | Wt 201.0 lb

## 2012-11-11 DIAGNOSIS — L918 Other hypertrophic disorders of the skin: Secondary | ICD-10-CM

## 2012-11-11 DIAGNOSIS — L988 Other specified disorders of the skin and subcutaneous tissue: Secondary | ICD-10-CM

## 2012-11-11 DIAGNOSIS — Z0000 Encounter for general adult medical examination without abnormal findings: Secondary | ICD-10-CM

## 2012-11-11 DIAGNOSIS — L909 Atrophic disorder of skin, unspecified: Secondary | ICD-10-CM

## 2012-11-11 DIAGNOSIS — L821 Other seborrheic keratosis: Secondary | ICD-10-CM

## 2012-11-11 MED ORDER — CELECOXIB 200 MG PO CAPS: 200.0000 mg | ORAL_CAPSULE | Freq: Two times a day (BID) | ORAL | Status: DC
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
## 2013-01-08 ENCOUNTER — Ambulatory Visit (INDEPENDENT_AMBULATORY_CARE_PROVIDER_SITE_OTHER): Payer: 59 | Admitting: Emergency Medicine

## 2013-01-08 ENCOUNTER — Ambulatory Visit: Payer: 59

## 2013-01-08 VITALS — BP 105/79 | HR 79 | Temp 98.6°F | Resp 20 | Ht 67.5 in | Wt 197.0 lb

## 2013-01-08 DIAGNOSIS — R067 Sneezing: Secondary | ICD-10-CM

## 2013-01-08 DIAGNOSIS — R059 Cough, unspecified: Secondary | ICD-10-CM

## 2013-01-08 DIAGNOSIS — R05 Cough: Secondary | ICD-10-CM

## 2013-01-08 DIAGNOSIS — R6889 Other general symptoms and signs: Secondary | ICD-10-CM

## 2013-01-08 DIAGNOSIS — J329 Chronic sinusitis, unspecified: Secondary | ICD-10-CM

## 2013-01-08 LAB — POCT INFLUENZA A/B: Influenza A, POC: NEGATIVE

## 2013-01-08 MED ORDER — HYDROCODONE-HOMATROPINE 5-1.5 MG/5ML PO SYRP: 5.0000 mL | ORAL_SOLUTION | Freq: Three times a day (TID) | ORAL | Status: DC | PRN

## 2013-01-08 MED ORDER — AMOXICILLIN 875 MG PO TABS: 875.0000 mg | ORAL_TABLET | Freq: Two times a day (BID) | ORAL | Status: DC

## 2013-01-08 NOTE — Progress Notes (Signed)
  Subjective:    Patient ID: Taylor Gay, female    DOB: 22-Sep-1946, 67 y.o.   MRN: [id number]  HPI patient states that 10 days ago she developed a sore throat. Associated with this she developed a dry cough. She was subsequently developed facial pain associated with an unrelenting cough she is unable to get relief from. She took Mucinex DM without any relief. She feels a great deal of sinus pressure with some sinus drainage of bloody mucus. She is tender in her face but not in her teeth. She has coughed up a minimal amount of phlegm.    Review of Systems a long history of migraines that have been hard to treat     Objective:   Physical Exam HEENT exam the TMs are clear to the turbinates are swollen patient has a husky voice. The posterior pharynx is clear to the chest is clear to auscultation and percussion Results for orders placed in visit on 01/08/13  POCT INFLUENZA A/B      Component Value Range   Influenza A, POC Negative     Influenza B, POC Negative    [facility] reading (PRIMARY) by  Dr.Joclyn Alsobrook no pneumonia seen         Assessment & Plan:  We'll check a flu swab to see if this was the initiating illness as well as chest x-ray in that she has a long history of secondhand smoke exposure and her mother died of COPD we'll treat with Hycodan and cover her sinuses with amoxicillin twice a day.

## 2013-01-08 NOTE — Patient Instructions (Addendum)

## 2013-01-25 ENCOUNTER — Ambulatory Visit (INDEPENDENT_AMBULATORY_CARE_PROVIDER_SITE_OTHER): Payer: 59 | Admitting: Family Medicine

## 2013-01-25 ENCOUNTER — Telehealth: Payer: Self-pay

## 2013-01-25 VITALS — BP 124/80 | HR 86 | Temp 97.6°F | Resp 16 | Ht 67.0 in | Wt 200.0 lb

## 2013-01-25 DIAGNOSIS — R05 Cough: Secondary | ICD-10-CM

## 2013-01-25 DIAGNOSIS — R059 Cough, unspecified: Secondary | ICD-10-CM

## 2013-01-25 MED ORDER — PSEUDOEPH-BROMPHEN-DM 30-2-10 MG/5ML PO SYRP: 5.0000 mL | ORAL_SOLUTION | Freq: Three times a day (TID) | ORAL | Status: DC | PRN
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
## 2013-01-25 NOTE — Telephone Encounter (Signed)
Hycodan not helpful, was here on 01/08/13 if she is still coughing I feel like she should return to clinic, she agrees she will come in later today.

## 2013-01-25 NOTE — Telephone Encounter (Signed)
PT STATES THE COUGH MEDICINE SHE WAS GIVEN ISN'T HELPING. STATES SOMEONE HAD TOLD HER ABOUT BROMFED-DM OR BROMSHELL, SHE ISN'T SURE HOW TO SPELL IT BUT HEARD IT WAS REALLY GOOD. WOULD LIKE TO TRY IT. PLEASE CALL [phone]    [place]

## 2013-02-27 ENCOUNTER — Encounter: Payer: Self-pay | Admitting: Family Medicine

## 2013-02-27 ENCOUNTER — Ambulatory Visit (INDEPENDENT_AMBULATORY_CARE_PROVIDER_SITE_OTHER): Payer: 59 | Admitting: Family Medicine

## 2013-02-27 VITALS — BP 120/80 | Temp 98.0°F | Wt 204.0 lb

## 2013-02-27 DIAGNOSIS — R05 Cough: Secondary | ICD-10-CM

## 2013-02-27 DIAGNOSIS — L909 Atrophic disorder of skin, unspecified: Secondary | ICD-10-CM

## 2013-02-27 DIAGNOSIS — R296 Repeated falls: Secondary | ICD-10-CM

## 2013-02-27 DIAGNOSIS — Z9181 History of falling: Secondary | ICD-10-CM

## 2013-02-27 DIAGNOSIS — R059 Cough, unspecified: Secondary | ICD-10-CM

## 2013-02-27 DIAGNOSIS — L820 Inflamed seborrheic keratosis: Secondary | ICD-10-CM

## 2013-02-27 DIAGNOSIS — L918 Other hypertrophic disorders of the skin: Secondary | ICD-10-CM

## 2013-02-27 MED ORDER — ALBUTEROL SULFATE HFA 108 (90 BASE) MCG/ACT IN AERS: 2.0000 | INHALATION_SPRAY | Freq: Four times a day (QID) | RESPIRATORY_TRACT | Status: DC | PRN
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
## 2013-02-27 NOTE — Patient Instructions (Addendum)
Seborrheic Keratosis  Seborrheic keratosis is a common, noncancerous (benign) skin growth that can occur anywhere on the skin. It looks like "stuck-on," waxy, rough, tan, brown, or black spots on the skin. These skin growths can be flat or raised. They are often called "barnacles" because of their pasted-on appearance. Usually, these skin growths appear in adulthood, around age 65, and increase in number as you age. They may also develop during pregnancy or following estrogen therapy. Many people may only have one growth appear in their lifetime, while some people may develop many growths.  CAUSES  It is unknown what causes these skin growths, but they appear to run in families.  SYMPTOMS  Seborrheic keratosis is often located on the face, chest, shoulders, back, or other areas. These growths are:  · Usually painless, but may become irritated and itchy.  · Yellow, brown, black, or other colors.  · Slightly raised or have a flat surface.  · Sometimes rough or wart-like in texture.  · Often waxy on the surface.  · Round or oval-shaped.  · Sometimes "stuck-on" in appearance.  · Sometimes single, but there are usually many growths.  Any growth that bleeds, itches on a regular basis, becomes inflamed, or becomes irritated needs to be evaluated by a skin specialist (dermatologist).  DIAGNOSIS  Diagnosis is mainly based on the way the growths appear. In some cases, it can be difficult to tell this type of skin growth from skin cancer. A skin growth tissue sample (biopsy)  may be used to confirm the diagnosis.  TREATMENT  Most often, treatment is not needed because the skin growths are benign. If the skin growth is irritated easily by clothing or jewelry, causing it to scab or bleed, treatment may be recommended. Patients may also choose to have the growths removed because they do not like their appearance. Most commonly, these growths are treated with cryosurgery.  In cryosurgery, liquid nitrogen is applied to "freeze" the  growth. The growth usually falls off within a matter of days. A blister may form and dry into a scab that will also fall off. After the growth or scab falls off, it may leave a dark or light spot on the skin. This color may fade over time, or it may remain permanent on the skin.  HOME CARE INSTRUCTIONS  If the skin growths are treated with cryosurgery, the treated area needs to be kept clean with water and soap.  SEEK MEDICAL CARE IF:  · You have questions about these growths or other skin problems.  · You develop new symptoms, including:  · A change in the appearance of the skin growth.  · New growths.  · Any bleeding, itching, or pain in the growths.  · A skin growth that looks similar to seborrheic keratosis.  Document Released: 01/09/2011 Document Revised: 02/29/2012 Document Reviewed: 01/09/2011  [employer]® Patient Information ©2013 [employer].

## 2013-03-06 ENCOUNTER — Encounter: Payer: Self-pay | Admitting: Family Medicine

## 2013-03-06 ENCOUNTER — Ambulatory Visit (INDEPENDENT_AMBULATORY_CARE_PROVIDER_SITE_OTHER): Payer: 59 | Admitting: Family Medicine

## 2013-03-06 VITALS — BP 132/72 | HR 76 | Temp 97.7°F | Resp 12 | Wt 207.0 lb

## 2013-03-06 DIAGNOSIS — J988 Other specified respiratory disorders: Secondary | ICD-10-CM

## 2013-03-06 MED ORDER — AZITHROMYCIN 250 MG PO TABS: ORAL_TABLET | ORAL | Status: AC

## 2013-03-06 MED ORDER — METHYLPREDNISOLONE ACETATE 80 MG/ML IJ SUSP
80.0000 mg | Freq: Once | INTRAMUSCULAR | Status: AC
  Administered 2013-03-06: 80 mg via INTRAMUSCULAR

## 2013-03-06 NOTE — Progress Notes (Signed)
  Subjective:    Patient ID: Taylor Gay, female    DOB: 24-May-1946, 67 y.o.   MRN: [id number]  HPI Acute visit. Recently seen with respiratory illness. She's had persistent cough past couple weeks. Initially thought allergy trigger Nonsmoker. She is wheezing. Cough productive of green sputum. Denies any fever. Still has some mild nasal congestion. Taking antihistamine since last week. Using albuterol as needed Intermittent laryngitis symptoms.  Previous intolerance to oral steroids but has tolerated the injection without difficulty.   Review of Systems  Constitutional: Positive for fatigue. Negative for fever and chills.  HENT: Positive for congestion.   Respiratory: Positive for cough and wheezing. Negative for shortness of breath.   Cardiovascular: Negative for chest pain.  Neurological: Negative for dizziness, syncope and headaches.       Objective:   Physical Exam  Constitutional: She appears well-developed and well-nourished. No distress.  HENT:  Right Ear: External ear normal.  Left Ear: External ear normal.  Mouth/Throat: Oropharynx is clear and moist.  Neck: Neck supple.  Cardiovascular: Normal rate and regular rhythm.   Pulmonary/Chest: Effort normal.  Patient has some mild diffuse expiratory wheezes. No rales. No retractions  Lymphadenopathy:    She has no cervical adenopathy.          Assessment & Plan:  Cough with mild reactive airway component. Question viral versus allergic trigger. Depo-Medrol 80 mg IM. Continue albuterol as needed. Start Zithromax with worsening productive cough

## 2013-03-06 NOTE — Patient Instructions (Addendum)
Touch base if you if you have any fever or increased shortness of breath.

## 2013-03-06 NOTE — Addendum Note (Signed)
Addended by: Melchor Amour on: 03/06/2013 10:53 AM   Modules accepted: Orders

## 2013-06-30 ENCOUNTER — Ambulatory Visit (INDEPENDENT_AMBULATORY_CARE_PROVIDER_SITE_OTHER): Payer: 59 | Admitting: Family Medicine

## 2013-06-30 ENCOUNTER — Encounter: Payer: Self-pay | Admitting: Family Medicine

## 2013-06-30 VITALS — BP 122/74 | HR 102 | Temp 98.3°F | Wt 207.0 lb

## 2013-06-30 DIAGNOSIS — M7918 Myalgia, other site: Secondary | ICD-10-CM

## 2013-06-30 DIAGNOSIS — IMO0001 Reserved for inherently not codable concepts without codable children: Secondary | ICD-10-CM

## 2013-06-30 DIAGNOSIS — M545 Low back pain, unspecified: Secondary | ICD-10-CM

## 2013-06-30 MED ORDER — HYDROCODONE-ACETAMINOPHEN 5-325 MG PO TABS: ORAL_TABLET | ORAL | Status: DC

## 2013-06-30 NOTE — Patient Instructions (Addendum)
Follow up promptly for any weakness, lower extremity numbness, or progressive pain
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
## 2013-07-03 ENCOUNTER — Ambulatory Visit (INDEPENDENT_AMBULATORY_CARE_PROVIDER_SITE_OTHER)
Admission: RE | Admit: 2013-07-03 | Discharge: 2013-07-03 | Disposition: A | Discharge status: Home / Self Care | Payer: 59 | Source: Ambulatory Visit | Attending: Family Medicine | Admitting: Family Medicine

## 2013-07-03 DIAGNOSIS — M545 Low back pain, unspecified: Secondary | ICD-10-CM

## 2013-07-04 ENCOUNTER — Encounter: Payer: Self-pay | Admitting: Family Medicine

## 2013-07-05 ENCOUNTER — Encounter: Payer: Self-pay | Admitting: Family Medicine

## 2013-07-06 NOTE — Addendum Note (Signed)
Addended by: Kristian Covey on: 07/06/2013 08:27 AM   Modules accepted: Orders

## 2013-07-10 MED ORDER — PREDNISONE 10 MG PO TABS: ORAL_TABLET | ORAL | Status: DC

## 2013-07-10 NOTE — Telephone Encounter (Signed)
Pt requesting trial of medrol dose pack for back pain.  She states she cannot afford ortho follow up at this time.  Will send in but she will need follow up if not better in 2 weeks. Get temporary handicap form to sign.

## 2013-07-13 ENCOUNTER — Ambulatory Visit: Payer: 59 | Admitting: Family Medicine

## 2013-07-17 ENCOUNTER — Encounter: Payer: Self-pay | Admitting: Family Medicine

## 2013-07-26 ENCOUNTER — Other Ambulatory Visit: Payer: Self-pay

## 2013-07-28 ENCOUNTER — Ambulatory Visit: Payer: 59 | Admitting: Family Medicine

## 2013-08-04 ENCOUNTER — Encounter: Payer: Self-pay | Admitting: Family Medicine

## 2013-08-04 ENCOUNTER — Ambulatory Visit (INDEPENDENT_AMBULATORY_CARE_PROVIDER_SITE_OTHER): Payer: 59 | Admitting: Family Medicine

## 2013-08-04 VITALS — BP 130/80 | HR 76 | Temp 98.2°F | Wt 207.0 lb

## 2013-08-04 DIAGNOSIS — M545 Low back pain, unspecified: Secondary | ICD-10-CM

## 2013-08-04 DIAGNOSIS — M79604 Pain in right leg: Secondary | ICD-10-CM

## 2013-08-04 DIAGNOSIS — Z23 Encounter for immunization: Secondary | ICD-10-CM

## 2013-08-04 MED ORDER — HYDROCODONE-ACETAMINOPHEN 10-325 MG PO TABS: 1.0000 | ORAL_TABLET | Freq: Four times a day (QID) | ORAL | Status: DC | PRN
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
## 2013-08-04 NOTE — Patient Instructions (Addendum)
We will call you regarding MRI scan. 

## 2013-08-07 ENCOUNTER — Encounter: Payer: Self-pay | Admitting: Family Medicine

## 2013-08-07 ENCOUNTER — Telehealth: Payer: Self-pay

## 2013-08-07 NOTE — Telephone Encounter (Signed)
Refill OK

## 2013-08-07 NOTE — Telephone Encounter (Signed)
Mupirocin 2% ointment 22gm Apply as directed  08/04/13 last visit  Ointment is not currently on pt medication list  [facility]

## 2013-08-08 MED ORDER — MUPIROCIN 2 % EX OINT: TOPICAL_OINTMENT | CUTANEOUS | Status: DC

## 2013-08-08 NOTE — Telephone Encounter (Signed)
Sent rx to pharmacy

## 2013-08-11 ENCOUNTER — Ambulatory Visit
Admission: RE | Admit: 2013-08-11 | Discharge: 2013-08-11 | Disposition: A | Discharge status: Home / Self Care | Payer: 59 | Source: Ambulatory Visit | Attending: Family Medicine | Admitting: Family Medicine

## 2013-08-11 DIAGNOSIS — M79604 Pain in right leg: Secondary | ICD-10-CM

## 2013-08-11 DIAGNOSIS — M545 Low back pain: Secondary | ICD-10-CM

## 2013-08-14 ENCOUNTER — Encounter: Payer: Self-pay | Admitting: Family Medicine

## 2013-08-14 DIAGNOSIS — M545 Low back pain, unspecified: Secondary | ICD-10-CM

## 2013-08-15 ENCOUNTER — Telehealth: Payer: Self-pay | Admitting: Family Medicine

## 2013-08-15 NOTE — Telephone Encounter (Signed)
Pt states that she is returning your call, in regards to her MRI results. Please assist.

## 2013-08-16 NOTE — Telephone Encounter (Signed)
Lets try referral to [facility] for her back pain.  Let her know I will make referral.

## 2013-08-16 NOTE — Telephone Encounter (Signed)
Pt has been notified.  Referral back to orthopedist.  MRI did not show any obvious surgical problem.

## 2013-08-25 ENCOUNTER — Encounter: Payer: Self-pay | Admitting: Family Medicine

## 2013-09-03 ENCOUNTER — Encounter: Payer: Self-pay | Admitting: Family Medicine

## 2013-10-26 ENCOUNTER — Other Ambulatory Visit: Payer: Self-pay

## 2013-11-01 ENCOUNTER — Other Ambulatory Visit: Payer: 59

## 2013-11-02 ENCOUNTER — Other Ambulatory Visit: Payer: Self-pay

## 2013-11-02 ENCOUNTER — Ambulatory Visit (INDEPENDENT_AMBULATORY_CARE_PROVIDER_SITE_OTHER): Payer: 59

## 2013-11-02 DIAGNOSIS — Z23 Encounter for immunization: Secondary | ICD-10-CM

## 2013-11-02 MED ORDER — TRIAMTERENE-HCTZ 75-50 MG PO TABS: 1.0000 | ORAL_TABLET | Freq: Every day | ORAL | Status: DC

## 2013-11-20 ENCOUNTER — Encounter: Payer: 59 | Admitting: Family Medicine

## 2013-11-27 ENCOUNTER — Encounter: Payer: Self-pay | Admitting: Family Medicine

## 2013-11-28 ENCOUNTER — Encounter: Payer: Self-pay | Admitting: *Deleted

## 2013-11-29 ENCOUNTER — Other Ambulatory Visit (INDEPENDENT_AMBULATORY_CARE_PROVIDER_SITE_OTHER): Payer: 59

## 2013-11-29 DIAGNOSIS — Z0000 Encounter for general adult medical examination without abnormal findings: Secondary | ICD-10-CM

## 2013-11-29 LAB — POCT URINALYSIS DIPSTICK
Bilirubin, UA: NEGATIVE
Leukocytes, UA: NEGATIVE
Nitrite, UA: NEGATIVE
Protein, UA: NEGATIVE
Urobilinogen, UA: 0.2
pH, UA: 6

## 2013-11-29 LAB — BASIC METABOLIC PANEL
CO2: 22 mEq/L (ref 19–32)
Calcium: 9.2 mg/dL (ref 8.4–10.5)
Chloride: 105 mEq/L (ref 96–112)
Creatinine, Ser: 1 mg/dL (ref 0.4–1.2)
Sodium: 141 mEq/L (ref 135–145)

## 2013-11-29 LAB — CBC WITH DIFFERENTIAL/PLATELET
Basophils Relative: 0.4 % (ref 0.0–3.0)
Eosinophils Absolute: 0.1 10*3/uL (ref 0.0–0.7)
Eosinophils Relative: 1.9 % (ref 0.0–5.0)
Hemoglobin: 14.4 g/dL (ref 12.0–15.0)
Lymphocytes Relative: 24.3 % (ref 12.0–46.0)
MCHC: 34.1 g/dL (ref 30.0–36.0)
Neutro Abs: 5.2 10*3/uL (ref 1.4–7.7)
Neutrophils Relative %: 67.6 % (ref 43.0–77.0)
RBC: 4.97 Mil/uL (ref 3.87–5.11)
WBC: 7.6 10*3/uL (ref 4.5–10.5)

## 2013-11-29 LAB — LIPID PANEL
HDL: 47.7 mg/dL (ref >39.00)
LDL Cholesterol: 121 mg/dL — ABNORMAL HIGH (ref 0–99)
Total CHOL/HDL Ratio: 4
Triglycerides: 104 mg/dL (ref 0.0–149.0)
VLDL: 20.8 mg/dL (ref 0.0–40.0)

## 2013-11-29 LAB — HEPATIC FUNCTION PANEL
ALT: 19 U/L (ref 0–35)
Albumin: 4.2 g/dL (ref 3.5–5.2)
Bilirubin, Direct: 0 mg/dL (ref 0.0–0.3)
Total Protein: 8 g/dL (ref 6.0–8.3)

## 2013-12-06 ENCOUNTER — Encounter: Payer: 59 | Admitting: Family Medicine

## 2013-12-15 ENCOUNTER — Ambulatory Visit (INDEPENDENT_AMBULATORY_CARE_PROVIDER_SITE_OTHER): Payer: 59 | Admitting: Family Medicine

## 2013-12-15 ENCOUNTER — Encounter: Payer: Self-pay | Admitting: Family Medicine

## 2013-12-15 VITALS — BP 130/80 | HR 77 | Temp 97.7°F | Ht 67.0 in | Wt 216.0 lb

## 2013-12-15 DIAGNOSIS — E669 Obesity, unspecified: Secondary | ICD-10-CM | POA: Insufficient documentation

## 2013-12-15 DIAGNOSIS — R319 Hematuria, unspecified: Secondary | ICD-10-CM

## 2013-12-15 DIAGNOSIS — M48061 Spinal stenosis, lumbar region without neurogenic claudication: Secondary | ICD-10-CM | POA: Insufficient documentation

## 2013-12-15 DIAGNOSIS — Z0000 Encounter for general adult medical examination without abnormal findings: Secondary | ICD-10-CM

## 2013-12-15 DIAGNOSIS — E876 Hypokalemia: Secondary | ICD-10-CM

## 2013-12-15 LAB — POCT URINALYSIS DIPSTICK
Glucose, UA: NEGATIVE
Nitrite, UA: NEGATIVE
Protein, UA: NEGATIVE
Spec Grav, UA: 1.02
Urobilinogen, UA: 0.2

## 2013-12-15 LAB — URINALYSIS, MICROSCOPIC ONLY

## 2013-12-15 MED ORDER — HYDROCODONE-ACETAMINOPHEN 10-325 MG PO TABS: 1.0000 | ORAL_TABLET | Freq: Four times a day (QID) | ORAL | Status: DC | PRN

## 2013-12-15 MED ORDER — ESCITALOPRAM OXALATE 10 MG PO TABS: 10.0000 mg | ORAL_TABLET | Freq: Every day | ORAL | Status: DC

## 2013-12-15 NOTE — Patient Instructions (Signed)
Potassium Content of Foods Potassium is a mineral found in many foods and drinks. It helps keep fluids and minerals balanced in your body and also affects how steadily your heart beats. The body needs potassium to control blood pressure and to keep the muscles and nervous system healthy. However, certain health conditions and medicine may require you to eat more or less potassium-rich foods and drinks. Your caregiver or dietitian will tell you how much potassium you should have each day. COMMON SERVING SIZES The list below tells you how big or small common portion sizes are:  1 oz.........4 stacked dice.  3 oz.........Deck of cards.  1 tsp........Tip of little finger.  1 tbsp......Thumb.  2 tbsp......Golf ball.   c...........Half of a fist.  1 c............A fist. FOODS AND DRINKS HIGH IN POTASSIUM More than 200 mg of potassium per serving. A serving size is  c (120 mL or noted gram weight) unless otherwise stated. While all the items on this list are high in potassium, some items are higher in potassium than others. Fruits  Apricots (sliced), 83 g.  Apricots (dried halves), 3 oz / 24 g.  Avocado (cubed),  c / 50 g.  Banana (sliced), 75 g.  Cantaloupe (cubed), 80 g.  Dates (pitted), 5 whole / 35 g.  Figs (dried), 4 whole / 32 g.  Guava, c / 55 g.  Honeydew, 1 wedge / 85 g.  Kiwi (sliced), 90 g.  Nectarine, 1 small / 129 g.  Orange, 1 medium / 131 g.  Orange juice.  Pomegranate seeds, 87 g.  Pomegranate juice.  Prunes (pitted), 3 whole / 30 g.  Prune juice, 3 oz / 90 mL.  Seedless raisins, 3 tbsp / 27 g. Vegetables  Artichoke,  of a medium / 64 g.  Asparagus (boiled), 90 g.  Baked beans,  c / 63 g.  Bamboo shoots,  c / 38 g.  Beets (cooked slices), 85 g.  Broccoli (boiled), 78 g.  Brussels sprout (boiled), 78 g.  Butternut squash (baked), 103 g.  Chickpea (cooked), 82 g.  Green peas (cooked), 80 g.  Hubbard squash (baked cubes),  c /  68 g.  Kidney beans (cooked), 5 tbsp / 55 g.  Lima beans (cooked),  c / 43 g.  Navy beans (cooked),  c / 61 g.  Potato (baked), 61 g.  Potato (boiled), 78 g.  Pumpkin (boiled), 123 g.  Refried beans,  c / 79 g.  Spinach (cooked),  c / 45 g.  Split peas (cooked),  c / 65 g.  Sun-dried tomatoes, 2 tbsp / 7 g.  Sweet potato (baked),  c / 50 g.  Tomato (chopped or sliced), 90 g.  Tomato juice.  Tomato paste, 4 tsp / 21 g.  Tomato sauce,  c / 61 g.  Vegetable juice.  White mushrooms (cooked), 78 g.  Yam (cooked or baked),  c / 34 g.  Zucchini squash (boiled), 90 g. Other Foods and Drinks  Almonds (whole),  c / 36 g.  Cashews (oil roasted),  c / 32 g.  Chocolate milk.  Chocolate pudding, 142 g.  Clams (steamed), 1.5 oz / 43 g.  Dark chocolate, 1.5 oz / 42 g.  Fish, 3 oz / 85 g.  King crab (steamed), 3 oz / 85 g.  Lobster (steamed), 4 oz / 113 g.  Milk (skim, 1%, 2%, whole), 1 c / 240 mL.  Milk chocolate, 2.3 oz / 66 g.  Milk shake.  Nonfat fruit   variety yogurt, 123 g.  Peanuts (oil roasted), 1 oz / 28 g.  Peanut butter, 2 tbsp / 32 g.  Pistachio nuts, 1 oz / 28 g.  Pumpkin seeds, 1 oz / 28 g.  Red meat (broiled, cooked, grilled), 3 oz / 85 g.  Scallops (steamed), 3 oz / 85 g.  Shredded wheat cereal (dry), 3 oblong biscuits / 75 g.  Spaghetti sauce,  c / 66 g.  Sunflower seeds (dry roasted), 1 oz / 28 g.  Veggie burger, 1 patty / 70 g. FOODS MODERATE IN POTASSIUM Between 150 mg and 200 mg per serving. A serving is  c (120 mL or noted gram weight) unless otherwise stated. Fruits  Grapefruit,  of the fruit / 123 g.  Grapefruit juice.  Pineapple juice.  Plums (sliced), 83 g.  Tangerine, 1 large / 120 g. Vegetables  Carrots (boiled), 78 g.  Carrots (sliced), 61 g.  Rhubarb (cooked with sugar), 120 g.  Rutabaga (cooked), 120 g.  Sweet corn (cooked), 75 g.  Yellow snap beans (cooked), 63 g. Other Foods and  Drinks   Bagel, 1 bagel / 98 g.  Chicken breast (roasted and chopped),  c / 70 g.  Chocolate ice cream / 66 g.  Pita bread, 1 large / 64 g.  Shrimp (steamed), 4 oz / 113 g.  Swiss cheese (diced), 70 g.  Vanilla ice cream, 66 g.  Vanilla pudding, 140 g. FOODS LOW IN POTASSIUM Less than 150 mg per serving. A serving size is  cup (120 mL or noted gram weight) unless otherwise stated. If you eat more than 1 serving of a food low in potassium, the food may be considered a food high in potassium. Fruits  Apple (slices), 55 g.  Apple juice.  Applesauce, 122 g.  Blackberries, 72 g.  Blueberries, 74 g.  Cranberries, 50 g.  Cranberry juice.  Fruit cocktail, 119 g.  Fruit punch.  Grapes, 46 g.  Grape juice.  Mandarin oranges (canned), 126 g.  Peach (slices), 77 g.  Pineapple (chunks), 83 g.  Raspberries, 62 g.  Red cherries (without pits), 78 g.  Strawberries (sliced), 83 g.  Watermelon (diced), 76 g. Vegetables  Alfalfa sprouts, 17 g.  Bell peppers (sliced), 46 g.  Cabbage (shredded), 35 g.  Cauliflower (boiled), 62 g.  Celery, 51 g.  Collard greens (boiled), 95 g.  Cucumber (sliced), 52 g.  Eggplant (cubed), 41 g.  Green beans (boiled), 63 g.  Lettuce (shredded), 1 c / 36 g.  Onions (sauteed), 44 g.  Radishes (sliced), 58 g.  Spaghetti squash, 51 g. Other Foods and Drinks  Angel food cake, 1 slice / 28 g.  Black tea.  Brown rice (cooked), 98 g.  Butter croissant, 1 medium / 57 g.  Carbonated soda.  Coffee.  Cheddar cheese (diced), 66 g.  Corn flake cereal (dry), 14 g.  Cottage cheese, 118 g.  Cream of rice cereal (cooked), 122 g.  Cream of wheat cereal (cooked), 126 g.  Crisped rice cereal (dry), 14 g.  Egg (boiled, fried, poached, omelet, scrambled), 1 large / 46 61 g.  English muffin, 1 muffin / 57 g.  Frozen ice pop, 1 pop / 55 g.  Graham cracker, 1 large rectangular cracker / 14 g.  Jelly beans, 112  g.  Non-dairy whipped topping.  Oatmeal, 88 g.  Orange sherbet, 74 g.  Puffed rice cereal (dry), 7 g.  Pasta (cooked), 70 g.  Rice cakes, 4 cakes / 36   g.  Sugared doughnut, 4 oz / 116 g.  White bread, 1 slice / 30 g.  White rice (cooked), 79 93 g.  Wild rice (cooked), 82 g.  Yellow cake, 1 slice / 68 g. Document Released: 07/21/2005 Document Revised: 11/23/2012 Document Reviewed: 04/22/2012 [facility] Patient Information 2014 [place], [place].  Set up repeat mammogram.
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
## 2013-12-15 NOTE — Progress Notes (Signed)
Pre visit review using our clinic review tool, if applicable. No additional management support is needed unless otherwise documented below in the visit note. 

## 2014-02-09 ENCOUNTER — Encounter: Payer: Self-pay | Admitting: Gastroenterology

## 2014-02-21 ENCOUNTER — Other Ambulatory Visit: Payer: Self-pay | Admitting: Family Medicine

## 2014-04-02 ENCOUNTER — Ambulatory Visit (INDEPENDENT_AMBULATORY_CARE_PROVIDER_SITE_OTHER): Payer: Medicare Other | Admitting: Family Medicine

## 2014-04-02 ENCOUNTER — Encounter: Payer: Self-pay | Admitting: Family Medicine

## 2014-04-02 VITALS — BP 130/78 | HR 78 | Temp 97.5°F | Wt 219.0 lb

## 2014-04-02 DIAGNOSIS — R059 Cough, unspecified: Secondary | ICD-10-CM

## 2014-04-02 DIAGNOSIS — J029 Acute pharyngitis, unspecified: Secondary | ICD-10-CM

## 2014-04-02 DIAGNOSIS — R05 Cough: Secondary | ICD-10-CM

## 2014-04-02 DIAGNOSIS — B372 Candidiasis of skin and nail: Secondary | ICD-10-CM

## 2014-04-02 MED ORDER — NYSTATIN 100000 UNIT/GM EX CREA: 1.0000 "application " | TOPICAL_CREAM | Freq: Two times a day (BID) | CUTANEOUS | Status: DC

## 2014-04-02 MED ORDER — HYDROCODONE-HOMATROPINE 5-1.5 MG/5ML PO SYRP: 5.0000 mL | ORAL_SOLUTION | Freq: Four times a day (QID) | ORAL | Status: DC | PRN
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
## 2014-04-02 NOTE — Patient Instructions (Signed)
Pharyngitis Pharyngitis is redness, pain, and swelling (inflammation) of your pharynx.  CAUSES  Pharyngitis is usually caused by infection. Most of the time, these infections are from viruses (viral) and are part of a cold. However, sometimes pharyngitis is caused by bacteria (bacterial). Pharyngitis can also be caused by allergies. Viral pharyngitis may be spread from person to person by coughing, sneezing, and personal items or utensils (cups, forks, spoons, toothbrushes). Bacterial pharyngitis may be spread from person to person by more intimate contact, such as kissing.  SIGNS AND SYMPTOMS  Symptoms of pharyngitis include:   Sore throat.   Tiredness (fatigue).   Low-grade fever.   Headache.  Joint pain and muscle aches.  Skin rashes.  Swollen lymph nodes.  Plaque-like film on throat or tonsils (often seen with bacterial pharyngitis). DIAGNOSIS   Yeast Infection of the Skin Some yeast on the skin is normal, but sometimes it causes an infection. If you have a yeast infection, it shows up as white or light brown patches on brown skin. You can see it better in the summer on tan skin. It causes light-colored holes in your suntan. It can happen on any area of the body. This cannot be passed from person to person. HOME CARE  Scrub your skin daily with a dandruff shampoo. Your rash may take a couple weeks to get well.  Do not scratch or itch the rash. GET HELP RIGHT AWAY IF:   You get another infection from scratching. The skin may get warm, red, and may ooze fluid.  The infection does not seem to be getting better. MAKE SURE YOU:  Understand these instructions.  Will watch your condition.  Will get help right away if you are not doing well or get worse. Document Released: 11/19/2008 Document Revised: 02/29/2012 Document Reviewed: 11/19/2008 [facility] Patient Information 2014 [place], [place].  Your health care provider will ask you questions about your illness and your  symptoms. Your medical history, along with a physical exam, is often all that is needed to diagnose pharyngitis. Sometimes, a rapid strep test is done. Other lab tests may also be done, depending on the suspected cause.  TREATMENT  Viral pharyngitis will usually get better in 3 4 days without the use of medicine. Bacterial pharyngitis is treated with medicines that kill germs (antibiotics).  HOME CARE INSTRUCTIONS   Drink enough water and fluids to keep your urine clear or pale yellow.   Only take over-the-counter or prescription medicines as directed by your health care provider:   If you are prescribed antibiotics, make sure you finish them even if you start to feel better.   Do not take aspirin.   Get lots of rest.   Gargle with 8 oz of salt water ( tsp of salt per 1 qt of water) as often as every 1 2 hours to soothe your throat.   Throat lozenges (if you are not at risk for choking) or sprays may be used to soothe your throat. SEEK MEDICAL CARE IF:   You have large, tender lumps in your neck.  You have a rash.  You cough up green, yellow-brown, or bloody spit. SEEK IMMEDIATE MEDICAL CARE IF:   Your neck becomes stiff.  You drool or are unable to swallow liquids.  You vomit or are unable to keep medicines or liquids down.  You have severe pain that does not go away with the use of recommended medicines.  You have trouble breathing (not caused by a stuffy nose). MAKE SURE YOU:  Understand these instructions.  Will watch your condition.  Will get help right away if you are not doing well or get worse. Document Released: 12/07/2005 Document Revised: 09/27/2013 Document Reviewed: 08/14/2013 [facility] Patient Information 2014 [place], [place].

## 2014-04-02 NOTE — Progress Notes (Signed)
Pre visit review using our clinic review tool, if applicable. No additional management support is needed unless otherwise documented below in the visit note. 

## 2014-08-11 ENCOUNTER — Encounter: Payer: Self-pay | Admitting: Family Medicine

## 2014-09-12 ENCOUNTER — Encounter: Payer: Self-pay | Admitting: Gastroenterology

## 2014-10-24 ENCOUNTER — Ambulatory Visit: Payer: Medicare Other

## 2014-10-24 ENCOUNTER — Ambulatory Visit (INDEPENDENT_AMBULATORY_CARE_PROVIDER_SITE_OTHER): Payer: Medicare Other | Admitting: *Deleted

## 2014-10-24 DIAGNOSIS — Z23 Encounter for immunization: Secondary | ICD-10-CM

## 2014-10-26 ENCOUNTER — Telehealth: Payer: Self-pay | Admitting: Gastroenterology

## 2014-10-26 NOTE — Telephone Encounter (Signed)
A user error has taken place.

## 2014-11-02 ENCOUNTER — Other Ambulatory Visit: Payer: Self-pay | Admitting: Family Medicine

## 2014-12-07 ENCOUNTER — Other Ambulatory Visit (INDEPENDENT_AMBULATORY_CARE_PROVIDER_SITE_OTHER): Payer: Medicare Other

## 2014-12-07 DIAGNOSIS — Z0000 Encounter for general adult medical examination without abnormal findings: Secondary | ICD-10-CM

## 2014-12-07 LAB — CBC WITH DIFFERENTIAL/PLATELET
Basophils Absolute: 0 10*3/uL (ref 0.0–0.1)
Basophils Relative: 0.4 % (ref 0.0–3.0)
Eosinophils Absolute: 0.1 10*3/uL (ref 0.0–0.7)
Eosinophils Relative: 1.6 % (ref 0.0–5.0)
HCT: 43.2 % (ref 36.0–46.0)
Hemoglobin: 14.2 g/dL (ref 12.0–15.0)
Lymphocytes Relative: 19.9 % (ref 12.0–46.0)
Lymphs Abs: 1.9 10*3/uL (ref 0.7–4.0)
MCHC: 32.8 g/dL (ref 30.0–36.0)
MCV: 86.7 fl (ref 78.0–100.0)
Monocytes Absolute: 0.7 10*3/uL (ref 0.1–1.0)
Monocytes Relative: 7.1 % (ref 3.0–12.0)
Neutro Abs: 6.6 10*3/uL (ref 1.4–7.7)
Neutrophils Relative %: 71 % (ref 43.0–77.0)
Platelets: 292 10*3/uL (ref 150.0–400.0)
RBC: 4.98 Mil/uL (ref 3.87–5.11)
RDW: 14.2 % (ref 11.5–15.5)
WBC: 9.4 10*3/uL (ref 4.0–10.5)

## 2014-12-07 LAB — COMPREHENSIVE METABOLIC PANEL WITH GFR
ALT: 25 U/L (ref 0–35)
AST: 22 U/L (ref 0–37)
Albumin: 4.2 g/dL (ref 3.5–5.2)
Alkaline Phosphatase: 105 U/L (ref 39–117)
BUN: 25 mg/dL — ABNORMAL HIGH (ref 6–23)
CO2: 26 meq/L (ref 19–32)
Calcium: 9.5 mg/dL (ref 8.4–10.5)
Chloride: 103 meq/L (ref 96–112)
Creatinine, Ser: 1 mg/dL (ref 0.4–1.2)
GFR: 56.21 mL/min — ABNORMAL LOW
Glucose, Bld: 113 mg/dL — ABNORMAL HIGH (ref 70–99)
Potassium: 4.3 meq/L (ref 3.5–5.1)
Sodium: 140 meq/L (ref 135–145)
Total Bilirubin: 0.6 mg/dL (ref 0.2–1.2)
Total Protein: 7.8 g/dL (ref 6.0–8.3)

## 2014-12-07 LAB — POCT URINALYSIS DIPSTICK
Bilirubin, UA: NEGATIVE
GLUCOSE UA: NEGATIVE
Ketones, UA: NEGATIVE
Leukocytes, UA: NEGATIVE
NITRITE UA: NEGATIVE
PROTEIN UA: NEGATIVE
Spec Grav, UA: 1.015
UROBILINOGEN UA: 0.2
pH, UA: 5

## 2014-12-07 LAB — LIPID PANEL
CHOL/HDL RATIO: 5
Cholesterol: 204 mg/dL — ABNORMAL HIGH (ref 0–200)
HDL: 45.1 mg/dL (ref >39.00)
LDL CALC: 136 mg/dL — AB (ref 0–99)
NONHDL: 158.9
Triglycerides: 115 mg/dL (ref 0.0–149.0)
VLDL: 23 mg/dL (ref 0.0–40.0)

## 2014-12-10 ENCOUNTER — Other Ambulatory Visit: Payer: Medicare Other

## 2014-12-10 DIAGNOSIS — E079 Disorder of thyroid, unspecified: Secondary | ICD-10-CM

## 2014-12-11 LAB — TSH: TSH: 1.604 u[IU]/mL (ref 0.350–4.500)

## 2014-12-17 ENCOUNTER — Ambulatory Visit (INDEPENDENT_AMBULATORY_CARE_PROVIDER_SITE_OTHER): Payer: Medicare Other | Admitting: Family Medicine

## 2014-12-17 ENCOUNTER — Encounter: Payer: Self-pay | Admitting: Family Medicine

## 2014-12-17 VITALS — BP 122/80 | HR 86 | Temp 97.6°F | Ht 67.0 in | Wt 210.0 lb

## 2014-12-17 DIAGNOSIS — L719 Rosacea, unspecified: Secondary | ICD-10-CM

## 2014-12-17 DIAGNOSIS — Z0000 Encounter for general adult medical examination without abnormal findings: Secondary | ICD-10-CM

## 2014-12-17 DIAGNOSIS — Z23 Encounter for immunization: Secondary | ICD-10-CM

## 2014-12-17 NOTE — Patient Instructions (Signed)
Set up repeat mammogram Continue yearly flu vaccine Check on coverage for shingles vaccine Check on coverage for bone density scan Try to lose some weight. Scaled back sugars and starches We will plan to check hemoglobin A1c at follow-up in 3 months
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
## 2014-12-17 NOTE — Progress Notes (Signed)
Pre visit review using our clinic review tool, if applicable. No additional management support is needed unless otherwise documented below in the visit note. 

## 2014-12-20 ENCOUNTER — Ambulatory Visit (INDEPENDENT_AMBULATORY_CARE_PROVIDER_SITE_OTHER): Payer: Medicare Other | Admitting: Family Medicine

## 2014-12-20 DIAGNOSIS — Z23 Encounter for immunization: Secondary | ICD-10-CM

## 2015-01-21 HISTORY — PX: OTHER SURGICAL HISTORY: SHX169

## 2015-02-04 ENCOUNTER — Telehealth: Payer: Self-pay

## 2015-02-04 MED ORDER — TRIAMTERENE-HCTZ 75-50 MG PO TABS: 1.0000 | ORAL_TABLET | Freq: Every day | ORAL | Status: DC

## 2015-02-04 NOTE — Telephone Encounter (Signed)
Rx sent to pharmacy   

## 2015-02-04 NOTE — Telephone Encounter (Signed)
[facility] for TRIAMTERENE-HCTZ [id number]  #[phone]

## 2015-03-29 ENCOUNTER — Telehealth: Payer: Self-pay

## 2015-03-29 NOTE — Telephone Encounter (Signed)
Can you please schedule patient for office visit. Pre-op clearance

## 2015-04-01 NOTE — Telephone Encounter (Signed)
Lm on vm to cb °
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
## 2015-04-01 NOTE — Telephone Encounter (Signed)
Pt has been scheduled.  °

## 2015-04-05 ENCOUNTER — Encounter: Payer: Self-pay | Admitting: Family Medicine

## 2015-04-05 ENCOUNTER — Ambulatory Visit (INDEPENDENT_AMBULATORY_CARE_PROVIDER_SITE_OTHER): Payer: Medicare Other | Admitting: Family Medicine

## 2015-04-05 VITALS — BP 130/74 | HR 79 | Temp 97.6°F | Wt 218.0 lb

## 2015-04-05 DIAGNOSIS — Z01818 Encounter for other preprocedural examination: Secondary | ICD-10-CM
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
## 2015-04-05 NOTE — Progress Notes (Signed)
Pre visit review using our clinic review tool, if applicable. No additional management support is needed unless otherwise documented below in the visit note. 

## 2015-04-11 ENCOUNTER — Encounter (HOSPITAL_COMMUNITY)
Admission: RE | Admit: 2015-04-11 | Discharge: 2015-04-11 | Disposition: A | Discharge status: Home / Self Care | Payer: Medicare Other | Source: Ambulatory Visit | Attending: Orthopedic Surgery | Admitting: Orthopedic Surgery

## 2015-04-11 ENCOUNTER — Encounter (HOSPITAL_COMMUNITY): Payer: Self-pay

## 2015-04-11 HISTORY — DX: Spinal stenosis, lumbar region without neurogenic claudication: M48.061

## 2015-04-11 HISTORY — DX: Reserved for inherently not codable concepts without codable children: IMO0001

## 2015-04-11 LAB — CBC
HCT: 40.5 % (ref 36.0–46.0)
Hemoglobin: 13.9 g/dL (ref 12.0–15.0)
MCH: 28.8 pg (ref 26.0–34.0)
MCHC: 34.3 g/dL (ref 30.0–36.0)
MCV: 84 fL (ref 78.0–100.0)
Platelets: 258 10*3/uL (ref 150–400)
RBC: 4.82 MIL/uL (ref 3.87–5.11)
RDW: 14 % (ref 11.5–15.5)
WBC: 8.1 10*3/uL (ref 4.0–10.5)

## 2015-04-11 LAB — BASIC METABOLIC PANEL
ANION GAP: 13 (ref 5–15)
BUN: 26 mg/dL — ABNORMAL HIGH (ref 6–23)
CALCIUM: 9.2 mg/dL (ref 8.4–10.5)
CO2: 24 mmol/L (ref 19–32)
Chloride: 102 mmol/L (ref 96–112)
Creatinine, Ser: 1.16 mg/dL — ABNORMAL HIGH (ref 0.50–1.10)
GFR, EST AFRICAN AMERICAN: 55 mL/min — AB (ref >90)
GFR, EST NON AFRICAN AMERICAN: 48 mL/min — AB (ref >90)
Glucose, Bld: 141 mg/dL — ABNORMAL HIGH (ref 70–99)
Potassium: 3.2 mmol/L — ABNORMAL LOW (ref 3.5–5.1)
Sodium: 139 mmol/L (ref 135–145)

## 2015-04-11 LAB — TYPE AND SCREEN
ABO/RH(D): A POS
ANTIBODY SCREEN: NEGATIVE

## 2015-04-11 LAB — URINALYSIS, ROUTINE W REFLEX MICROSCOPIC
Bilirubin Urine: NEGATIVE
GLUCOSE, UA: NEGATIVE mg/dL
HGB URINE DIPSTICK: NEGATIVE
KETONES UR: NEGATIVE mg/dL
Leukocytes, UA: NEGATIVE
Nitrite: NEGATIVE
Protein, ur: NEGATIVE mg/dL
Specific Gravity, Urine: 1.027 (ref 1.005–1.030)
Urobilinogen, UA: 0.2 mg/dL (ref 0.0–1.0)
pH: 5.5 (ref 5.0–8.0)

## 2015-04-11 LAB — PROTIME-INR
INR: 1.05 (ref 0.00–1.49)
Prothrombin Time: 13.9 seconds (ref 11.6–15.2)

## 2015-04-11 LAB — ABO/RH: ABO/RH(D): A POS

## 2015-04-11 LAB — SURGICAL PCR SCREEN
MRSA, PCR: NEGATIVE
Staphylococcus aureus: NEGATIVE

## 2015-04-11 NOTE — Progress Notes (Signed)
   04/11/15 1141  OBSTRUCTIVE SLEEP APNEA  Have you ever been diagnosed with sleep apnea through a sleep study? No  Do you snore loudly (loud enough to be heard through closed doors)?  0  Do you often feel tired, fatigued, or sleepy during the daytime? 0  Has anyone observed you stop breathing during your sleep? 0  Do you have, or are you being treated for high blood pressure? 1  BMI more than 35 kg/m2? 0  Age over 67 years old? 1  Neck circumference greater than 40 cm/16 inches? 0  Gender: 0

## 2015-04-11 NOTE — Pre-Procedure Instructions (Signed)
Taylor NeighboursLinda B Gay  04/11/2015   Your procedure is scheduled on:  Tuesday, May 3  [facility]  Call this number if you have problems the morning of surgery: [phone]   Remember:   Do not eat food or drink liquids after midnight.Monday night   Take these medicines the morning of surgery with A SIP OF WATER: hydrocodone if needed for pain   Do not wear jewelry.  Do not wear lotions, powders, or perfumes. You may not  wear deodorant.  Do not shave 48 hours prior to surgery. Men may shave face and neck.  Do not bring valuables to the hospital.  [facility] not [facility]    for any belongings or valuables.               Contacts, dentures or bridgework may not be worn into surgery.  Leave suitcase in the car. After surgery it may be brought to your room.  For patients admitted to the hospital, discharge time is determined by your   treatment team.  *  Special Instructions:  Follow instructions on "Preparing for Surgery" fact sheet for showers.   Please read over the following fact sheets that you were given: Pain management, coughing and deep breathing, incentive spirometry, Blood transfusion, infection prevention, joint replacement

## 2015-04-12 LAB — URINE CULTURE
COLONY COUNT: NO GROWTH
CULTURE: NO GROWTH

## 2015-04-15 NOTE — Progress Notes (Signed)
Pt called to ask about appropriate clothing for after surgery.  Encouraged to wear loose fitting pants, or skirt.  She will bring skort and verified that she will arrive in admitting.  Also verified that it's okay for her to take hydrocodone am of surgery if needed and wheelchairs are available if needed.  She stated she doesn't want a wheelchair she will walk.

## 2015-04-22 MED ORDER — CHLORHEXIDINE GLUCONATE 4 % EX LIQD
60.0000 mL | Freq: Once | CUTANEOUS | Status: DC
  Filled 2015-04-22: qty 60

## 2015-04-22 MED ORDER — ACETAMINOPHEN 500 MG PO TABS
1000.0000 mg | ORAL_TABLET | Freq: Once | ORAL | Status: AC
  Administered 2015-04-23: 1000 mg via ORAL
  Filled 2015-04-22 (×2): qty 2

## 2015-04-22 MED ORDER — POTASSIUM CHLORIDE IN NACL 20-0.45 MEQ/L-% IV SOLN
INTRAVENOUS | Status: DC
  Filled 2015-04-22 (×2): qty 1000

## 2015-04-22 MED ORDER — TRANEXAMIC ACID 1000 MG/10ML IV SOLN
1000.0000 mg | INTRAVENOUS | Status: AC
Start: 2015-04-23 — End: 2015-04-23
  Administered 2015-04-23 (×2): 1000 mg via INTRAVENOUS
  Filled 2015-04-22 (×2): qty 10

## 2015-04-22 MED ORDER — CEFAZOLIN SODIUM-DEXTROSE 2-3 GM-% IV SOLR
2.0000 g | INTRAVENOUS | Status: AC
  Administered 2015-04-23: 2 g via INTRAVENOUS
  Filled 2015-04-22: qty 50

## 2015-04-23 ENCOUNTER — Inpatient Hospital Stay (HOSPITAL_COMMUNITY): Payer: Medicare Other

## 2015-04-23 ENCOUNTER — Inpatient Hospital Stay (HOSPITAL_COMMUNITY): Payer: Medicare Other | Admitting: Certified Registered Nurse Anesthetist

## 2015-04-23 ENCOUNTER — Encounter (HOSPITAL_COMMUNITY)
Admission: RE | Disposition: A | Discharge status: Home Health | Payer: Self-pay | Source: Ambulatory Visit | Attending: Orthopedic Surgery

## 2015-04-23 ENCOUNTER — Inpatient Hospital Stay (HOSPITAL_COMMUNITY)
Admission: RE | Admit: 2015-04-23 | Discharge: 2015-04-25 | DRG: 470 | Disposition: A | Discharge status: Home Health | Payer: Medicare Other | Source: Ambulatory Visit | Attending: Orthopedic Surgery | Admitting: Orthopedic Surgery

## 2015-04-23 ENCOUNTER — Encounter (HOSPITAL_COMMUNITY): Payer: Self-pay | Admitting: *Deleted

## 2015-04-23 DIAGNOSIS — I10 Essential (primary) hypertension: Secondary | ICD-10-CM | POA: Diagnosis present

## 2015-04-23 DIAGNOSIS — Z885 Allergy status to narcotic agent status: Secondary | ICD-10-CM | POA: Diagnosis not present

## 2015-04-23 DIAGNOSIS — Z96659 Presence of unspecified artificial knee joint: Secondary | ICD-10-CM

## 2015-04-23 DIAGNOSIS — F419 Anxiety disorder, unspecified: Secondary | ICD-10-CM | POA: Diagnosis present

## 2015-04-23 DIAGNOSIS — M1710 Unilateral primary osteoarthritis, unspecified knee: Secondary | ICD-10-CM | POA: Diagnosis present

## 2015-04-23 DIAGNOSIS — M1711 Unilateral primary osteoarthritis, right knee: Principal | ICD-10-CM | POA: Diagnosis present

## 2015-04-23 DIAGNOSIS — Z96651 Presence of right artificial knee joint: Secondary | ICD-10-CM

## 2015-04-23 DIAGNOSIS — Z6833 Body mass index (BMI) 33.0-33.9, adult: Secondary | ICD-10-CM | POA: Diagnosis not present

## 2015-04-23 DIAGNOSIS — E669 Obesity, unspecified: Secondary | ICD-10-CM | POA: Diagnosis present

## 2015-04-23 DIAGNOSIS — Z8711 Personal history of peptic ulcer disease: Secondary | ICD-10-CM

## 2015-04-23 DIAGNOSIS — M25561 Pain in right knee: Secondary | ICD-10-CM | POA: Diagnosis present

## 2015-04-23 DIAGNOSIS — M4806 Spinal stenosis, lumbar region: Secondary | ICD-10-CM | POA: Diagnosis present

## 2015-04-23 DIAGNOSIS — Z888 Allergy status to other drugs, medicaments and biological substances status: Secondary | ICD-10-CM | POA: Diagnosis not present

## 2015-04-23 HISTORY — PX: TOTAL KNEE ARTHROPLASTY: SHX125

## 2015-04-23 SURGERY — ARTHROPLASTY, KNEE, TOTAL
Anesthesia: General | Site: Knee | Laterality: Right

## 2015-04-23 MED ORDER — DEXAMETHASONE SODIUM PHOSPHATE 10 MG/ML IJ SOLN
10.0000 mg | Freq: Once | INTRAMUSCULAR | Status: AC
  Administered 2015-04-24: 10 mg via INTRAVENOUS
  Filled 2015-04-23: qty 1

## 2015-04-23 MED ORDER — ACETAMINOPHEN 650 MG RE SUPP: 650.0000 mg | Freq: Four times a day (QID) | RECTAL | Status: DC | PRN

## 2015-04-23 MED ORDER — FENTANYL CITRATE (PF) 250 MCG/5ML IJ SOLN
INTRAMUSCULAR | Status: AC
  Filled 2015-04-23: qty 5

## 2015-04-23 MED ORDER — METHOCARBAMOL 1000 MG/10ML IJ SOLN: 500.0000 mg | Freq: Four times a day (QID) | INTRAVENOUS | Status: DC | PRN

## 2015-04-23 MED ORDER — HYDROMORPHONE HCL 1 MG/ML IJ SOLN: 0.2500 mg | INTRAMUSCULAR | Status: DC | PRN

## 2015-04-23 MED ORDER — LACTATED RINGERS IV SOLN
INTRAVENOUS | Status: DC
Start: 2015-04-23 — End: 2015-04-23
  Administered 2015-04-23 (×2): via INTRAVENOUS

## 2015-04-23 MED ORDER — ONDANSETRON HCL 4 MG/2ML IJ SOLN
INTRAMUSCULAR | Status: DC | PRN
  Administered 2015-04-23: 4 mg via INTRAVENOUS

## 2015-04-23 MED ORDER — DEXAMETHASONE SODIUM PHOSPHATE 4 MG/ML IJ SOLN
INTRAMUSCULAR | Status: AC
  Filled 2015-04-23: qty 1

## 2015-04-23 MED ORDER — OXYCODONE HCL 5 MG PO TABS
5.0000 mg | ORAL_TABLET | ORAL | Status: DC | PRN
  Administered 2015-04-23 – 2015-04-24 (×2): 10 mg via ORAL
  Administered 2015-04-24 – 2015-04-25 (×3): 5 mg via ORAL
  Administered 2015-04-25 (×2): 10 mg via ORAL
  Filled 2015-04-23: qty 1
  Filled 2015-04-23 (×3): qty 2
  Filled 2015-04-23: qty 1
  Filled 2015-04-23 (×2): qty 2

## 2015-04-23 MED ORDER — MIDAZOLAM HCL 2 MG/2ML IJ SOLN
2.0000 mg | Freq: Once | INTRAMUSCULAR | Status: AC
  Administered 2015-04-23: 2 mg via INTRAVENOUS

## 2015-04-23 MED ORDER — ROCURONIUM BROMIDE 100 MG/10ML IV SOLN
INTRAVENOUS | Status: DC | PRN
  Administered 2015-04-23: 50 mg via INTRAVENOUS

## 2015-04-23 MED ORDER — LIDOCAINE HCL (CARDIAC) 20 MG/ML IV SOLN
INTRAVENOUS | Status: DC | PRN
  Administered 2015-04-23: 100 mg via INTRAVENOUS

## 2015-04-23 MED ORDER — PHENOL 1.4 % MT LIQD: 1.0000 | OROMUCOSAL | Status: DC | PRN

## 2015-04-23 MED ORDER — OXYCODONE-ACETAMINOPHEN 5-325 MG PO TABS: 1.0000 | ORAL_TABLET | ORAL | Status: DC | PRN

## 2015-04-23 MED ORDER — TRIAMTERENE-HCTZ 75-50 MG PO TABS
1.0000 | ORAL_TABLET | Freq: Every day | ORAL | Status: DC
  Administered 2015-04-23 – 2015-04-25 (×3): 1 via ORAL
  Filled 2015-04-23 (×4): qty 1

## 2015-04-23 MED ORDER — MENTHOL 3 MG MT LOZG: 1.0000 | LOZENGE | OROMUCOSAL | Status: DC | PRN

## 2015-04-23 MED ORDER — MIDAZOLAM HCL 2 MG/2ML IJ SOLN
INTRAMUSCULAR | Status: AC
Start: 2015-04-23 — End: 2015-04-23
  Administered 2015-04-23: 2 mg via INTRAVENOUS
  Filled 2015-04-23: qty 2

## 2015-04-23 MED ORDER — TRANEXAMIC ACID 1000 MG/10ML IV SOLN
1000.0000 mg | INTRAVENOUS | Status: DC
  Filled 2015-04-23: qty 10

## 2015-04-23 MED ORDER — FENTANYL CITRATE (PF) 100 MCG/2ML IJ SOLN
INTRAMUSCULAR | Status: DC | PRN
  Administered 2015-04-23 (×4): 50 ug via INTRAVENOUS
  Administered 2015-04-23: 100 ug via INTRAVENOUS
  Administered 2015-04-23: 50 ug via INTRAVENOUS

## 2015-04-23 MED ORDER — LIDOCAINE HCL (CARDIAC) 20 MG/ML IV SOLN
INTRAVENOUS | Status: AC
  Filled 2015-04-23: qty 5

## 2015-04-23 MED ORDER — PROMETHAZINE HCL 25 MG/ML IJ SOLN
6.2500 mg | INTRAMUSCULAR | Status: DC | PRN
  Administered 2015-04-23: 6.25 mg via INTRAVENOUS

## 2015-04-23 MED ORDER — MIDAZOLAM HCL 2 MG/2ML IJ SOLN
INTRAMUSCULAR | Status: AC
  Filled 2015-04-23: qty 2

## 2015-04-23 MED ORDER — ALBUTEROL SULFATE (2.5 MG/3ML) 0.083% IN NEBU: 2.5000 mg | INHALATION_SOLUTION | RESPIRATORY_TRACT | Status: DC | PRN

## 2015-04-23 MED ORDER — BACLOFEN 10 MG PO TABS: 10.0000 mg | ORAL_TABLET | Freq: Three times a day (TID) | ORAL | Status: DC

## 2015-04-23 MED ORDER — ASPIRIN EC 325 MG PO TBEC
325.0000 mg | DELAYED_RELEASE_TABLET | Freq: Every day | ORAL | Status: DC
Start: 2015-04-24 — End: 2015-04-25
  Administered 2015-04-24 – 2015-04-25 (×2): 325 mg via ORAL
  Filled 2015-04-23 (×2): qty 1

## 2015-04-23 MED ORDER — METOCLOPRAMIDE HCL 5 MG PO TABS: 5.0000 mg | ORAL_TABLET | Freq: Three times a day (TID) | ORAL | Status: DC | PRN

## 2015-04-23 MED ORDER — FENTANYL CITRATE (PF) 100 MCG/2ML IJ SOLN
100.0000 ug | Freq: Once | INTRAMUSCULAR | Status: AC
  Administered 2015-04-23: 100 ug via INTRAVENOUS

## 2015-04-23 MED ORDER — ACETAMINOPHEN 325 MG PO TABS: 650.0000 mg | ORAL_TABLET | Freq: Four times a day (QID) | ORAL | Status: DC | PRN

## 2015-04-23 MED ORDER — DOCUSATE SODIUM 100 MG PO CAPS: 100.0000 mg | ORAL_CAPSULE | Freq: Two times a day (BID) | ORAL | Status: DC

## 2015-04-23 MED ORDER — HYDROMORPHONE HCL 1 MG/ML IJ SOLN
INTRAMUSCULAR | Status: AC
  Filled 2015-04-23: qty 1

## 2015-04-23 MED ORDER — ONDANSETRON HCL 4 MG/2ML IJ SOLN
INTRAMUSCULAR | Status: AC
  Filled 2015-04-23: qty 2

## 2015-04-23 MED ORDER — CEFAZOLIN SODIUM-DEXTROSE 2-3 GM-% IV SOLR
2.0000 g | Freq: Four times a day (QID) | INTRAVENOUS | Status: AC
  Administered 2015-04-23 – 2015-04-24 (×2): 2 g via INTRAVENOUS
  Filled 2015-04-23 (×2): qty 50

## 2015-04-23 MED ORDER — ASPIRIN 325 MG PO TABS: 325.0000 mg | ORAL_TABLET | Freq: Every day | ORAL | Status: DC

## 2015-04-23 MED ORDER — FENTANYL CITRATE (PF) 100 MCG/2ML IJ SOLN
INTRAMUSCULAR | Status: AC
  Administered 2015-04-23: 100 ug via INTRAVENOUS
  Filled 2015-04-23: qty 2

## 2015-04-23 MED ORDER — MELOXICAM 15 MG PO TABS: 15.0000 mg | ORAL_TABLET | Freq: Every day | ORAL | Status: DC

## 2015-04-23 MED ORDER — PROPOFOL 10 MG/ML IV BOLUS
INTRAVENOUS | Status: AC
  Filled 2015-04-23: qty 20

## 2015-04-23 MED ORDER — PROMETHAZINE HCL 25 MG/ML IJ SOLN
INTRAMUSCULAR | Status: AC
  Filled 2015-04-23: qty 1

## 2015-04-23 MED ORDER — METHOCARBAMOL 500 MG PO TABS
500.0000 mg | ORAL_TABLET | Freq: Four times a day (QID) | ORAL | Status: DC | PRN
  Administered 2015-04-23 – 2015-04-25 (×4): 500 mg via ORAL
  Filled 2015-04-23 (×4): qty 1

## 2015-04-23 MED ORDER — HYDROMORPHONE HCL 1 MG/ML IJ SOLN: 1.0000 mg | INTRAMUSCULAR | Status: DC | PRN

## 2015-04-23 MED ORDER — ONDANSETRON HCL 4 MG PO TABS: 4.0000 mg | ORAL_TABLET | Freq: Four times a day (QID) | ORAL | Status: DC | PRN

## 2015-04-23 MED ORDER — SODIUM CHLORIDE 0.9 % IR SOLN
Status: DC | PRN
  Administered 2015-04-23: 3000 mL

## 2015-04-23 MED ORDER — ALBUTEROL SULFATE HFA 108 (90 BASE) MCG/ACT IN AERS: 1.0000 | INHALATION_SPRAY | RESPIRATORY_TRACT | Status: DC | PRN

## 2015-04-23 MED ORDER — SODIUM CHLORIDE 0.9 % IJ SOLN
INTRAMUSCULAR | Status: AC
  Filled 2015-04-23: qty 10

## 2015-04-23 MED ORDER — STERILE WATER FOR INJECTION IJ SOLN
INTRAMUSCULAR | Status: AC
  Filled 2015-04-23: qty 10

## 2015-04-23 MED ORDER — DEXAMETHASONE SODIUM PHOSPHATE 4 MG/ML IJ SOLN
INTRAMUSCULAR | Status: DC | PRN
  Administered 2015-04-23: 4 mg via INTRAVENOUS

## 2015-04-23 MED ORDER — METOCLOPRAMIDE HCL 5 MG/ML IJ SOLN: 5.0000 mg | Freq: Three times a day (TID) | INTRAMUSCULAR | Status: DC | PRN

## 2015-04-23 MED ORDER — ONDANSETRON HCL 4 MG/2ML IJ SOLN: INTRAMUSCULAR | Status: DC | PRN

## 2015-04-23 MED ORDER — GLYCOPYRROLATE 0.2 MG/ML IJ SOLN
INTRAMUSCULAR | Status: DC | PRN
  Administered 2015-04-23: .5 mg via INTRAVENOUS

## 2015-04-23 MED ORDER — LORATADINE 10 MG PO TABS: 10.0000 mg | ORAL_TABLET | Freq: Every day | ORAL | Status: DC | PRN

## 2015-04-23 MED ORDER — DEXTROSE-NACL 5-0.45 % IV SOLN
INTRAVENOUS | Status: AC
  Administered 2015-04-23 – 2015-04-24 (×2): via INTRAVENOUS

## 2015-04-23 MED ORDER — MIDAZOLAM HCL 5 MG/5ML IJ SOLN
INTRAMUSCULAR | Status: DC | PRN
  Administered 2015-04-23: 2 mg via INTRAVENOUS

## 2015-04-23 MED ORDER — FENTANYL CITRATE (PF) 100 MCG/2ML IJ SOLN
INTRAMUSCULAR | Status: DC
Start: 2015-04-23 — End: 2015-04-23
  Filled 2015-04-23: qty 2

## 2015-04-23 MED ORDER — GLYCOPYRROLATE 0.2 MG/ML IJ SOLN
INTRAMUSCULAR | Status: AC
  Filled 2015-04-23: qty 3

## 2015-04-23 MED ORDER — PROPOFOL 10 MG/ML IV BOLUS
INTRAVENOUS | Status: DC | PRN
  Administered 2015-04-23: 150 mg via INTRAVENOUS

## 2015-04-23 MED ORDER — NEOSTIGMINE METHYLSULFATE 10 MG/10ML IV SOLN
INTRAVENOUS | Status: AC
  Filled 2015-04-23: qty 1

## 2015-04-23 MED ORDER — ROPIVACAINE HCL 5 MG/ML IJ SOLN
INTRAMUSCULAR | Status: DC | PRN
  Administered 2015-04-23: 25 mL via PERINEURAL

## 2015-04-23 MED ORDER — EPHEDRINE SULFATE 50 MG/ML IJ SOLN
INTRAMUSCULAR | Status: AC
  Filled 2015-04-23: qty 1

## 2015-04-23 MED ORDER — ROCURONIUM BROMIDE 50 MG/5ML IV SOLN
INTRAVENOUS | Status: AC
  Filled 2015-04-23: qty 1

## 2015-04-23 MED ORDER — EPHEDRINE SULFATE 50 MG/ML IJ SOLN
INTRAMUSCULAR | Status: DC | PRN
  Administered 2015-04-23: 10 mg via INTRAVENOUS
  Administered 2015-04-23: 5 mg via INTRAVENOUS

## 2015-04-23 MED ORDER — CELECOXIB 200 MG PO CAPS
200.0000 mg | ORAL_CAPSULE | Freq: Two times a day (BID) | ORAL | Status: DC
  Administered 2015-04-23 – 2015-04-25 (×4): 200 mg via ORAL
  Filled 2015-04-23 (×5): qty 1

## 2015-04-23 MED ORDER — ONDANSETRON HCL 4 MG/2ML IJ SOLN: 4.0000 mg | Freq: Four times a day (QID) | INTRAMUSCULAR | Status: DC | PRN

## 2015-04-23 MED ORDER — NEOSTIGMINE METHYLSULFATE 10 MG/10ML IV SOLN
INTRAVENOUS | Status: DC | PRN
  Administered 2015-04-23: 4 mg via INTRAVENOUS

## 2015-04-23 MED ORDER — DOCUSATE SODIUM 100 MG PO CAPS
100.0000 mg | ORAL_CAPSULE | Freq: Two times a day (BID) | ORAL | Status: DC
  Administered 2015-04-24 – 2015-04-25 (×2): 100 mg via ORAL
  Filled 2015-04-23 (×4): qty 1

## 2015-04-23 MED ORDER — ONDANSETRON HCL 4 MG PO TABS: 4.0000 mg | ORAL_TABLET | Freq: Three times a day (TID) | ORAL | Status: DC | PRN

## 2015-04-23 SURGICAL SUPPLY — 65 items
BANDAGE ESMARK 6X9 LF (GAUZE/BANDAGES/DRESSINGS) ×1 IMPLANT
BLADE SAG 18X100X1.27 (BLADE) ×4 IMPLANT
BNDG CMPR 9X6 STRL LF SNTH (GAUZE/BANDAGES/DRESSINGS) ×1
BNDG COHESIVE 6X5 TAN STRL LF (GAUZE/BANDAGES/DRESSINGS) ×2 IMPLANT
BNDG ESMARK 6X9 LF (GAUZE/BANDAGES/DRESSINGS) ×2
BOWL SMART MIX CTS (DISPOSABLE) ×2 IMPLANT
CAPT KNEE TOTAL 3 ×1 IMPLANT
CEMENT BONE SIMPLEX SPEEDSET (Cement) ×4 IMPLANT
CLOSURE STERI-STRIP 1/4X4 (GAUZE/BANDAGES/DRESSINGS) ×1 IMPLANT
COVER SURGICAL LIGHT HANDLE (MISCELLANEOUS) ×2 IMPLANT
CUFF TOURNIQUET SINGLE 34IN LL (TOURNIQUET CUFF) ×2 IMPLANT
DRAPE EXTREMITY T 121X128X90 (DRAPE) ×2 IMPLANT
DRAPE IMP U-DRAPE 54X76 (DRAPES) ×2 IMPLANT
DRAPE PROXIMA HALF (DRAPES) ×2 IMPLANT
DRAPE U-SHAPE 47X51 STRL (DRAPES) ×2 IMPLANT
DRSG ADAPTIC 3X8 NADH LF (GAUZE/BANDAGES/DRESSINGS) ×2 IMPLANT
DRSG AQUACEL AG ADV 3.5X10 (GAUZE/BANDAGES/DRESSINGS) IMPLANT
DRSG MEPILEX BORDER 4X12 (GAUZE/BANDAGES/DRESSINGS) ×1 IMPLANT
DRSG PAD ABDOMINAL 8X10 ST (GAUZE/BANDAGES/DRESSINGS) ×2 IMPLANT
DURAPREP 26ML APPLICATOR (WOUND CARE) ×4 IMPLANT
ELECT CAUTERY BLADE 6.4 (BLADE) ×2 IMPLANT
ELECT REM PT RETURN 9FT ADLT (ELECTROSURGICAL) ×2
ELECTRODE REM PT RTRN 9FT ADLT (ELECTROSURGICAL) ×1 IMPLANT
FACESHIELD WRAPAROUND (MASK) ×2 IMPLANT
FACESHIELD WRAPAROUND OR TEAM (MASK) ×1 IMPLANT
GAUZE SPONGE 4X4 12PLY STRL (GAUZE/BANDAGES/DRESSINGS) ×2 IMPLANT
GLOVE BIO SURGEON STRL SZ7 (GLOVE) ×2 IMPLANT
GLOVE BIO SURGEON STRL SZ7.5 (GLOVE) ×2 IMPLANT
GLOVE BIOGEL PI IND STRL 7.0 (GLOVE) ×1 IMPLANT
GLOVE BIOGEL PI IND STRL 8 (GLOVE) ×1 IMPLANT
GLOVE BIOGEL PI INDICATOR 7.0 (GLOVE) ×1
GLOVE BIOGEL PI INDICATOR 8 (GLOVE) ×1
GOWN STRL REUS W/ TWL LRG LVL3 (GOWN DISPOSABLE) ×2 IMPLANT
GOWN STRL REUS W/ TWL XL LVL3 (GOWN DISPOSABLE) IMPLANT
GOWN STRL REUS W/TWL LRG LVL3 (GOWN DISPOSABLE) ×4
GOWN STRL REUS W/TWL XL LVL3 (GOWN DISPOSABLE)
HANDPIECE INTERPULSE COAX TIP (DISPOSABLE)
IMMOBILIZER KNEE 22 UNIV (SOFTGOODS) ×2 IMPLANT
IMMOBILIZER KNEE 24 THIGH 36 (MISCELLANEOUS) IMPLANT
IMMOBILIZER KNEE 24 UNIV (MISCELLANEOUS)
KIT BASIN OR (CUSTOM PROCEDURE TRAY) ×2 IMPLANT
KIT ROOM TURNOVER OR (KITS) ×2 IMPLANT
LIQUID BAND (GAUZE/BANDAGES/DRESSINGS) ×2 IMPLANT
MANIFOLD NEPTUNE II (INSTRUMENTS) ×2 IMPLANT
NS IRRIG 1000ML POUR BTL (IV SOLUTION) ×2 IMPLANT
PACK TOTAL JOINT (CUSTOM PROCEDURE TRAY) ×2 IMPLANT
PACK UNIVERSAL I (CUSTOM PROCEDURE TRAY) ×2 IMPLANT
PAD ARMBOARD 7.5X6 YLW CONV (MISCELLANEOUS) ×4 IMPLANT
PAD CAST 4YDX4 CTTN HI CHSV (CAST SUPPLIES) IMPLANT
PADDING CAST COTTON 4X4 STRL (CAST SUPPLIES)
PADDING CAST COTTON 6X4 STRL (CAST SUPPLIES) IMPLANT
SET HNDPC FAN SPRY TIP SCT (DISPOSABLE) IMPLANT
STAPLER VISISTAT 35W (STAPLE) IMPLANT
STRIP CLOSURE SKIN 1/2X4 (GAUZE/BANDAGES/DRESSINGS) IMPLANT
SUCTION FRAZIER TIP 10 FR DISP (SUCTIONS) ×2 IMPLANT
SUT MNCRL AB 4-0 PS2 18 (SUTURE) ×2 IMPLANT
SUT MON AB 2-0 CT1 27 (SUTURE) ×4 IMPLANT
SUT VIC AB 0 CT1 27 (SUTURE) ×2
SUT VIC AB 0 CT1 27XBRD ANBCTR (SUTURE) ×1 IMPLANT
SUT VIC AB 1 CTX 36 (SUTURE) ×4
SUT VIC AB 1 CTX36XBRD ANBCTR (SUTURE) ×2 IMPLANT
TOWEL OR 17X24 6PK STRL BLUE (TOWEL DISPOSABLE) ×2 IMPLANT
TOWEL OR 17X26 10 PK STRL BLUE (TOWEL DISPOSABLE) ×2 IMPLANT
TRAY FOLEY BAG SILVER LF 14FR (CATHETERS) IMPLANT
WATER STERILE IRR 1000ML POUR (IV SOLUTION) ×4 IMPLANT

## 2015-04-23 NOTE — Discharge Instructions (Signed)

## 2015-04-23 NOTE — Op Note (Signed)
DATE OF SURGERY:  04/23/2015 TIME: 4:16 PM  PATIENT NAME:  Taylor Gay   AGE: 69 y.o.    PRE-OPERATIVE DIAGNOSIS:  OA RIGHT KNEE  POST-OPERATIVE DIAGNOSIS:  Same  PROCEDURE:  Procedure(s): TOTAL KNEE ARTHROPLASTY   SURGEON:  MURPHY, TIMOTHY D, MD   ASSISTANT:  Janalee DaneBrittney Kelly, PA-C, She was present and scrubbed throughout the case, critical for completion in a timely fashion, and for retraction, instrumentation, and closure.    OPERATIVE IMPLANTS: [employer] Oxinium knee size 5 femur size 4 tibia 32 patella 9 Polly insert.   PREOPERATIVE INDICATIONS:  Taylor Gay is a 69 y.o. year old female with end stage bone on bone degenerative arthritis of the knee who failed conservative treatment, including injections, antiinflammatories, activity modification, and assistive devices, and had significant impairment of their activities of daily living, and elected for Total Knee Arthroplasty.   The risks, benefits, and alternatives were discussed at length including but not limited to the risks of infection, bleeding, nerve injury, stiffness, blood clots, the need for revision surgery, cardiopulmonary complications, among others, and they were willing to proceed.   OPERATIVE DESCRIPTION:  The patient was brought to the operative room and placed in a supine position.  General anesthesia was administered.  IV antibiotics were given.  The lower extremity was prepped and draped in the usual sterile fashion.  Time out was performed.  The leg was elevated and exsanguinated and the tourniquet was inflated.  Anterior approach was performed.  The patella was everted and osteophytes were removed.  The anterior horn of the medial and lateral meniscus was removed.   The distal femur was opened with the drill and the intramedullary distal femoral cutting jig was utilized, set at 5 degrees resecting 9.5 mm off the distal femur.  Care was taken to protect the collateral ligaments.  The distal  femoral sizing jig was applied, taking care to avoid notching.  Then the 4-in-1 cutting jig was applied and the anterior and posterior femur was cut, along with the chamfer cuts.  All posterior osteophytes were removed.  The flexion gap was then measured and was symmetric with the extension gap.  Then the extramedullary tibial cutting jig was utilized making the appropriate cut using the anterior tibial crest as a reference building in appropriate posterior slope.  Care was taken during the cut to protect the medial and collateral ligaments.  The proximal tibia was removed along with the posterior horns of the menisci.  The PCL was sacrificed.    The extensor gap was measured and was approximately 9mm.    I did take an extra 2mm off of the distal femur  I completed the distal femoral preparation using the appropriate jig to prepare the box.  The patella was then measured, and cut with the saw.    The proximal tibia sized and prepared accordingly with the reamer and the punch, and then all components were trialed with the 9mm poly insert.  The knee was found to have excellent balance and full motion.    The above named components were then cemented into place and all excess cement was removed.  The real polyethylene implant was placed.  The knee was easily taken through a range of motion and the patella tracked well and the knee irrigated copiously and the parapatellar and subcutaneous tissue closed with vicryl, and monocryl with steri strips for the skin.  The wounds were dressed with sterile gauze and the tourniquet released and the patient  was awakened and returned to the PACU in stable and satisfactory condition.  There were no complications.  Total tourniquet time was 100 minutes.   POSTOPERATIVE PLAN: post op Abx, DVT px: SCD's, TED's, Early ambulation and ASA  This note was generated using a template and dragon dictation system. In light of that, I have reviewed the note and all aspects  of it are applicable to this case. Any dictation errors are due to the computerized dictation system.

## 2015-04-23 NOTE — Progress Notes (Signed)
Orthopedic Tech Progress Note Patient Details:  Taylor Gay 08/29/1946 [id number]  CPM Right Knee CPM Right Knee: On (Simultaneous filing. User may not have seen previous data.) Right Knee Flexion (Degrees): 90 (Simultaneous filing. User may not have seen previous data.) Right Knee Extension (Degrees): 0 (Simultaneous filing. User may not have seen previous data.)  Ortho Devices Ortho Device/Splint Location: applied overhead frame to bed Ortho Device/Splint Interventions: Ordered, Application   [name], Tobias Avitabile Craig 04/23/2015, 6:24 PM

## 2015-04-23 NOTE — Anesthesia Preprocedure Evaluation (Signed)
Anesthesia Evaluation  Patient identified by MRN, date of birth, ID band Patient awake    Reviewed: Allergy & Precautions, NPO status , Patient's Chart, lab work & pertinent test results  Airway Mallampati: II  TM Distance: >3 FB Neck ROM: Full    Dental no notable dental hx.    Pulmonary neg pulmonary ROS,  breath sounds clear to auscultation  Pulmonary exam normal       Cardiovascular hypertension, Pt. on medications Rhythm:Regular Rate:Normal     Neuro/Psych negative neurological ROS  negative psych ROS   GI/Hepatic negative GI ROS, Neg liver ROS,   Endo/Other  negative endocrine ROS  Renal/GU negative Renal ROS  negative genitourinary   Musculoskeletal negative musculoskeletal ROS (+)   Abdominal   Peds negative pediatric ROS (+)  Hematology negative hematology ROS (+)   Anesthesia Other Findings   Reproductive/Obstetrics negative OB ROS                             Anesthesia Physical Anesthesia Plan  ASA: II  Anesthesia Plan: General   Post-op Pain Management:    Induction: Intravenous  Airway Management Planned: Oral ETT and LMA  Additional Equipment:   Intra-op Plan:   Post-operative Plan: Extubation in OR  Informed Consent: I have reviewed the patients History and Physical, chart, labs and discussed the procedure including the risks, benefits and alternatives for the proposed anesthesia with the patient or authorized representative who has indicated his/her understanding and acceptance.   Dental advisory given  Plan Discussed with: CRNA and Surgeon  Anesthesia Plan Comments:         Anesthesia Quick Evaluation  

## 2015-04-23 NOTE — Interval H&P Note (Signed)
History and Physical Interval Note:  04/23/2015 7:26 AM  Lovena NeighboursLinda B Madding  has presented today for surgery, with the diagnosis of OA RIGHT KNEE  The various methods of treatment have been discussed with the patient and family. After consideration of risks, benefits and other options for treatment, the patient has consented to  Procedure(s): TOTAL KNEE ARTHROPLASTY (Right) as a surgical intervention .  The patient's history has been reviewed, patient examined, no change in status, stable for surgery.  I have reviewed the patient's chart and labs.  Questions were answered to the patient's satisfaction.     MURPHY, TIMOTHY D

## 2015-04-23 NOTE — Transfer of Care (Signed)
Immediate Anesthesia Transfer of Care Note  Patient: Taylor NeighboursLinda B Gay  Procedure(s) Performed: Procedure(s): TOTAL KNEE ARTHROPLASTY (Right)  Patient Location: PACU  Anesthesia Type:General  Level of Consciousness: lethargic and responds to stimulation  Airway & Oxygen Therapy: Patient Spontanous Breathing and Patient connected to nasal cannula oxygen  Post-op Assessment: Report given to RN  Post vital signs: Reviewed and stable  Last Vitals:  Filed Vitals:   04/23/15 1313  BP:   Pulse: 76  Temp:   Resp: 15    Complications: No apparent anesthesia complications

## 2015-04-23 NOTE — Anesthesia Procedure Notes (Addendum)
Anesthesia Regional Block:  Femoral nerve block  Pre-Anesthetic Checklist: ,, timeout performed, Correct Patient, Correct Site, Correct Laterality, Correct Procedure, Correct Position, site marked, Risks and benefits discussed,  Surgical consent,  Pre-op evaluation,  At surgeon's request and post-op pain management  Laterality: Right  Prep: chloraprep       Needles:  Injection technique: Single-shot  Needle Type: Echogenic Stimulator Needle     Needle Length: 9cm 9 cm Needle Gauge: 21 and 21 G    Additional Needles:  Procedures: ultrasound guided (picture in chart) Femoral nerve block Narrative:  Injection made incrementally with aspirations every 5 mL.  Performed by: Personally   Additional Notes: Patient tolerated the procedure well without complications   Procedure Name: Intubation Date/Time: 04/23/2015 2:27 PM Performed by: Orest DikesPETERS, Mili Piltz J Pre-anesthesia Checklist: Patient identified, Emergency Drugs available, Suction available and Patient being monitored Patient Re-evaluated:Patient Re-evaluated prior to inductionOxygen Delivery Method: Circle System Utilized Preoxygenation: Pre-oxygenation with 100% oxygen Intubation Type: IV induction Ventilation: Mask ventilation without difficulty Laryngoscope Size: Mac and 4 Grade View: Grade I Tube type: Oral Tube size: 7.0 mm Number of attempts: 1 Airway Equipment and Method: Stylet and Oral airway Placement Confirmation: ETT inserted through vocal cords under direct vision,  positive ETCO2 and breath sounds checked- equal and bilateral Secured at: 21 cm Tube secured with: Tape Dental Injury: Teeth and Oropharynx as per pre-operative assessment

## 2015-04-24 ENCOUNTER — Encounter (HOSPITAL_COMMUNITY): Payer: Self-pay | Admitting: Orthopedic Surgery

## 2015-04-24 NOTE — Progress Notes (Signed)
Physical Therapy Treatment Patient Details Name: Taylor Gay MRN: [id number] DOB: 03/11/1946 Today's Date: 04/24/2015    History of Present Illness patient is a 69 yo female s/p R TKA.    PT Comments    Pt continues to have R knee buckling during ambulation despite wearing KI. Per PA note, if pt able to complete 2 PT sessions without issues pt could D/C home today, but pt does not feel comfortable going home today due to R knee buckling/lack of quad control. Pt very anxious about knee buckling and wishes to increase her safety before D/C home. Pt would benefit from continued PT to increase functional independence and safety. Continue with POC.   Follow Up Recommendations  Home health PT;Supervision/Assistance - 24 hour     Equipment Recommendations   (patient has had equipment delivered)    Recommendations for Other Services       Precautions / Restrictions Precautions Precautions: Knee Required Braces or Orthoses: Knee Immobilizer - Right Knee Immobilizer - Right: On when out of bed or walking Restrictions Weight Bearing Restrictions: Yes RLE Weight Bearing: Weight bearing as tolerated    Mobility  Bed Mobility Overal bed mobility: Modified Independent             General bed mobility comments: increased time, no physical assist to perform  Transfers Overall transfer level: Needs assistance Equipment used: Rolling walker (2 wheeled) Transfers: Sit to/from Stand Sit to Stand: Min guard         General transfer comment: Min guard for safety. Cues for hand placement.  Ambulation/Gait Ambulation/Gait assistance: Min guard Ambulation Distance (Feet): 120 Feet Assistive device: Rolling walker (2 wheeled) Gait Pattern/deviations: Step-to pattern;Decreased stance time - right;Antalgic Gait velocity: decreased Gait velocity interpretation: Below normal speed for age/gender General Gait Details: R knee buckling noted x2 despite KI. Pt required several standing rest  breaks.   Stairs            Wheelchair Mobility    Modified Rankin (Stroke Patients Only)       Balance Overall balance assessment: Needs assistance Sitting-balance support: Feet supported Sitting balance-Leahy Scale: Good     Standing balance support: No upper extremity supported;During functional activity Standing balance-Leahy Scale: Fair                      Cognition Arousal/Alertness: Awake/alert Behavior During Therapy: Anxious Overall Cognitive Status: Within Functional Limits for tasks assessed                      Exercises Total Joint Exercises Ankle Circles/Pumps: AROM;Both;10 reps Quad Sets: AROM;Right;10 reps Heel Slides: AAROM;Right;5 reps Hip ABduction/ADduction: AAROM;Right;5 reps Straight Leg Raises: AAROM;Right;5 reps    General Comments        Pertinent Vitals/Pain Pain Assessment: 0-10 Pain Score: 5  Pain Location: R knee Pain Descriptors / Indicators: Sore Pain Intervention(s): Monitored during session    Home Living Family/patient expects to be discharged to:: Private residence Living Arrangements: Children Available Help at Discharge: Family;Friend(s) Type of Home: Apartment Home Access: Level entry;Other (comment)   Home Layout: One level        Prior Function Level of Independence: Independent          PT Goals (current goals can now be found in the care plan section) Acute Rehab PT Goals Patient Stated Goal: to not be in pain PT Goal Formulation: With patient Time For Goal Achievement: 05/08/15 Potential to Achieve Goals: Good Progress towards  PT goals: Progressing toward goals    Frequency  BID    PT Plan Current plan remains appropriate    Co-evaluation             End of Session Equipment Utilized During Treatment: Gait belt;Right knee immobilizer Activity Tolerance: Patient limited by fatigue Patient left: in bed;with call bell/phone within reach     Time: 1502-1530 PT Time  Calculation (min) (ACUTE ONLY): 28 min  Charges:  $Gait Training: 8-22 mins $Therapeutic Exercise: 8-22 mins                    G Codes:      Leonard SchwartzRumley, Laurence Folz, SPTA 04/24/2015, 4:22 PM

## 2015-04-24 NOTE — Progress Notes (Signed)
Orthopedic Tech Progress Note Patient Details:  Taylor Gay 11/27/1946 [id number] Patient off cpm at 5:35 pm Patient ID: Taylor Gay, female   DOB: 05/20/1946, 69 y.o.   MRN: [id number]   [name], [name] 04/24/2015, 8:19 PM

## 2015-04-24 NOTE — Care Management Note (Signed)
Case Management Note  Patient Details  Name: Taylor Gay MRN: [id number] Date of Birth: 02/05/1946  Subjective/Objective:                    Action/Plan:   Expected Discharge Date:  04/24/15               Expected Discharge Plan:  Home w Home Health Services  In-House Referral:  Clinical Social Work  Discharge planning Services  CM Consult  Post Acute Care Choice:  Home Health Choice offered to:  Patient  DME Arranged:    DME Agency:     HH Arranged:  PT, OT HH Agency:  [facility]  Status of Service:  Completed, signed off  Medicare Important Message Given:  N/A - LOS <3 / Initial given by admissions Date Medicare IM Given:    Medicare IM give by:    Date Additional Medicare IM Given:    Additional Medicare Important Message give by:     If discussed at Long Length of Stay Meetings, dates discussed:    Additional Comments:  Kingsley PlanWile, [name] Marie, RN 04/24/2015, 12:39 PM

## 2015-04-24 NOTE — Evaluation (Signed)
Physical Therapy Evaluation Patient Details Name: Lovena NeighboursLinda B Valiente MRN: [id number] DOB: 04/21/1946 Today's Date: 04/24/2015   History of Present Illness  patient is a 69 yo female s/p R TKA.  Clinical Impression  Patient demonstrates deficits in functional mobility as indicated below. Will need continued skilled PT to address deficits and maximize function. Will see as indicated and progress as tolerated. OF NOTE: Patient demonstrates no quad control RLE at this time. RLE buckling despite use of KI.     Follow Up Recommendations Home health PT;Supervision/Assistance - 24 hour    Equipment Recommendations   (patient has had equipment delivered)    Recommendations for Other Services       Precautions / Restrictions Precautions Precautions: Knee Required Braces or Orthoses: Knee Immobilizer - Right Knee Immobilizer - Right: On when out of bed or walking Restrictions Weight Bearing Restrictions: Yes RLE Weight Bearing: Weight bearing as tolerated      Mobility  Bed Mobility Overal bed mobility: Modified Independent             General bed mobility comments: increased time, no physical assist to perform  Transfers Overall transfer level: Needs assistance Equipment used: Rolling walker (2 wheeled) Transfers: Sit to/from Stand Sit to Stand: Min assist         General transfer comment: VCs for hand placement and positioning  Ambulation/Gait Ambulation/Gait assistance: Min guard Ambulation Distance (Feet): 110 Feet Assistive device: Rolling walker (2 wheeled) Gait Pattern/deviations: Step-to pattern;Decreased stride length;Antalgic Gait velocity: decreased Gait velocity interpretation: <1.8 ft/sec, indicative of risk for recurrent falls General Gait Details: several instances of noted RLE buckling despite KI.  (VCS for sequencing and safety)  Stairs            Wheelchair Mobility    Modified Rankin (Stroke Patients Only)       Balance Overall balance  assessment: Needs assistance Sitting-balance support: Feet supported Sitting balance-Leahy Scale: Good     Standing balance support: No upper extremity supported;During functional activity Standing balance-Leahy Scale: Fair                               Pertinent Vitals/Pain Pain Assessment: No/denies pain (endorses pain with some movements and ROM)    Home Living Family/patient expects to be discharged to:: Private residence Living Arrangements: Children Available Help at Discharge: Family;Friend(s) Type of Home: Apartment Home Access: Level entry;Other (comment)     Home Layout: One level        Prior Function Level of Independence: Independent               Hand Dominance   Dominant Hand: Right    Extremity/Trunk Assessment   Upper Extremity Assessment: Overall WFL for tasks assessed           Lower Extremity Assessment: RLE deficits/detail RLE Deficits / Details: limited ROM active 10-60 degrees (RLE quad lag and sensory deficits))       Communication   Communication: No difficulties  Cognition Arousal/Alertness: Awake/alert Behavior During Therapy: WFL for tasks assessed/performed Overall Cognitive Status: Within Functional Limits for tasks assessed                      General Comments      Exercises        Assessment/Plan    PT Assessment Patient needs continued PT services  PT Diagnosis Difficulty walking;Acute pain   PT Problem List Decreased strength;Decreased range  of motion;Decreased activity tolerance;Decreased balance;Decreased mobility;Decreased coordination;Pain  PT Treatment Interventions DME instruction;Gait training;Functional mobility training;Therapeutic activities;Therapeutic exercise;Balance training;Patient/family education   PT Goals (Current goals can be found in the Care Plan section) Acute Rehab PT Goals Patient Stated Goal: to not be in pain PT Goal Formulation: With patient Time For Goal  Achievement: 05/08/15 Potential to Achieve Goals: Good    Frequency BID   Barriers to discharge        Co-evaluation               End of Session Equipment Utilized During Treatment: Gait belt;Right knee immobilizer Activity Tolerance: Patient tolerated treatment well Patient left: in chair;with call bell/phone within reach Nurse Communication: Mobility status         Time: [id number] PT Time Calculation (min) (ACUTE ONLY): 24 min   Charges:   PT Evaluation $Initial PT Evaluation Tier I: 1 Procedure PT Treatments $Gait Training: 8-22 mins   PT G CodesFabio Asa:        Alin Chavira J 04/24/2015, 2:21 PM Charlotte Crumbevon Mckenlee Mangham, PT DPT  [phone]

## 2015-04-24 NOTE — Progress Notes (Signed)
     Subjective:  POD#1 R TKA. Patient reports pain as mild to moderate.  Resting comfortably in bed.    Objective:   VITALS:   Filed Vitals:   04/23/15 1909 04/23/15 2126 04/24/15 0213 04/24/15 0456  BP: 129/64 109/53 107/60 134/61  Pulse: 56 66 66 67  Temp: 97.5 F (36.4 C) 97.8 F (36.6 C) 97.9 F (36.6 C) 97.9 F (36.6 C)  TempSrc: Oral Oral Oral Oral  Resp: 16 16 16 16   Height: 5\' 8"  (1.727 m)     Weight: 98.884 kg (218 lb)     SpO2: 99% 99% 98% 100%    Neurologically intact ABD soft Neurovascular intact Sensation intact distally Intact pulses distally Dorsiflexion/Plantar flexion intact Incision: dressing C/D/I R knee in immobilizer  Lab Results  Component Value Date   WBC 8.1 04/11/2015   HGB 13.9 04/11/2015   HCT 40.5 04/11/2015   MCV 84.0 04/11/2015   PLT 258 04/11/2015   BMET    Component Value Date/Time   NA 139 04/11/2015 1205   K 3.2* 04/11/2015 1205   CL 102 04/11/2015 1205   CO2 24 04/11/2015 1205   GLUCOSE 141* 04/11/2015 1205   BUN 26* 04/11/2015 1205   CREATININE 1.16* 04/11/2015 1205   CREATININE 0.89 05/24/2012 2007   CALCIUM 9.2 04/11/2015 1205   GFRNONAA 48* 04/11/2015 1205   GFRAA 55* 04/11/2015 1205     Assessment/Plan: 1 Day Post-Op   Active Problems:   Arthritis of knee   Up with therapy WBAT in the RLE OK to d/c knee immobilizer once block has worn off ASA 325mg  PO daily for DVT prophylaxis Plan to D/C home today if the patient is able to work with PT 2X today without issues.    Hibba Schram Hilda LiasMarie 04/24/2015, 8:13 AM Cell [phone]

## 2015-04-24 NOTE — Anesthesia Postprocedure Evaluation (Signed)
  Anesthesia Post-op Note  Patient: Taylor Gay  Procedure(s) Performed: Procedure(s): TOTAL KNEE ARTHROPLASTY (Right)  Patient Location: PACU  Anesthesia Type:GA combined with regional for post-op pain  Level of Consciousness: awake, alert , oriented and patient cooperative  Airway and Oxygen Therapy: Patient Spontanous Breathing and Patient connected to nasal cannula oxygen  Post-op Pain: none  Post-op Assessment: Post-op Vital signs reviewed, Patient's Cardiovascular Status Stable, Respiratory Function Stable, Patent Airway, No signs of Nausea or vomiting and Pain level controlled  Post-op Vital Signs: Reviewed and stable  Last Vitals:  Filed Vitals:   04/24/15 1100  BP: 138/66  Pulse: 77  Temp: 36.8 C  Resp: 16    Complications: No apparent anesthesia complications

## 2015-04-24 NOTE — Care Management Note (Signed)
Case Management Note  UR completed   Patient Details  Name: Taylor NeighboursLinda B Gay MRN: [id number] Date of Birth: 07/16/1946  Subjective/Objective:                    Action/Plan:   Expected Discharge Date:  04/26/15               Expected Discharge Plan:  Home w Home Health Services  In-House Referral:  Clinical Social Work  Discharge planning Services  CM Consult  Post Acute Care Choice:    Choice offered to:     DME Arranged:    DME Agency:     HH Arranged:    HH Agency:     Status of Service:  In process, will continue to follow  Medicare Important Message Given:    Date Medicare IM Given:    Medicare IM give by:    Date Additional Medicare IM Given:    Additional Medicare Important Message give by:     If discussed at Long Length of Stay Meetings, dates discussed:    Additional Comments:  Kingsley PlanWile, Lil Lepage Marie, RN 04/24/2015, 7:41 AM

## 2015-04-24 NOTE — Evaluation (Signed)
Occupational Therapy Evaluation Patient Details Name: Lovena NeighboursLinda B Sambrano MRN: [id number] DOB: 08/29/1946 Today's Date: 04/24/2015    History of Present Illness patient is a 69 yo female s/p R TKA.   Clinical Impression   PTA pt lived at home and was independent with ADLs. Pt currently requires min guard for mobility due to RLE weakness and pain and requires assist for LB ADLs. Pt will benefit from acute OT to progress to Supervision level to return home with family support.     Follow Up Recommendations  No OT follow up;Supervision - Intermittent    Equipment Recommendations  None recommended by OT    Recommendations for Other Services       Precautions / Restrictions Precautions Precautions: Knee Required Braces or Orthoses: Knee Immobilizer - Right Knee Immobilizer - Right: On when out of bed or walking Restrictions Weight Bearing Restrictions: Yes RLE Weight Bearing: Weight bearing as tolerated      Mobility Bed Mobility Overal bed mobility: Modified Independent             General bed mobility comments: increased time. HOB flat, no use of bed rail  Transfers Overall transfer level: Needs assistance Equipment used: Rolling walker (2 wheeled) Transfers: Sit to/from Stand Sit to Stand: Min guard         General transfer comment: Min guard for safety. Cues for hand placement.         ADL Overall ADL's : Needs assistance/impaired Eating/Feeding: Independent;Sitting   Grooming: Min guard;Standing   Upper Body Bathing: Set up;Sitting   Lower Body Bathing: Sit to/from stand;Minimal assistance   Upper Body Dressing : Set up;Sitting   Lower Body Dressing: Moderate assistance;Sit to/from stand   Toilet Transfer: Min guard;Ambulation;RW (BSC over toilet)   Toileting- Clothing Manipulation and Hygiene: Min guard;Sit to/from stand       Functional mobility during ADLs: Min guard;Rolling walker       Vision Additional Comments: No change from  baseline          Pertinent Vitals/Pain Pain Assessment: 0-10 Pain Score: 5  Pain Location: R knee Pain Descriptors / Indicators: Sore Pain Intervention(s): Monitored during session;Repositioned     Hand Dominance Right   Extremity/Trunk Assessment Upper Extremity Assessment Upper Extremity Assessment: Overall WFL for tasks assessed   Lower Extremity Assessment Lower Extremity Assessment: Defer to PT evaluation RLE Deficits / Details: limited ROM active 10-60 degrees (RLE quad lag and sensory deficits)) RLE: Unable to fully assess due to pain RLE Sensation: decreased proprioception RLE Coordination: decreased fine motor;decreased gross motor   Cervical / Trunk Assessment Cervical / Trunk Assessment: Normal   Communication Communication Communication: No difficulties   Cognition Arousal/Alertness: Awake/alert Behavior During Therapy: Anxious Overall Cognitive Status: Within Functional Limits for tasks assessed                                Home Living Family/patient expects to be discharged to:: Private residence Living Arrangements: Children Available Help at Discharge: Family;Friend(s) Type of Home: Apartment Home Access: Level entry;Other (comment)     Home Layout: One level     Bathroom Shower/Tub: Tub/shower unit Shower/tub characteristics: Engineer, building servicesCurtain Bathroom Toilet: Standard     Home Equipment: Shower seat;Bedside commode;Walker - 2 wheels;Cane - single point          Prior Functioning/Environment Level of Independence: Independent             OT Diagnosis: Generalized weakness;Acute  pain   OT Problem List: Decreased strength;Decreased range of motion;Decreased activity tolerance;Impaired balance (sitting and/or standing);Decreased knowledge of precautions;Pain   OT Treatment/Interventions: Self-care/ADL training;Therapeutic exercise;Energy conservation;DME and/or AE instruction;Therapeutic activities;Patient/family  education;Balance training    OT Goals(Current goals can be found in the care plan section) Acute Rehab OT Goals Patient Stated Goal: to go home tomorrow if ready OT Goal Formulation: With patient Time For Goal Achievement: 05/01/15 Potential to Achieve Goals: Good ADL Goals Pt Will Perform Lower Body Bathing: with set-up;with supervision;with adaptive equipment;sit to/from stand Pt Will Perform Lower Body Dressing: with set-up;with supervision;with adaptive equipment;sit to/from stand Pt Will Transfer to Toilet: with supervision;ambulating;bedside commode Pt Will Perform Toileting - Clothing Manipulation and hygiene: with supervision;sit to/from stand  OT Frequency: Min 2X/week    End of Session Equipment Utilized During Treatment: Rolling walker;Right knee immobilizer CPM Right Knee CPM Right Knee: On Right Knee Flexion (Degrees): 80 Right Knee Extension (Degrees): 0 Additional Comments: pt began at 90* and requested to lower to 80*  Activity Tolerance: Patient tolerated treatment well Patient left: in bed;in CPM;with call bell/phone within reach   Time: [id number] OT Time Calculation (min): 29 min Charges:  OT General Charges $OT Visit: 1 Procedure OT Evaluation $Initial OT Evaluation Tier I: 1 Procedure OT Treatments $Self Care/Home Management : 8-22 mins G-Codes:    Nena JordanMiller, Anahlia Iseminger M 04/24/2015, 4:59 PM   Carney LivingLeeAnn Marie Vincenza Dail, OTR/L Occupational Therapist [phone] (pager)
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
## 2015-04-24 NOTE — Care Management Note (Signed)
Case Management Note  Patient Details  Name: Lovena NeighboursLinda B Antwine MRN: [id number] Date of Birth: 11/24/1946   Patient already has CPM machine delivered to home from [employer] , patient also has walker , commode and cane at home.   Subjective/Objective:                    Action/Plan:   Expected Discharge Date:  04/24/15               Expected Discharge Plan:  Home w Home Health Services  In-House Referral:  Clinical Social Work  Discharge planning Services  CM Consult  Post Acute Care Choice:  Home Health Choice offered to:  Patient  DME Arranged:    DME Agency:     HH Arranged:    HH Agency:  [facility]  Status of Service:  In process, will continue to follow  Medicare Important Message Given:  N/A - LOS <3 / Initial given by admissions Date Medicare IM Given:    Medicare IM give by:    Date Additional Medicare IM Given:    Additional Medicare Important Message give by:     If discussed at Long Length of Stay Meetings, dates discussed:    Additional Comments:  Kingsley PlanWile, Cory Rama Marie, RN 04/24/2015, 10:46 AM

## 2015-04-25 NOTE — Progress Notes (Signed)
Physical Therapy Treatment Patient Details Name: Taylor NeighboursLinda B Gay MRN: [id number] DOB: 06/15/1946 Today's Date: 04/25/2015    History of Present Illness patient is a 69 yo female s/p R TKA.    PT Comments    Pt unable to progress ambulation and limited participation with HEP this AM due to pain. Pt would benefit from continued PT to increase functional independence and safety. Continue to recommend HHPT for ongoing PT.   Follow Up Recommendations  Home health PT;Supervision/Assistance - 24 hour     Equipment Recommendations       Recommendations for Other Services       Precautions / Restrictions Precautions Precautions: Knee Required Braces or Orthoses: Knee Immobilizer - Right Knee Immobilizer - Right: On when out of bed or walking Restrictions RLE Weight Bearing: Weight bearing as tolerated    Mobility  Bed Mobility Overal bed mobility: Modified Independent                Transfers Overall transfer level: Needs assistance Equipment used: Rolling walker (2 wheeled) Transfers: Sit to/from Stand Sit to Stand: Min guard         General transfer comment: Min guard for safety.  Ambulation/Gait Ambulation/Gait assistance: Min guard Ambulation Distance (Feet): 120 Feet Assistive device: Rolling walker (2 wheeled) Gait Pattern/deviations: Step-to pattern;Decreased stance time - right;Antalgic   Gait velocity interpretation: Below normal speed for age/gender General Gait Details: No R knee buckling noted. Continuing to ambulate with KI. Pt c/o pain during ambulation. Required several standing rest breaks.   Stairs            Wheelchair Mobility    Modified Rankin (Stroke Patients Only)       Balance                                    Cognition Arousal/Alertness: Awake/alert Behavior During Therapy: Anxious Overall Cognitive Status: Within Functional Limits for tasks assessed                      Exercises Total Joint  Exercises Ankle Circles/Pumps: AROM;Both;10 reps Quad Sets: AROM;Right;5 reps Straight Leg Raises: AAROM;Right;5 reps    General Comments        Pertinent Vitals/Pain Pain Assessment: 0-10 Pain Score: 10-Worst pain ever Pain Location: R knee when standing and during ambulation. Pain Descriptors / Indicators: Sore;Burning Pain Intervention(s): Monitored during session;Repositioned;Patient requesting pain meds-RN notified    Home Living                      Prior Function            PT Goals (current goals can now be found in the care plan section) Progress towards PT goals: Progressing toward goals    Frequency  BID    PT Plan Current plan remains appropriate    Co-evaluation             End of Session Equipment Utilized During Treatment: Right knee immobilizer Activity Tolerance: Patient limited by pain;Patient limited by fatigue Patient left: in chair;with call bell/phone within reach     Time: 0751-0828 PT Time Calculation (min) (ACUTE ONLY): 37 min  Charges:                       G CodesLeonard Schwartz:      Geniyah Eischeid, SPTA 04/25/2015, 8:37 AM

## 2015-04-25 NOTE — Progress Notes (Signed)
Patient ID: Taylor Gay, female   DOB: 11/17/1946, 69 y.o.   MRN: [id number]     Subjective:  Patient reports pain as mild.  Patient denies any CP or SOB  Objective:   VITALS:   Filed Vitals:   04/24/15 1407 04/24/15 1748 04/24/15 2211 04/25/15 0515  BP: 105/73 119/63 120/54 124/65  Pulse: 63 62 58 52  Temp: 98.2 F (36.8 C) 98.5 F (36.9 C) 97.7 F (36.5 C) 97.7 F (36.5 C)  TempSrc: Oral Oral Oral Oral  Resp: 16 16 17 17   Height:      Weight:      SpO2: 100% 95% 97% 98%    ABD soft Sensation intact distally Dorsiflexion/Plantar flexion intact Incision: dressing C/D/I and no drainage   Lab Results  Component Value Date   WBC 8.1 04/11/2015   HGB 13.9 04/11/2015   HCT 40.5 04/11/2015   MCV 84.0 04/11/2015   PLT 258 04/11/2015     Assessment/Plan: 2 Days Post-Op   Active Problems:   Arthritis of knee   Advance diet Up with therapy Discharge home with home health  WBAT Dry dressing PRN Dc per Taylor Gay and Dr Lindell Sparimothy Gay   Taylor Gay Taylor Gay 04/25/2015, 1:09 PM   Margarita Ranaimothy Murphy MD [phone]

## 2015-04-25 NOTE — Progress Notes (Signed)
Patient discharged home with daughter via wheelchair. Discharge instructions discussed medications and exercises gone over. All questions answered. Cindee SaltMcBride,Yareni Creps K, RN

## 2015-04-30 NOTE — Progress Notes (Signed)
She suffered from primary OA of the knee

## 2015-05-07 ENCOUNTER — Telehealth: Payer: Self-pay

## 2015-05-07 NOTE — Telephone Encounter (Signed)
Pt states that she just had knee surgery that is why she has not had her mammogram yet. Pt states that she does plan on getting mammogram when her knee starts feeling better.

## 2015-06-26 ENCOUNTER — Ambulatory Visit (INDEPENDENT_AMBULATORY_CARE_PROVIDER_SITE_OTHER): Payer: Medicare Other | Admitting: Family Medicine

## 2015-06-26 ENCOUNTER — Encounter: Payer: Self-pay | Admitting: Family Medicine

## 2015-06-26 ENCOUNTER — Other Ambulatory Visit: Payer: Self-pay

## 2015-06-26 ENCOUNTER — Telehealth: Payer: Self-pay

## 2015-06-26 VITALS — BP 130/80 | HR 76 | Temp 97.6°F | Wt 194.0 lb

## 2015-06-26 DIAGNOSIS — R300 Dysuria: Secondary | ICD-10-CM

## 2015-06-26 DIAGNOSIS — R634 Abnormal weight loss: Secondary | ICD-10-CM | POA: Diagnosis not present

## 2015-06-26 DIAGNOSIS — R739 Hyperglycemia, unspecified: Secondary | ICD-10-CM

## 2015-06-26 DIAGNOSIS — R1013 Epigastric pain: Secondary | ICD-10-CM

## 2015-06-26 LAB — POCT URINALYSIS DIPSTICK
Glucose, UA: NEGATIVE
Nitrite, UA: POSITIVE
PH UA: 6
Spec Grav, UA: 1.025
Urobilinogen, UA: 1

## 2015-06-26 LAB — HEMOGLOBIN A1C: HEMOGLOBIN A1C: 6.1 % (ref 4.6–6.5)

## 2015-06-26 LAB — COMPREHENSIVE METABOLIC PANEL
ALT: 13 U/L (ref 0–35)
AST: 11 U/L (ref 0–37)
Albumin: 3.7 g/dL (ref 3.5–5.2)
Alkaline Phosphatase: 92 U/L (ref 39–117)
BILIRUBIN TOTAL: 0.4 mg/dL (ref 0.2–1.2)
BUN: 25 mg/dL — ABNORMAL HIGH (ref 6–23)
CO2: 27 mEq/L (ref 19–32)
CREATININE: 1.19 mg/dL (ref 0.40–1.20)
Calcium: 9.1 mg/dL (ref 8.4–10.5)
Chloride: 97 mEq/L (ref 96–112)
GFR: 48.04 mL/min — AB (ref >60.00)
Glucose, Bld: 85 mg/dL (ref 70–99)
Potassium: 2.7 mEq/L — CL (ref 3.5–5.1)
Sodium: 136 mEq/L (ref 135–145)
Total Protein: 7.3 g/dL (ref 6.0–8.3)

## 2015-06-26 LAB — LIPASE: Lipase: 27 U/L (ref 11.0–59.0)

## 2015-06-26 LAB — SEDIMENTATION RATE: SED RATE: 80 mm/h — AB (ref 0–22)

## 2015-06-26 MED ORDER — POTASSIUM CHLORIDE CRYS ER 20 MEQ PO TBCR: EXTENDED_RELEASE_TABLET | ORAL | Status: DC

## 2015-06-26 MED ORDER — CEPHALEXIN 500 MG PO CAPS: 500.0000 mg | ORAL_CAPSULE | Freq: Three times a day (TID) | ORAL | Status: DC

## 2015-06-26 NOTE — Telephone Encounter (Signed)
Critical Lab: K is 2.7

## 2015-06-26 NOTE — Telephone Encounter (Signed)
K-dur 20 meq- take two tablets today and two tablets tomorrow and then one daily until follow up.  Repeat BMP in 2 days.

## 2015-06-26 NOTE — Patient Instructions (Signed)

## 2015-06-26 NOTE — Telephone Encounter (Signed)
Pt informed. Rx sent to pharmacy  
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
## 2015-06-26 NOTE — Progress Notes (Signed)
Pre visit review using our clinic review tool, if applicable. No additional management support is needed unless otherwise documented below in the visit note. 

## 2015-06-27 LAB — CBC WITH DIFFERENTIAL/PLATELET
BASOS PCT: 2.1 % (ref 0.0–3.0)
Basophils Absolute: 0.2 10*3/uL — ABNORMAL HIGH (ref 0.0–0.1)
EOS PCT: 0.4 % (ref 0.0–5.0)
Eosinophils Absolute: 0 10*3/uL (ref 0.0–0.7)
HCT: 40.1 % (ref 36.0–46.0)
HEMOGLOBIN: 13.4 g/dL (ref 12.0–15.0)
Lymphocytes Relative: 16.8 % (ref 12.0–46.0)
Lymphs Abs: 1.8 10*3/uL (ref 0.7–4.0)
MCHC: 33.5 g/dL (ref 30.0–36.0)
MCV: 85.3 fl (ref 78.0–100.0)
MONOS PCT: 4.9 % (ref 3.0–12.0)
Monocytes Absolute: 0.5 10*3/uL (ref 0.1–1.0)
NEUTROS ABS: 8.3 10*3/uL — AB (ref 1.4–7.7)
Neutrophils Relative %: 75.8 % (ref 43.0–77.0)
Platelets: 318 10*3/uL (ref 150.0–400.0)
RBC: 4.71 Mil/uL (ref 3.87–5.11)
RDW: 14.6 % (ref 11.5–15.5)
WBC: 10.9 10*3/uL — AB (ref 4.0–10.5)

## 2015-06-28 ENCOUNTER — Other Ambulatory Visit (INDEPENDENT_AMBULATORY_CARE_PROVIDER_SITE_OTHER): Payer: Medicare Other

## 2015-06-28 ENCOUNTER — Ambulatory Visit (INDEPENDENT_AMBULATORY_CARE_PROVIDER_SITE_OTHER)
Admission: RE | Admit: 2015-06-28 | Discharge: 2015-06-28 | Disposition: A | Discharge status: Home / Self Care | Payer: Medicare Other | Source: Ambulatory Visit | Attending: Family Medicine | Admitting: Family Medicine

## 2015-06-28 ENCOUNTER — Other Ambulatory Visit: Payer: Self-pay | Admitting: Family Medicine

## 2015-06-28 DIAGNOSIS — E875 Hyperkalemia: Secondary | ICD-10-CM

## 2015-06-28 DIAGNOSIS — R634 Abnormal weight loss: Secondary | ICD-10-CM | POA: Diagnosis not present

## 2015-06-28 DIAGNOSIS — E876 Hypokalemia: Secondary | ICD-10-CM

## 2015-06-28 DIAGNOSIS — R1013 Epigastric pain: Secondary | ICD-10-CM

## 2015-06-28 LAB — BASIC METABOLIC PANEL
BUN: 20 mg/dL (ref 6–23)
CHLORIDE: 99 meq/L (ref 96–112)
CO2: 24 meq/L (ref 19–32)
CREATININE: 1.1 mg/dL (ref 0.40–1.20)
Calcium: 9.4 mg/dL (ref 8.4–10.5)
GFR: 52.6 mL/min — ABNORMAL LOW (ref >60.00)
GLUCOSE: 127 mg/dL — AB (ref 70–99)
Potassium: 3 mEq/L — ABNORMAL LOW (ref 3.5–5.1)
Sodium: 137 mEq/L (ref 135–145)

## 2015-06-28 MED ORDER — IOHEXOL 300 MG/ML  SOLN
100.0000 mL | Freq: Once | INTRAMUSCULAR | Status: AC | PRN
  Administered 2015-06-28: 100 mL via INTRAVENOUS

## 2015-06-29 LAB — URINE CULTURE: Colony Count: >=100000

## 2015-07-01 ENCOUNTER — Telehealth: Payer: Self-pay | Admitting: Family Medicine

## 2015-07-01 NOTE — Telephone Encounter (Signed)
Pt would like a call back about urine cultural  and  CT scan

## 2015-07-02 NOTE — Telephone Encounter (Signed)
Patient stated she spoke with Montrice and discussed this yesterday.

## 2015-07-05 ENCOUNTER — Other Ambulatory Visit (INDEPENDENT_AMBULATORY_CARE_PROVIDER_SITE_OTHER): Payer: Medicare Other

## 2015-07-05 DIAGNOSIS — E876 Hypokalemia: Secondary | ICD-10-CM | POA: Diagnosis not present

## 2015-07-05 LAB — BASIC METABOLIC PANEL
BUN: 18 mg/dL (ref 6–23)
CHLORIDE: 104 meq/L (ref 96–112)
CO2: 25 mEq/L (ref 19–32)
CREATININE: 0.98 mg/dL (ref 0.40–1.20)
Calcium: 9.2 mg/dL (ref 8.4–10.5)
GFR: 60.1 mL/min (ref >60.00)
GLUCOSE: 118 mg/dL — AB (ref 70–99)
POTASSIUM: 3.1 meq/L — AB (ref 3.5–5.1)
SODIUM: 138 meq/L (ref 135–145)

## 2015-07-07 ENCOUNTER — Inpatient Hospital Stay (HOSPITAL_COMMUNITY)
Admission: EM | Admit: 2015-07-07 | Discharge: 2015-07-10 | DRG: 690 | Disposition: A | Discharge status: Home / Self Care | Payer: Medicare Other | Attending: Internal Medicine | Admitting: Internal Medicine

## 2015-07-07 ENCOUNTER — Encounter (HOSPITAL_COMMUNITY): Payer: Self-pay | Admitting: Emergency Medicine

## 2015-07-07 DIAGNOSIS — I10 Essential (primary) hypertension: Secondary | ICD-10-CM | POA: Diagnosis present

## 2015-07-07 DIAGNOSIS — E876 Hypokalemia: Secondary | ICD-10-CM | POA: Diagnosis present

## 2015-07-07 DIAGNOSIS — K580 Irritable bowel syndrome with diarrhea: Secondary | ICD-10-CM | POA: Diagnosis present

## 2015-07-07 DIAGNOSIS — N3000 Acute cystitis without hematuria: Secondary | ICD-10-CM | POA: Insufficient documentation

## 2015-07-07 DIAGNOSIS — E860 Dehydration: Secondary | ICD-10-CM | POA: Insufficient documentation

## 2015-07-07 DIAGNOSIS — B9620 Unspecified Escherichia coli [E. coli] as the cause of diseases classified elsewhere: Secondary | ICD-10-CM | POA: Diagnosis present

## 2015-07-07 DIAGNOSIS — R0602 Shortness of breath: Secondary | ICD-10-CM

## 2015-07-07 DIAGNOSIS — N390 Urinary tract infection, site not specified: Secondary | ICD-10-CM | POA: Diagnosis not present

## 2015-07-07 DIAGNOSIS — Z6829 Body mass index (BMI) 29.0-29.9, adult: Secondary | ICD-10-CM

## 2015-07-07 DIAGNOSIS — R197 Diarrhea, unspecified: Secondary | ICD-10-CM

## 2015-07-07 DIAGNOSIS — E669 Obesity, unspecified: Secondary | ICD-10-CM | POA: Diagnosis present

## 2015-07-07 NOTE — ED Notes (Signed)
Patient here with complaint of e.coli urinary tract infection, diarrhea, and vomiting. States this problem has been ongoing for while now; stating symptoms as far back as mid-June. Was seen by PCP and prescribed Keflex on 06/26/2015. Took course of abx with minimal improvement. Presents tonight because vomiting has worsened and diarrhea is becoming worse. Also reports "just feeling bad"; reports shakiness, weakness, and dizziness at times. Tachycardic and Tachypneic in triage.
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
## 2015-07-08 ENCOUNTER — Encounter (HOSPITAL_COMMUNITY): Payer: Self-pay | Admitting: *Deleted

## 2015-07-08 ENCOUNTER — Ambulatory Visit: Payer: Medicare Other | Admitting: Family Medicine

## 2015-07-08 ENCOUNTER — Emergency Department (HOSPITAL_COMMUNITY): Payer: Medicare Other

## 2015-07-08 ENCOUNTER — Other Ambulatory Visit: Payer: Self-pay

## 2015-07-08 DIAGNOSIS — Z6829 Body mass index (BMI) 29.0-29.9, adult: Secondary | ICD-10-CM | POA: Diagnosis not present

## 2015-07-08 DIAGNOSIS — N390 Urinary tract infection, site not specified: Principal | ICD-10-CM

## 2015-07-08 DIAGNOSIS — I10 Essential (primary) hypertension: Secondary | ICD-10-CM | POA: Diagnosis present

## 2015-07-08 DIAGNOSIS — K580 Irritable bowel syndrome with diarrhea: Secondary | ICD-10-CM | POA: Diagnosis present

## 2015-07-08 DIAGNOSIS — R197 Diarrhea, unspecified: Secondary | ICD-10-CM | POA: Diagnosis not present

## 2015-07-08 DIAGNOSIS — N3000 Acute cystitis without hematuria: Secondary | ICD-10-CM | POA: Diagnosis not present

## 2015-07-08 DIAGNOSIS — E669 Obesity, unspecified: Secondary | ICD-10-CM | POA: Diagnosis present

## 2015-07-08 DIAGNOSIS — E860 Dehydration: Secondary | ICD-10-CM | POA: Diagnosis present

## 2015-07-08 DIAGNOSIS — E876 Hypokalemia: Secondary | ICD-10-CM | POA: Diagnosis present

## 2015-07-08 DIAGNOSIS — R112 Nausea with vomiting, unspecified: Secondary | ICD-10-CM | POA: Diagnosis not present

## 2015-07-08 DIAGNOSIS — B9620 Unspecified Escherichia coli [E. coli] as the cause of diseases classified elsewhere: Secondary | ICD-10-CM | POA: Diagnosis present

## 2015-07-08 LAB — CBC
HEMATOCRIT: 39.8 % (ref 36.0–46.0)
Hemoglobin: 13.6 g/dL (ref 12.0–15.0)
MCH: 28.5 pg (ref 26.0–34.0)
MCHC: 34.2 g/dL (ref 30.0–36.0)
MCV: 83.3 fL (ref 78.0–100.0)
Platelets: 290 10*3/uL (ref 150–400)
RBC: 4.78 MIL/uL (ref 3.87–5.11)
RDW: 14.7 % (ref 11.5–15.5)
WBC: 18.1 10*3/uL — ABNORMAL HIGH (ref 4.0–10.5)

## 2015-07-08 LAB — COMPREHENSIVE METABOLIC PANEL
ALBUMIN: 3.7 g/dL (ref 3.5–5.0)
ALT: 24 U/L (ref 14–54)
AST: 34 U/L (ref 15–41)
Alkaline Phosphatase: 97 U/L (ref 38–126)
Anion gap: 17 — ABNORMAL HIGH (ref 5–15)
BUN: 19 mg/dL (ref 6–20)
CHLORIDE: 101 mmol/L (ref 101–111)
CO2: 19 mmol/L — ABNORMAL LOW (ref 22–32)
CREATININE: 1.18 mg/dL — AB (ref 0.44–1.00)
Calcium: 9.3 mg/dL (ref 8.9–10.3)
GFR calc Af Amer: 54 mL/min — ABNORMAL LOW (ref >60)
GFR calc non Af Amer: 47 mL/min — ABNORMAL LOW (ref >60)
Glucose, Bld: 190 mg/dL — ABNORMAL HIGH (ref 65–99)
Potassium: 3.1 mmol/L — ABNORMAL LOW (ref 3.5–5.1)
Sodium: 137 mmol/L (ref 135–145)
Total Bilirubin: 0.5 mg/dL (ref 0.3–1.2)
Total Protein: 6.8 g/dL (ref 6.5–8.1)

## 2015-07-08 LAB — TROPONIN I: Troponin I: <0.03 ng/mL (ref <0.031)

## 2015-07-08 LAB — BASIC METABOLIC PANEL
Anion gap: 12 (ref 5–15)
BUN: 16 mg/dL (ref 6–20)
CALCIUM: 8.4 mg/dL — AB (ref 8.9–10.3)
CO2: 22 mmol/L (ref 22–32)
Chloride: 102 mmol/L (ref 101–111)
Creatinine, Ser: 1.05 mg/dL — ABNORMAL HIGH (ref 0.44–1.00)
GFR calc Af Amer: >60 mL/min (ref >60)
GFR, EST NON AFRICAN AMERICAN: 54 mL/min — AB (ref >60)
Glucose, Bld: 173 mg/dL — ABNORMAL HIGH (ref 65–99)
Potassium: 2.8 mmol/L — ABNORMAL LOW (ref 3.5–5.1)
Sodium: 136 mmol/L (ref 135–145)

## 2015-07-08 LAB — URINALYSIS, ROUTINE W REFLEX MICROSCOPIC
BILIRUBIN URINE: NEGATIVE
Glucose, UA: NEGATIVE mg/dL
KETONES UR: NEGATIVE mg/dL
Nitrite: POSITIVE — AB
PROTEIN: NEGATIVE mg/dL
Specific Gravity, Urine: 1.016 (ref 1.005–1.030)
Urobilinogen, UA: 0.2 mg/dL (ref 0.0–1.0)
pH: 5.5 (ref 5.0–8.0)

## 2015-07-08 LAB — URINE MICROSCOPIC-ADD ON

## 2015-07-08 LAB — I-STAT CG4 LACTIC ACID, ED: Lactic Acid, Venous: 2.75 mmol/L (ref 0.5–2.0)

## 2015-07-08 LAB — LACTIC ACID, PLASMA: Lactic Acid, Venous: 1.6 mmol/L (ref 0.5–2.0)

## 2015-07-08 LAB — TSH: TSH: 1.957 u[IU]/mL (ref 0.350–4.500)

## 2015-07-08 LAB — MAGNESIUM: Magnesium: 1.7 mg/dL (ref 1.7–2.4)

## 2015-07-08 MED ORDER — OXYCODONE HCL 5 MG PO TABS
5.0000 mg | ORAL_TABLET | Freq: Four times a day (QID) | ORAL | Status: DC | PRN
  Administered 2015-07-09: 5 mg via ORAL
  Filled 2015-07-08: qty 1

## 2015-07-08 MED ORDER — DEXTROSE 5 % IV SOLN
1.0000 g | Freq: Once | INTRAVENOUS | Status: AC
  Administered 2015-07-08: 1 g via INTRAVENOUS
  Filled 2015-07-08: qty 10

## 2015-07-08 MED ORDER — ACETAMINOPHEN 325 MG PO TABS: 650.0000 mg | ORAL_TABLET | Freq: Four times a day (QID) | ORAL | Status: DC | PRN

## 2015-07-08 MED ORDER — LORATADINE 10 MG PO TABS
10.0000 mg | ORAL_TABLET | Freq: Every day | ORAL | Status: DC | PRN
Start: 2015-07-08 — End: 2015-07-10
  Filled 2015-07-08: qty 1

## 2015-07-08 MED ORDER — SODIUM CHLORIDE 0.9 % IV BOLUS (SEPSIS)
1000.0000 mL | Freq: Once | INTRAVENOUS | Status: AC
  Administered 2015-07-08: 1000 mL via INTRAVENOUS

## 2015-07-08 MED ORDER — PHENOL 1.4 % MT LIQD
1.0000 | OROMUCOSAL | Status: DC | PRN
  Administered 2015-07-08: 1 via OROMUCOSAL
  Filled 2015-07-08: qty 177

## 2015-07-08 MED ORDER — DEXTROSE 5 % IV SOLN: 1.0000 g | Freq: Once | INTRAVENOUS | Status: DC

## 2015-07-08 MED ORDER — BACLOFEN 10 MG PO TABS
10.0000 mg | ORAL_TABLET | Freq: Three times a day (TID) | ORAL | Status: DC
  Administered 2015-07-08 – 2015-07-10 (×7): 10 mg via ORAL
  Filled 2015-07-08 (×9): qty 1

## 2015-07-08 MED ORDER — ALBUTEROL SULFATE (2.5 MG/3ML) 0.083% IN NEBU: 3.0000 mL | INHALATION_SOLUTION | Freq: Four times a day (QID) | RESPIRATORY_TRACT | Status: DC | PRN

## 2015-07-08 MED ORDER — OXYCODONE HCL 5 MG PO TABS
5.0000 mg | ORAL_TABLET | Freq: Four times a day (QID) | ORAL | Status: DC | PRN
  Administered 2015-07-08: 5 mg via ORAL
  Filled 2015-07-08: qty 1

## 2015-07-08 MED ORDER — BOOST / RESOURCE BREEZE PO LIQD
1.0000 | Freq: Every day | ORAL | Status: DC
  Administered 2015-07-08: 1 via ORAL

## 2015-07-08 MED ORDER — POTASSIUM CHLORIDE CRYS ER 20 MEQ PO TBCR: 20.0000 meq | EXTENDED_RELEASE_TABLET | Freq: Two times a day (BID) | ORAL | Status: DC

## 2015-07-08 MED ORDER — DEXTROSE 5 % IV SOLN
1.0000 g | INTRAVENOUS | Status: DC
  Administered 2015-07-08 – 2015-07-09 (×2): 1 g via INTRAVENOUS
  Filled 2015-07-08 (×3): qty 10

## 2015-07-08 MED ORDER — ALBUTEROL SULFATE HFA 108 (90 BASE) MCG/ACT IN AERS: 2.0000 | INHALATION_SPRAY | Freq: Four times a day (QID) | RESPIRATORY_TRACT | Status: DC | PRN

## 2015-07-08 MED ORDER — POTASSIUM CHLORIDE CRYS ER 20 MEQ PO TBCR
40.0000 meq | EXTENDED_RELEASE_TABLET | ORAL | Status: AC
  Administered 2015-07-08 (×2): 40 meq via ORAL
  Filled 2015-07-08 (×2): qty 2

## 2015-07-08 MED ORDER — HEPARIN SODIUM (PORCINE) 5000 UNIT/ML IJ SOLN
5000.0000 [IU] | Freq: Three times a day (TID) | INTRAMUSCULAR | Status: DC
  Administered 2015-07-08 – 2015-07-10 (×7): 5000 [IU] via SUBCUTANEOUS
  Filled 2015-07-08 (×11): qty 1

## 2015-07-08 NOTE — Progress Notes (Signed)
Initial Nutrition Assessment  DOCUMENTATION CODES:   Obesity unspecified  INTERVENTION:   Boost Breeze po daily, each supplement provides 250 kcal and 9 grams of protein  NUTRITION DIAGNOSIS:   Inadequate oral intake related to  (decreased appetite) as evidenced by per patient/family report  GOAL:   Patient will meet greater than or equal to 90% of their needs  MONITOR:   PO intake, Supplement acceptance, Labs, Weight trends, I & O's  REASON FOR ASSESSMENT:   Malnutrition Screening Tool  ASSESSMENT:  69 y.o. Female who presents with c/o dysuria. She had a UTI diagnosed on July 6th. Was put on 5 day course of keflex. Symptoms improved, but 2 days later her urine turned cloudy again and her dysuria returned. She presents to the ED for recurrence of symptoms.  Patient reports her appetite has been decreased.  She states some days she cannot eat much as the smell of food makes her nauseous.  PO intake 50-100% per flowsheet records.  Also endorses a 25 lb weight loss in the past 9 weeks (10%)  -- severe from time frame.  [facility] supper.  RD to order.  Nutrition focused physical exam completed.  No muscle or subcutaneous fat depletion noticed.  Diet Order:  Diet regular Room service appropriate?: Yes; Fluid consistency:: Thin  Skin:  Reviewed, no issues  Last BM:  7/17  Height:   Ht Readings from Last 1 Encounters:  07/08/15 5' 7.75" (1.721 m)    Weight:   Wt Readings from Last 1 Encounters:  07/08/15 195 lb 1.7 oz (88.5 kg)    Ideal Body Weight:  61.3 kg  Wt Readings from Last 10 Encounters:  07/08/15 195 lb 1.7 oz (88.5 kg)  06/26/15 194 lb (87.998 kg)  04/23/15 218 lb (98.884 kg)  04/11/15 215 lb 9.6 oz (97.796 kg)  04/05/15 218 lb (98.884 kg)  12/17/14 210 lb (95.255 kg)  04/02/14 219 lb (99.338 kg)  12/15/13 216 lb (97.977 kg)  08/04/13 207 lb (93.895 kg)  06/30/13 207 lb (93.895 kg)    BMI:  Body mass index is 29.88  kg/(m^2).  Estimated Nutritional Needs:   Kcal:  1800-2000  Protein:  90-100 gm  Fluid:  1.8-2.0 L  EDUCATION NEEDS:   No education needs identified at this time  Maureen ChattersKatie Luvern Mischke, RD, LDN Pager #: [phone] After-Hours Pager #: [phone]

## 2015-07-08 NOTE — ED Notes (Signed)
Pt reports chills and cloudy urine since the end of June. States she has been on abx given to her by pcp for "e coli" in her urine. Pt reports a few days ago after finishing meds, her urine was cloudy and she began having chills again.
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
## 2015-07-08 NOTE — Progress Notes (Signed)
UR Completed. Susana Gripp, RN, BSN.  [phone] 

## 2015-07-08 NOTE — Progress Notes (Signed)
PROGRESS NOTE    Taylor NeighboursLinda B Gay ZOX:[id number] DOB: 01/03/1946 DOA: 07/07/2015 PCP: Kristian CoveyBURCHETTE,BRUCE W, MD  HPI/Brief narrative 69 year old female patient with history of HTN, anemia, anxiety disorder, IBS, right TKA on 04/23/15, has been having intermittent nausea since knee surgery, 2 weeks history of cloudy urine, chills, treated by PCP with Keflex for UTI. Post Keflex, urine colored improved but returned to become cloudy within couple of days with associated dysuria. 2 weeks history of almost daily 1-2 episodes of nonbloody emesis, intermittent diarrhea and approximately 25 pound weight loss in the last 5-6 weeks. Vague chest discomfort-seem GI in origin. Admitted for presumed UTI.   Assessment/Plan:  Partially treated UTI - Urine culture 06/26/15 confirmed pansensitive Escherichia coli and patient was appropriately treated with Keflex despite which she has persisting symptoms. - Admitted and started empirically on IV Rocephin-continue - Follow blood culture and repeat urine culture results. - Lactate has normalized.  Profound hypokalemia - Secondary to poor oral intake, GI losses and Maxide. - Replace aggressively and follow BMP in a.m. - Magnesium: 1.7.  Nausea, vomiting & diarrhea - Going on intermittently for a couple of weeks. No vomiting or BM since yesterday. - ? Related to UTI versus IBS. - If has recurrence or worsening of symptoms, consider further workup i.e. C. difficile PCR.  Essential hypertension - Soft blood pressures. Continue IV fluids and hold antihypertensives for now.  Status post right TKA 04/23/15  Prolonged QTC - EKG on admission showed sinus tachycardia in the 130s, normal axis and T-wave inversion in inferior lateral leads. QTC 492 ms. - Monitor on telemetry. Replace potassium and follow EKG in a.m. -Avoid QTC prolonging medications     DVT prophylaxis: Subcutaneous heparin Code Status: Full Family Communication: None at bedside Disposition Plan: DC  home when medically stable   Consultants:  None  Procedures:  None  Antibiotics:  IV Rocephin 7/17 >   Subjective: Feels better. No nausea, vomiting or diarrhea since 7/17. Feels generally weak.  Objective: Filed Vitals:   07/08/15 0309 07/08/15 0310 07/08/15 0613 07/08/15 1325  BP: 115/60  102/58 111/56  Pulse: 95  79 72  Temp: 98.6 F (37 C)  98 F (36.7 C) 98.5 F (36.9 C)  TempSrc: Oral  Oral Oral  Resp: 18  18 18   Height:  5' 7.75" (1.721 m)    Weight: 88.5 kg (195 lb 1.7 oz) 88.5 kg (195 lb 1.7 oz)    SpO2: 97%  97% 98%    Intake/Output Summary (Last 24 hours) at 07/08/15 1346 Last data filed at 07/08/15 1230  Gross per 24 hour  Intake    720 ml  Output    650 ml  Net     70 ml   Filed Weights   07/07/15 2343 07/08/15 0309 07/08/15 0310  Weight: 88.451 kg (195 lb) 88.5 kg (195 lb 1.7 oz) 88.5 kg (195 lb 1.7 oz)     Exam:  General exam: Pleasant middle-aged female lying comfortably in bed. Respiratory system: Clear. No increased work of breathing. Cardiovascular system: S1 & S2 heard, RRR. No JVD, murmurs, gallops, clicks or pedal edema. Telemetry: Sinus rhythm Gastrointestinal system: Abdomen is nondistended, soft and nontender. Normal bowel sounds heard. Central nervous system: Alert and oriented. No focal neurological deficits. Extremities: Symmetric 5 x 5 power.   Data Reviewed: Basic Metabolic Panel:  Recent Labs Lab 07/05/15 0923 07/07/15 2358 07/08/15 0759  NA 138 137 136  K 3.1* 3.1* 2.8*  CL 104 101 102  CO2 25 19* 22  GLUCOSE 118* 190* 173*  BUN CREATININE 0.98 1.18* 1.05*  CALCIUM 9.2 9.3 8.4*  MG  --   --  1.7   Liver Function Tests:  Recent Labs Lab 07/07/15 2358  AST 34  ALT 24  ALKPHOS 97  BILITOT 0.5  PROT 6.8  ALBUMIN 3.7   No results for input(s): LIPASE, AMYLASE in the last 168 hours. No results for input(s): AMMONIA in the last 168 hours. CBC:  Recent Labs Lab 07/07/15 2358  WBC 18.1*  HGB  13.6  HCT 39.8  MCV 83.3  PLT 290   Cardiac Enzymes:  Recent Labs Lab 07/07/15 2356  TROPONINI <0.03   BNP (last 3 results) No results for input(s): PROBNP in the last 8760 hours. CBG: No results for input(s): GLUCAP in the last 168 hours.  No results found for this or any previous visit (from the past 240 hour(s)).       Studies: Dg Chest Port 1 View  07/08/2015   CLINICAL DATA:  Shortness of breath and chills. History of hypertension. E coli 1 month ago. History of hypertension.  EXAM: PORTABLE CHEST - 1 VIEW  COMPARISON:  Chest radiograph January 08, 2013  FINDINGS: Cardiomediastinal silhouette is unremarkable. Similar blunting of the RIGHT cardiophrenic angle likely represents prominent epicardial fat pad. The lungs are clear without pleural effusions or focal consolidations. Trachea projects midline and there is no pneumothorax. Soft tissue planes and included osseous structures are non-suspicious.  IMPRESSION: No acute cardiopulmonary process ; stable appearance of the chest from January 08, 2013.   Electronically Signed   By: Awilda Metro M.D.   On: 07/08/2015 00:34        Scheduled Meds: . baclofen  10 mg Oral TID  . [START ON 07/09/2015] cefTRIAXone (ROCEPHIN)  IV  1 g Intravenous Once  . heparin  5,000 Units Subcutaneous 3 times per day  . potassium chloride  40 mEq Oral Q4H   Continuous Infusions:   Active Problems:   UTI (lower urinary tract infection)    Time spent: 35 minutes.    Marcellus Scott, MD, FACP, FHM. [facility] Pager [phone]  If 7PM-7AM, please contact night-coverage [email or web address] Password [email or web address] 07/08/2015, 1:46 PM    LOS: 0 days

## 2015-07-09 DIAGNOSIS — E876 Hypokalemia: Secondary | ICD-10-CM

## 2015-07-09 DIAGNOSIS — R197 Diarrhea, unspecified: Secondary | ICD-10-CM

## 2015-07-09 DIAGNOSIS — R112 Nausea with vomiting, unspecified: Secondary | ICD-10-CM

## 2015-07-09 LAB — BASIC METABOLIC PANEL
Anion gap: 9 (ref 5–15)
BUN: 12 mg/dL (ref 6–20)
CALCIUM: 8.2 mg/dL — AB (ref 8.9–10.3)
CO2: 25 mmol/L (ref 22–32)
Chloride: 105 mmol/L (ref 101–111)
Creatinine, Ser: 0.95 mg/dL (ref 0.44–1.00)
GFR calc Af Amer: >60 mL/min (ref >60)
GFR calc non Af Amer: >60 mL/min (ref >60)
Glucose, Bld: 114 mg/dL — ABNORMAL HIGH (ref 65–99)
POTASSIUM: 2.9 mmol/L — AB (ref 3.5–5.1)
Sodium: 139 mmol/L (ref 135–145)

## 2015-07-09 LAB — CBC
HCT: 33 % — ABNORMAL LOW (ref 36.0–46.0)
Hemoglobin: 11.1 g/dL — ABNORMAL LOW (ref 12.0–15.0)
MCH: 28.8 pg (ref 26.0–34.0)
MCHC: 33.6 g/dL (ref 30.0–36.0)
MCV: 85.5 fL (ref 78.0–100.0)
Platelets: 272 10*3/uL (ref 150–400)
RBC: 3.86 MIL/uL — ABNORMAL LOW (ref 3.87–5.11)
RDW: 15.2 % (ref 11.5–15.5)
WBC: 6.8 10*3/uL (ref 4.0–10.5)

## 2015-07-09 LAB — MAGNESIUM: MAGNESIUM: 1.8 mg/dL (ref 1.7–2.4)

## 2015-07-09 MED ORDER — MENTHOL 3 MG MT LOZG: 1.0000 | LOZENGE | OROMUCOSAL | Status: DC | PRN

## 2015-07-09 MED ORDER — PHENOL 1.4 % MT LIQD: 1.0000 | OROMUCOSAL | Status: DC | PRN

## 2015-07-09 MED ORDER — POTASSIUM CHLORIDE CRYS ER 20 MEQ PO TBCR
40.0000 meq | EXTENDED_RELEASE_TABLET | ORAL | Status: AC
  Administered 2015-07-09 (×3): 40 meq via ORAL
  Filled 2015-07-09 (×3): qty 2

## 2015-07-09 NOTE — Progress Notes (Addendum)
PT Cancellation Note and Discharge  Patient Details Name: Taylor Gay MRN: [id number] DOB: 12/11/1946   Cancelled Treatment:    Reason Eval/Treat Not Completed: PT screened, no needs identified, will sign off. Spoke with pt who politely states "I don't need any therapy". Pt continues to explain that she completed PT after her TKA and has been independent at home. She has had recent issues with a bursae in that operative knee but reports that she continues to be independent with occasionally using a cane for long outings in the community. Pt states multiple times throughout encounter that she doesn't wish to participate with therapy when she doesn't feel she needs it. PT will sign off at this time, however if needs change, please reconsult.    Conni SlipperKirkman, Ellizabeth Dacruz 07/09/2015, 1:12 PM  Conni SlipperLaura Judith Campillo, PT, DPT Acute Rehabilitation Services Pager: [phone]

## 2015-07-09 NOTE — Progress Notes (Signed)
PROGRESS NOTE    Taylor Gay UJW:[id number] DOB: 06-22-1946 DOA: 07/07/2015 PCP: Kristian Covey, MD  HPI/Brief narrative 69 year old female patient with history of HTN, anemia, anxiety disorder, IBS, right TKA on 04/23/15, has been having intermittent nausea since knee surgery, 2 weeks history of cloudy urine, chills, treated by PCP with Keflex for UTI. Post Keflex, urine colored improved but returned to become cloudy within couple of days with associated dysuria. 2 weeks history of almost daily 1-2 episodes of nonbloody emesis, intermittent diarrhea and approximately 25 pound weight loss in the last 5-6 weeks. Admitted for presumed UTI. Denied having chest pain during course of this hospitalization.   Assessment/Plan:  Partially treated UTI - Urine culture 06/26/15 confirmed pansensitive Escherichia coli and patient was appropriately treated with Keflex despite which she has persisting symptoms. - Admitted and started empirically on IV Rocephin-continue - Follow blood culture and repeat urine culture results: Pending. - Lactate has normalized.  Profound hypokalemia - Secondary to poor oral intake, GI losses and Maxide. - Replace aggressively and follow BMP in a.m. - Magnesium: 1.8.  Nausea, vomiting & diarrhea - Going on intermittently for a couple of weeks. No vomiting or BM since yesterday. - ? Related to UTI versus IBS. - If has recurrence or worsening of symptoms, consider further workup i.e. C. difficile PCR. - Improved. Soft BM 1 since admission. No further nausea or vomiting. Tolerating diet.  Essential hypertension - Controlled off of antihypertensives for now.  Status post right TKA 04/23/15 - Completed 3 weeks of aspirin 325 MG daily DVT prophylaxis postop  Prolonged QTC - EKG on admission showed sinus tachycardia in the 130s, normal axis and T-wave inversion in inferior lateral leads. QTC 492 ms. - Monitor on telemetry. Replace potassium and follow EKG in  a.m. -Avoid QTC prolonging medications  - QTC on 7/19:426 - Resolved.  Anemia - ? Dilutional. Follow CBC in a.m.    DVT prophylaxis: Subcutaneous heparin Code Status: Full Family Communication: Discussed in the presence of her pastor at bedside after patient's consent. Disposition Plan: DC home when medically stable   Consultants:  None  Procedures:  None  Antibiotics:  IV Rocephin 7/17 >   Subjective: Continues to feel better. Still weak. No nausea. Tolerating diet. Soft BM 1 since admission.  Objective: Filed Vitals:   07/08/15 0613 07/08/15 1325 07/08/15 2022 07/09/15 0410  BP: 102/58 111/56 118/58 113/54  Pulse: 79 72 72 62  Temp: 98 F (36.7 C) 98.5 F (36.9 C) 98.1 F (36.7 C) 97.9 F (36.6 C)  TempSrc: Oral Oral Oral Oral  Resp: Height:      Weight:      SpO2: 97% 98% 97% 95%    Intake/Output Summary (Last 24 hours) at 07/09/15 1216 Last data filed at 07/09/15 0423  Gross per 24 hour  Intake   1175 ml  Output   1950 ml  Net   -775 ml   Filed Weights   07/07/15 2343 07/08/15 0309 07/08/15 0310  Weight: 88.451 kg (195 lb) 88.5 kg (195 lb 1.7 oz) 88.5 kg (195 lb 1.7 oz)     Exam:  General exam: Pleasant middle-aged female lying comfortably in bed. Respiratory system: Clear. No increased work of breathing. Cardiovascular system: S1 & S2 heard, RRR. No JVD, murmurs, gallops, clicks or pedal edema. Telemetry: Sinus rhythm- SB >55. Gastrointestinal system: Abdomen is nondistended, soft and nontender. Normal bowel sounds heard. Central nervous system: Alert and oriented. No focal neurological  deficits. Extremities: Symmetric 5 x 5 power.   Data Reviewed: Basic Metabolic Panel:  Recent Labs Lab 07/05/15 0923 07/07/15 2358 07/08/15 0759 07/09/15 0330  NA 138 137 136 139  K 3.1* 3.1* 2.8* 2.9*  CL 104 101 102 105  CO2 25 19* 22 25  GLUCOSE 118* 190* 173* 114*  BUN 18 19 16 12   CREATININE 0.98 1.18* 1.05* 0.95  CALCIUM  9.2 9.3 8.4* 8.2*  MG  --   --  1.7 1.8   Liver Function Tests:  Recent Labs Lab 07/07/15 2358  AST 34  ALT 24  ALKPHOS 97  BILITOT 0.5  PROT 6.8  ALBUMIN 3.7   No results for input(s): LIPASE, AMYLASE in the last 168 hours. No results for input(s): AMMONIA in the last 168 hours. CBC:  Recent Labs Lab 07/07/15 2358 07/09/15 0330  WBC 18.1* 6.8  HGB 13.6 11.1*  HCT 39.8 33.0*  MCV 83.3 85.5  PLT 290 272   Cardiac Enzymes:  Recent Labs Lab 07/07/15 2356  TROPONINI <0.03   BNP (last 3 results) No results for input(s): PROBNP in the last 8760 hours. CBG: No results for input(s): GLUCAP in the last 168 hours.  No results found for this or any previous visit (from the past 240 hour(s)).       Studies: Dg Chest Port 1 View  07/08/2015   CLINICAL DATA:  Shortness of breath and chills. History of hypertension. E coli 1 month ago. History of hypertension.  EXAM: PORTABLE CHEST - 1 VIEW  COMPARISON:  Chest radiograph January 08, 2013  FINDINGS: Cardiomediastinal silhouette is unremarkable. Similar blunting of the RIGHT cardiophrenic angle likely represents prominent epicardial fat pad. The lungs are clear without pleural effusions or focal consolidations. Trachea projects midline and there is no pneumothorax. Soft tissue planes and included osseous structures are non-suspicious.  IMPRESSION: No acute cardiopulmonary process ; stable appearance of the chest from January 08, 2013.   Electronically Signed   By: Awilda Metroourtnay  Bloomer M.D.   On: 07/08/2015 00:34        Scheduled Meds: . baclofen  10 mg Oral TID  . cefTRIAXone (ROCEPHIN)  IV  1 g Intravenous Q24H  . feeding supplement  1 Container Oral Q supper  . heparin  5,000 Units Subcutaneous 3 times per day  . potassium chloride  40 mEq Oral Q4H   Continuous Infusions:   Active Problems:   UTI (lower urinary tract infection)   Acute cystitis without hematuria    Time spent: 25 minutes.    Marcellus ScottHONGALGI,Elijio Staples,  MD, FACP, FHM. [facility] Pager [phone]  If 7PM-7AM, please contact night-coverage [email or web address] Password [email or web address] 07/09/2015, 12:16 PM    LOS: 1 day

## 2015-07-10 DIAGNOSIS — N3000 Acute cystitis without hematuria: Secondary | ICD-10-CM

## 2015-07-10 DIAGNOSIS — E860 Dehydration: Secondary | ICD-10-CM

## 2015-07-10 LAB — BASIC METABOLIC PANEL
ANION GAP: 10 (ref 5–15)
BUN: 15 mg/dL (ref 6–20)
CO2: 24 mmol/L (ref 22–32)
Calcium: 8.5 mg/dL — ABNORMAL LOW (ref 8.9–10.3)
Chloride: 107 mmol/L (ref 101–111)
Creatinine, Ser: 1.02 mg/dL — ABNORMAL HIGH (ref 0.44–1.00)
GFR, EST NON AFRICAN AMERICAN: 56 mL/min — AB (ref >60)
Glucose, Bld: 122 mg/dL — ABNORMAL HIGH (ref 65–99)
Potassium: 4.5 mmol/L (ref 3.5–5.1)
Sodium: 141 mmol/L (ref 135–145)

## 2015-07-10 LAB — CBC
HEMATOCRIT: 34.1 % — AB (ref 36.0–46.0)
Hemoglobin: 11.2 g/dL — ABNORMAL LOW (ref 12.0–15.0)
MCH: 28.6 pg (ref 26.0–34.0)
MCHC: 32.8 g/dL (ref 30.0–36.0)
MCV: 87.2 fL (ref 78.0–100.0)
Platelets: 233 10*3/uL (ref 150–400)
RBC: 3.91 MIL/uL (ref 3.87–5.11)
RDW: 15.2 % (ref 11.5–15.5)
WBC: 6.4 10*3/uL (ref 4.0–10.5)

## 2015-07-10 LAB — URINE CULTURE

## 2015-07-10 MED ORDER — CEPHALEXIN 500 MG PO CAPS
500.0000 mg | ORAL_CAPSULE | Freq: Three times a day (TID) | ORAL | Status: DC
  Administered 2015-07-10: 500 mg via ORAL
  Filled 2015-07-10 (×3): qty 1

## 2015-07-10 MED ORDER — CEPHALEXIN 500 MG PO CAPS: 500.0000 mg | ORAL_CAPSULE | Freq: Three times a day (TID) | ORAL | Status: DC

## 2015-07-10 NOTE — Discharge Summary (Signed)
Physician Discharge Summary  Taylor Gay ZOX:[id number] DOB: 12/09/1946 DOA: 07/07/2015  PCP: Kristian Covey, MD  Admit date: 07/07/2015 Discharge date: 07/10/2015  Recommendations for Outpatient Follow-up:  1. Pt will need to follow up with PCP in 1 week post discharge 2. Please obtain BMP in 1 week  Discharge Diagnoses:  UTI - Urine culture 06/26/15 confirmed pansensitive Escherichia coli and patient was appropriately treated with Keflex despite which she has persisting symptoms. - Admitted and started empirically on IV Rocephin-continue - Follow blood culture--negative  -urine culture shows Escherichia coli -Patient was switched to cephalexin 500 mg every 8 hours. The patient will be given 11 additional days to complete 14 days of therapy. - Lactate has normalized.  Profound hypokalemia - Secondary to poor oral intake, GI losses and Maxide. - Replace aggressively Potassium 4.500 discharge- - Magnesium: 1.8.  Nausea, vomiting & diarrhea - Going on intermittently for a couple of weeks. No vomiting or BM since yesterday. - ? Related to UTI versus IBS. - the patient's diet was advanced. The patient was eating 100% of her food without any emesis. - Improved. Soft BM 1 since admission. No further nausea or vomiting. Tolerating diet.  Essential hypertension - Controlled off of antihypertensives for now. -Systolic blood pressures 110-120 off of antihypertensives throughout the hospitalization- -will not restart Maxzide at this time. The patient was encouraged to follow up with her primary care provider for blood pressure check  -Remain off potassium supplementation   Status post right TKA 04/23/15 - Completed 3 weeks of aspirin 325 MG daily DVT prophylaxis postop  Prolonged QTC - EKG on admission showed sinus tachycardia in the 130s, normal axis and T-wave inversion in inferior lateral leads. QTC 492 ms. - Monitor on telemetry. Replace potassium and follow EKG in a.m. -Avoid  QTC prolonging medications  - QTC on 7/19:426 - Resolved.  Anemia - Dilutional. Follow CBC --hemoglobin remained stable thereafter   Discharge Condition: stable  Disposition: home  Diet:heart healthy Wt Readings from Last 3 Encounters:  07/08/15 88.5 kg (195 lb 1.7 oz)  06/26/15 87.998 kg (194 lb)  04/23/15 98.884 kg (218 lb)    History of present illness:  69 year old female patient with history of HTN, anemia, anxiety disorder, IBS, right TKA on 04/23/15, has been having intermittent nausea since knee surgery, 2 weeks history of cloudy urine, chills, treated by PCP with Keflex for UTI. Post Keflex, urine colored improved but returned to become cloudy within couple of days with associated dysuria. 2 weeks history of almost daily 1-2 episodes of nonbloody emesis, intermittent diarrhea and approximately 25 pound weight loss in the last 5-6 weeks. Admitted for presumed UTI. Denied having chest pain during course of this hospitalization. Urinalysis revealed TNTC WBC and culture showed E.coli.  Patient was initially started on ceftriaxone. This was consistent for to cephalexin at time of discharge. The patient will be treated with 11 more days to complete 14 days therapy.    Discharge Exam: Filed Vitals:   07/10/15 0428  BP: 126/63  Pulse: 61  Temp: 97.5 F (36.4 C)  Resp: 18   Filed Vitals:   07/09/15 0410 07/09/15 1300 07/09/15 2041 07/10/15 0428  BP: 113/54 102/51 123/52 126/63  Pulse: 62 67 69 61  Temp: 97.9 F (36.6 C) 98.1 F (36.7 C) 98.3 F (36.8 C) 97.5 F (36.4 C)  TempSrc: Oral Oral Oral Oral  Resp: Height:      Weight:      SpO2: 95%  99% 100%   General: A&O x 3, NAD, pleasant, cooperative Cardiovascular: RRR, no rub, no gallop, no S3 Respiratory: CTAB, no wheeze, no rhonchi Abdomen:soft, nontender, nondistended, positive bowel sounds Extremities: No edema, No lymphangitis, no petechiae  Discharge Instructions      Discharge Instructions      Diet - low sodium heart healthy    Complete by:  As directed      Increase activity slowly    Complete by:  As directed             Medication List    STOP taking these medications        potassium chloride SA 20 MEQ tablet  Commonly known as:  K-DUR,KLOR-CON     triamterene-hydrochlorothiazide 75-50 MG per tablet  Commonly known as:  MAXZIDE      TAKE these medications        baclofen 10 MG tablet  Commonly known as:  LIORESAL  Take 1 tablet (10 mg total) by mouth 3 (three) times daily.     cephALEXin 500 MG capsule  Commonly known as:  KEFLEX  Take 1 capsule (500 mg total) by mouth every 8 (eight) hours.     loratadine 10 MG tablet  Commonly known as:  CLARITIN  Take 10 mg by mouth daily as needed for allergies.     oxyCODONE 5 MG immediate release tablet  Commonly known as:  Oxy IR/ROXICODONE  Take 5 mg by mouth daily.     VENTOLIN HFA 108 (90 BASE) MCG/ACT inhaler  Generic drug:  albuterol  USE 2 PUFFS EVERY 6 HOURS AS NEEDED FOR WHEEZING.         The results of significant diagnostics from this hospitalization (including imaging, microbiology, ancillary and laboratory) are listed below for reference.    Significant Diagnostic Studies: Ct Abdomen Pelvis W Contrast  06/28/2015   CLINICAL DATA:  Weight loss, abdominal pain, anorexia, fever, nausea and vomiting  EXAM: CT ABDOMEN AND PELVIS WITH CONTRAST  TECHNIQUE: Multidetector CT imaging of the abdomen and pelvis was performed using the standard protocol following bolus administration of intravenous contrast.  CONTRAST:  100mL OMNIPAQUE IOHEXOL 300 MG/ML  SOLN  COMPARISON:  No similar prior exam is available at this institution for comparison or on [facility].  FINDINGS: Lower chest: Thin curvilinear bilateral lower lobe scarring or atelectasis.  Hepatobiliary: Cyst at dome of the left hepatic lobe, 1.5 cm. The liver is otherwise unremarkable. Gallbladder appears normal.  Pancreas: Partial fatty infiltration of  the uncinate process incidentally noted. No pancreatic ductal dilatation or mass. No surrounding stranding.  Spleen: Normal  Adrenals/Urinary Tract: Adrenal glands appear normal. Mild bilateral renal cortical scarring is noted. No hydroureteronephrosis.  Stomach/Bowel: No bowel wall thickening or focal segmental dilatation is identified. Appendix not identified but no secondary evidence for acute appendicitis is seen. Stomach appears normal.  Vascular/Lymphatic: Minimal atheromatous aortic calcification without aneurysm. No lymphadenopathy.  Other: There is minimal soft tissue fullness inferior to the base of the bladder, image 96, which could represent volume averaging with normal soft tissue structures although soft tissue mass or Bartholin's cyst could appear similar. No free air or fluid. Uterus and ovaries presumed surgically absent, not visualized.  Musculoskeletal: Lumbar spine disc degenerative change. No acute osseous abnormality.  IMPRESSION: No acute intra-abdominal or pelvic pathology.  Minimal subjective fullness of soft tissues inferior to the bladder base. This could represent normal volume averaging, but raises the question of possible soft tissue mass or Bartholin's cyst. Correlate  clinically with physical inspection. If no correlate is seen at physical exam, consider ultrasound to evaluate for a possible eccentric bladder mass.   Electronically Signed   By: Christiana Pellant M.D.   On: 06/28/2015 15:05   Dg Chest Port 1 View  07/08/2015   CLINICAL DATA:  Shortness of breath and chills. History of hypertension. E coli 1 month ago. History of hypertension.  EXAM: PORTABLE CHEST - 1 VIEW  COMPARISON:  Chest radiograph January 08, 2013  FINDINGS: Cardiomediastinal silhouette is unremarkable. Similar blunting of the RIGHT cardiophrenic angle likely represents prominent epicardial fat pad. The lungs are clear without pleural effusions or focal consolidations. Trachea projects midline and there is no  pneumothorax. Soft tissue planes and included osseous structures are non-suspicious.  IMPRESSION: No acute cardiopulmonary process ; stable appearance of the chest from January 08, 2013.   Electronically Signed   By: Awilda Metro M.D.   On: 07/08/2015 00:34     Microbiology: Recent Results (from the past 240 hour(s))  Culture, blood (routine x 2)     Status: None (Preliminary result)   Collection Time: 07/08/15 12:23 AM  Result Value Ref Range Status   Specimen Description BLOOD RIGHT WRIST  Final   Special Requests BOTTLES DRAWN AEROBIC AND ANAEROBIC 10CC EACH  Final   Culture NO GROWTH 2 DAYS  Final   Report Status PENDING  Incomplete  Culture, blood (routine x 2)     Status: None (Preliminary result)   Collection Time: 07/08/15 12:25 AM  Result Value Ref Range Status   Specimen Description BLOOD LEFT HAND  Final   Special Requests BOTTLES DRAWN AEROBIC ONLY 2CC  Final   Culture NO GROWTH 2 DAYS  Final   Report Status PENDING  Incomplete  Urine culture     Status: None   Collection Time: 07/08/15  1:09 AM  Result Value Ref Range Status   Specimen Description URINE, CLEAN CATCH  Final   Special Requests NONE  Final   Culture >=100,000 COLONIES/mL ESCHERICHIA COLI  Final   Report Status 07/10/2015 FINAL  Final   Organism ID, Bacteria ESCHERICHIA COLI  Final      Susceptibility   Escherichia coli - MIC*    AMPICILLIN 16 INTERMEDIATE Intermediate     CEFAZOLIN <=4 SENSITIVE Sensitive     CEFTRIAXONE <=1 SENSITIVE Sensitive     CIPROFLOXACIN <=0.25 SENSITIVE Sensitive     GENTAMICIN <=1 SENSITIVE Sensitive     IMIPENEM <=0.25 SENSITIVE Sensitive     NITROFURANTOIN <=16 SENSITIVE Sensitive     TRIMETH/SULFA <=20 SENSITIVE Sensitive     AMPICILLIN/SULBACTAM 4 INTERMEDIATE Intermediate     PIP/TAZO <=4 SENSITIVE Sensitive     * >=100,000 COLONIES/mL ESCHERICHIA COLI     Labs: Basic Metabolic Panel:  Recent Labs Lab 07/05/15 0923 07/07/15 2358 07/08/15 0759  07/09/15 0330 07/10/15 0314  NA 138 137 136 139 141  K 3.1* 3.1* 2.8* 2.9* 4.5  CL 104 101 102 105 107  CO2 25 19* 22 25 24   GLUCOSE 118* 190* 173* 114* 122*  BUN 18 19 16 12 15   CREATININE 0.98 1.18* 1.05* 0.95 1.02*  CALCIUM 9.2 9.3 8.4* 8.2* 8.5*  MG  --   --  1.7 1.8  --    Liver Function Tests:  Recent Labs Lab 07/07/15 2358  AST 34  ALT 24  ALKPHOS 97  BILITOT 0.5  PROT 6.8  ALBUMIN 3.7   No results for input(s): LIPASE, AMYLASE in the last 168  hours. No results for input(s): AMMONIA in the last 168 hours. CBC:  Recent Labs Lab 07/07/15 2358 07/09/15 0330 07/10/15 0314  WBC 18.1* 6.8 6.4  HGB 13.6 11.1* 11.2*  HCT 39.8 33.0* 34.1*  MCV 83.3 85.5 87.2  PLT 290 272 233   Cardiac Enzymes:  Recent Labs Lab 07/07/15 2356  TROPONINI <0.03   BNP: Invalid input(s): POCBNP CBG: No results for input(s): GLUCAP in the last 168 hours.  Time coordinating discharge:  Greater than 30 minutes  Signed:  Lucynda Rosano, DO [facility] Pager: [phone] 07/10/2015, 2:28 PM

## 2015-07-10 NOTE — Care Management Note (Signed)
Case Management Note  Patient Details  Name: Taylor Gay MRN: [id number] Date of Birth: 10/08/1946  Subjective/Objective:   Pt admitted with UTI                 Action/Plan:  Pt is independent from home with 69 year old live in son.  CM will continue to monitor for disposition needs   Expected Discharge Date:                  Expected Discharge Plan:  Home/Self Care  In-House Referral:     Discharge planning Services  CM Consult  Post Acute Care Choice:    Choice offered to:     DME Arranged:    DME Agency:     HH Arranged:    HH Agency:     Status of Service:  In process, will continue to follow  Medicare Important Message Given:  Yes-second notification given Date Medicare IM Given:    Medicare IM give by:    Date Additional Medicare IM Given:    Additional Medicare Important Message give by:     If discussed at Long Length of Stay Meetings, dates discussed:    Additional Comments:  Cherylann ParrClaxton, Hymen Arnett S, RN 07/10/2015, 11:25 AM

## 2015-07-10 NOTE — Care Management Important Message (Signed)
Important Message  Patient Details  Name: Taylor Gay MRN: [id number] Date of Birth: 07/26/1946   Medicare Important Message Given:  Yes-second notification given    Kyla BalzarineShealy, Raiven Belizaire Abena 07/10/2015, 10:53 AMImportant Message  Patient Details  Name: Taylor Gay MRN: [id number] Date of Birth: 01/10/1946   Medicare Important Message Given:  Yes-second notification given    Kyla BalzarineShealy, Obelia Bonello Abena 07/10/2015, 10:53 AM

## 2015-07-11 ENCOUNTER — Telehealth: Payer: Self-pay | Admitting: *Deleted

## 2015-07-11 NOTE — Telephone Encounter (Signed)
Patient is aware 

## 2015-07-11 NOTE — Telephone Encounter (Signed)
Yes.  As long as her oral intake is good and BP stable-ie not low.

## 2015-07-11 NOTE — Telephone Encounter (Signed)
Patient would like to know if she should start her Triam/HCTZ?  Her feet have been swelling since she was discharged from the hospital.

## 2015-07-12 ENCOUNTER — Telehealth: Payer: Self-pay | Admitting: *Deleted

## 2015-07-12 NOTE — Telephone Encounter (Signed)
Transition Care Management Follow-up Telephone Call  How have you been since you were released from the hospital? Better but weak   Do you understand why you were in the hospital? yes   Do you understand the discharge instrcutions? yes  Items Reviewed:  Medications reviewed: yes  Allergies reviewed: yes  Dietary changes reviewed: yes  Referrals reviewed: yes   Functional Questionnaire:   Activities of Daily Living (ADLs):   She states they are independent in the following: ambulation, bathing and hygiene, feeding, continence, grooming, toileting and dressing States they require assistance with the following: none   Any transportation issues/concerns?: no   Any patient concerns? Should she start her Triam/HCTZ.  See phone note   Confirmed importance and date/time of follow-up visits scheduled: yes   Confirmed with patient if condition begins to worsen call PCP or go to the ER.  Patient was given the Call-a-Nurse line [phone]: yes Patient was discharged 07/10/15 Patient was discharged to her home. Patient has an appointment with Dr [name] 07/17/15 at 11:15 am

## 2015-07-13 LAB — CULTURE, BLOOD (ROUTINE X 2)
Culture: NO GROWTH
Culture: NO GROWTH

## 2015-07-17 ENCOUNTER — Encounter: Payer: Self-pay | Admitting: Family Medicine

## 2015-07-17 ENCOUNTER — Encounter: Payer: Self-pay | Admitting: Gastroenterology

## 2015-07-17 ENCOUNTER — Ambulatory Visit (INDEPENDENT_AMBULATORY_CARE_PROVIDER_SITE_OTHER): Payer: Medicare Other | Admitting: Family Medicine

## 2015-07-17 VITALS — BP 130/80 | HR 92 | Temp 97.4°F | Wt 199.0 lb

## 2015-07-17 DIAGNOSIS — E876 Hypokalemia: Secondary | ICD-10-CM

## 2015-07-17 DIAGNOSIS — R935 Abnormal findings on diagnostic imaging of other abdominal regions, including retroperitoneum: Secondary | ICD-10-CM | POA: Diagnosis not present

## 2015-07-17 DIAGNOSIS — N390 Urinary tract infection, site not specified: Secondary | ICD-10-CM

## 2015-07-17 DIAGNOSIS — R1013 Epigastric pain: Secondary | ICD-10-CM

## 2015-07-17 LAB — BASIC METABOLIC PANEL
BUN: 16 mg/dL (ref 6–23)
CALCIUM: 9.5 mg/dL (ref 8.4–10.5)
CO2: 26 meq/L (ref 19–32)
Chloride: 98 mEq/L (ref 96–112)
Creatinine, Ser: 1.13 mg/dL (ref 0.40–1.20)
GFR: 50.98 mL/min — AB (ref >60.00)
GLUCOSE: 94 mg/dL (ref 70–99)
Potassium: 3.6 mEq/L (ref 3.5–5.1)
Sodium: 134 mEq/L — ABNORMAL LOW (ref 135–145)

## 2015-07-17 NOTE — Progress Notes (Signed)
Pre visit review using our clinic review tool, if applicable. No additional management support is needed unless otherwise documented below in the visit note. 
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
## 2015-07-17 NOTE — Patient Instructions (Signed)
We will call you regarding Ultrasound of bladder and GI referral. Follow up for any fever or recurrent urinary symptoms.

## 2015-07-18 ENCOUNTER — Other Ambulatory Visit: Payer: Self-pay | Admitting: Family Medicine

## 2015-07-18 DIAGNOSIS — R935 Abnormal findings on diagnostic imaging of other abdominal regions, including retroperitoneum: Secondary | ICD-10-CM

## 2015-08-06 ENCOUNTER — Emergency Department (HOSPITAL_COMMUNITY)
Admission: EM | Admit: 2015-08-06 | Discharge: 2015-08-07 | Disposition: A | Discharge status: Home / Self Care | Payer: Medicare Other | Attending: Emergency Medicine | Admitting: Emergency Medicine

## 2015-08-06 ENCOUNTER — Encounter: Payer: Self-pay | Admitting: Family Medicine

## 2015-08-06 ENCOUNTER — Ambulatory Visit (INDEPENDENT_AMBULATORY_CARE_PROVIDER_SITE_OTHER): Payer: Medicare Other | Admitting: Family Medicine

## 2015-08-06 ENCOUNTER — Encounter (HOSPITAL_COMMUNITY): Payer: Self-pay | Admitting: Emergency Medicine

## 2015-08-06 ENCOUNTER — Emergency Department (HOSPITAL_COMMUNITY): Payer: Medicare Other

## 2015-08-06 VITALS — BP 128/80 | HR 77 | Temp 97.5°F | Wt 197.0 lb

## 2015-08-06 DIAGNOSIS — R112 Nausea with vomiting, unspecified: Secondary | ICD-10-CM | POA: Insufficient documentation

## 2015-08-06 DIAGNOSIS — H269 Unspecified cataract: Secondary | ICD-10-CM | POA: Insufficient documentation

## 2015-08-06 DIAGNOSIS — Z79899 Other long term (current) drug therapy: Secondary | ICD-10-CM | POA: Diagnosis not present

## 2015-08-06 DIAGNOSIS — R935 Abnormal findings on diagnostic imaging of other abdominal regions, including retroperitoneum: Secondary | ICD-10-CM

## 2015-08-06 DIAGNOSIS — Z862 Personal history of diseases of the blood and blood-forming organs and certain disorders involving the immune mechanism: Secondary | ICD-10-CM | POA: Diagnosis not present

## 2015-08-06 DIAGNOSIS — Z87440 Personal history of urinary (tract) infections: Secondary | ICD-10-CM | POA: Diagnosis not present

## 2015-08-06 DIAGNOSIS — R110 Nausea: Secondary | ICD-10-CM

## 2015-08-06 DIAGNOSIS — R197 Diarrhea, unspecified: Secondary | ICD-10-CM | POA: Diagnosis not present

## 2015-08-06 DIAGNOSIS — F419 Anxiety disorder, unspecified: Secondary | ICD-10-CM | POA: Diagnosis not present

## 2015-08-06 DIAGNOSIS — I10 Essential (primary) hypertension: Secondary | ICD-10-CM | POA: Diagnosis not present

## 2015-08-06 DIAGNOSIS — D649 Anemia, unspecified: Secondary | ICD-10-CM | POA: Diagnosis not present

## 2015-08-06 DIAGNOSIS — R634 Abnormal weight loss: Secondary | ICD-10-CM

## 2015-08-06 LAB — POCT URINALYSIS DIPSTICK
Bilirubin, UA: NEGATIVE
Blood, UA: NEGATIVE
GLUCOSE UA: NEGATIVE
Ketones, UA: NEGATIVE
LEUKOCYTES UA: NEGATIVE
NITRITE UA: NEGATIVE
PH UA: 6
Protein, UA: NEGATIVE
Spec Grav, UA: >=1.03
Urobilinogen, UA: 0.2

## 2015-08-06 LAB — COMPREHENSIVE METABOLIC PANEL
ALBUMIN: 3.8 g/dL (ref 3.5–5.0)
ALK PHOS: 105 U/L (ref 38–126)
ALT: 28 U/L (ref 14–54)
AST: 35 U/L (ref 15–41)
Anion gap: 15 (ref 5–15)
BUN: 14 mg/dL (ref 6–20)
CHLORIDE: 100 mmol/L — AB (ref 101–111)
CO2: 22 mmol/L (ref 22–32)
Calcium: 9.2 mg/dL (ref 8.9–10.3)
Creatinine, Ser: 1.13 mg/dL — ABNORMAL HIGH (ref 0.44–1.00)
GFR calc Af Amer: 57 mL/min — ABNORMAL LOW (ref >60)
GFR calc non Af Amer: 49 mL/min — ABNORMAL LOW (ref >60)
GLUCOSE: 169 mg/dL — AB (ref 65–99)
POTASSIUM: 3 mmol/L — AB (ref 3.5–5.1)
Sodium: 137 mmol/L (ref 135–145)
Total Bilirubin: 0.5 mg/dL (ref 0.3–1.2)
Total Protein: 6.9 g/dL (ref 6.5–8.1)

## 2015-08-06 LAB — URINE MICROSCOPIC-ADD ON

## 2015-08-06 LAB — CBC WITH DIFFERENTIAL/PLATELET
BASOS ABS: 0 10*3/uL (ref 0.0–0.1)
BASOS PCT: 0.5 % (ref 0.0–3.0)
Eosinophils Absolute: 0.2 10*3/uL (ref 0.0–0.7)
Eosinophils Relative: 1.9 % (ref 0.0–5.0)
HEMATOCRIT: 39.7 % (ref 36.0–46.0)
Hemoglobin: 13.4 g/dL (ref 12.0–15.0)
LYMPHS PCT: 25.2 % (ref 12.0–46.0)
Lymphs Abs: 2.2 10*3/uL (ref 0.7–4.0)
MCHC: 33.7 g/dL (ref 30.0–36.0)
MCV: 86 fl (ref 78.0–100.0)
MONOS PCT: 6.9 % (ref 3.0–12.0)
Monocytes Absolute: 0.6 10*3/uL (ref 0.1–1.0)
NEUTROS ABS: 5.7 10*3/uL (ref 1.4–7.7)
Neutrophils Relative %: 65.5 % (ref 43.0–77.0)
PLATELETS: 283 10*3/uL (ref 150.0–400.0)
RBC: 4.61 Mil/uL (ref 3.87–5.11)
RDW: 15.3 % (ref 11.5–15.5)
WBC: 8.6 10*3/uL (ref 4.0–10.5)

## 2015-08-06 LAB — CBC
HEMATOCRIT: 41.5 % (ref 36.0–46.0)
HEMOGLOBIN: 14.7 g/dL (ref 12.0–15.0)
MCH: 29.8 pg (ref 26.0–34.0)
MCHC: 35.4 g/dL (ref 30.0–36.0)
MCV: 84.2 fL (ref 78.0–100.0)
Platelets: 325 10*3/uL (ref 150–400)
RBC: 4.93 MIL/uL (ref 3.87–5.11)
RDW: 14.7 % (ref 11.5–15.5)
WBC: 20 10*3/uL — ABNORMAL HIGH (ref 4.0–10.5)

## 2015-08-06 LAB — URINALYSIS, ROUTINE W REFLEX MICROSCOPIC
Bilirubin Urine: NEGATIVE
Glucose, UA: NEGATIVE mg/dL
Hgb urine dipstick: NEGATIVE
Ketones, ur: NEGATIVE mg/dL
Nitrite: NEGATIVE
PROTEIN: NEGATIVE mg/dL
Specific Gravity, Urine: 1.016 (ref 1.005–1.030)
Urobilinogen, UA: 0.2 mg/dL (ref 0.0–1.0)
pH: 6 (ref 5.0–8.0)

## 2015-08-06 LAB — LIPASE, BLOOD: Lipase: 26 U/L (ref 22–51)

## 2015-08-06 MED ORDER — SODIUM CHLORIDE 0.9 % IV BOLUS (SEPSIS)
1000.0000 mL | Freq: Once | INTRAVENOUS | Status: AC
  Administered 2015-08-07: 1000 mL via INTRAVENOUS

## 2015-08-06 NOTE — ED Notes (Signed)
Pt to xray

## 2015-08-06 NOTE — Progress Notes (Signed)
Pre visit review using our clinic review tool, if applicable. No additional management support is needed unless otherwise documented below in the visit note. 
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
## 2015-08-06 NOTE — ED Notes (Signed)
Pt. reports multiple emesis , diarrhea and generalized abdominal cramping onset today , seen by her PCP today , pt. stated unable to diagnose / unclear etiology.

## 2015-08-07 ENCOUNTER — Emergency Department (HOSPITAL_COMMUNITY): Payer: Medicare Other

## 2015-08-07 ENCOUNTER — Encounter (HOSPITAL_COMMUNITY): Payer: Self-pay

## 2015-08-07 LAB — POC OCCULT BLOOD, ED: FECAL OCCULT BLD: NEGATIVE

## 2015-08-07 MED ORDER — POTASSIUM CHLORIDE CRYS ER 20 MEQ PO TBCR
40.0000 meq | EXTENDED_RELEASE_TABLET | Freq: Once | ORAL | Status: AC
  Administered 2015-08-07: 40 meq via ORAL
  Filled 2015-08-07: qty 2

## 2015-08-07 MED ORDER — METOCLOPRAMIDE HCL 5 MG/ML IJ SOLN
10.0000 mg | Freq: Once | INTRAMUSCULAR | Status: AC
  Administered 2015-08-07: 10 mg via INTRAVENOUS
  Filled 2015-08-07: qty 2

## 2015-08-07 MED ORDER — IOHEXOL 300 MG/ML  SOLN
25.0000 mL | Freq: Once | INTRAMUSCULAR | Status: AC | PRN
  Administered 2015-08-07: 25 mL via ORAL

## 2015-08-07 MED ORDER — VANCOMYCIN 50 MG/ML ORAL SOLUTION
125.0000 mg | Freq: Four times a day (QID) | ORAL | Status: DC
  Administered 2015-08-07: 125 mg via ORAL
  Filled 2015-08-07: qty 2.5

## 2015-08-07 MED ORDER — IOHEXOL 300 MG/ML  SOLN
100.0000 mL | Freq: Once | INTRAMUSCULAR | Status: AC | PRN
  Administered 2015-08-07: 100 mL via INTRAVENOUS

## 2015-08-07 MED ORDER — METOCLOPRAMIDE HCL 10 MG PO TABS: 10.0000 mg | ORAL_TABLET | Freq: Four times a day (QID) | ORAL | Status: DC

## 2015-08-07 MED ORDER — VANCOMYCIN HCL 125 MG PO CAPS
125.0000 mg | ORAL_CAPSULE | Freq: Four times a day (QID) | ORAL | Status: DC
Start: 2015-08-07 — End: 2015-10-15

## 2015-08-07 NOTE — ED Provider Notes (Signed)
1:27 AM Patient signed out to me by Dr. Karma Ganja. Patient pending CT abdomen pelvis. Vitals stable and patient afebrile.   2:56 AM CT abdomen pelvis shows no acute changes. No stool produced here. Patient will have treatment initiated for C. Diff and follow up with PCP. Vitals stable and patient afebrile. Patient will be discharged with Reglan for nausea.   Results for orders placed or performed during the hospital encounter of 08/06/15  Lipase, blood  Result Value Ref Range   Lipase 26 22 - 51 U/L  Comprehensive metabolic panel  Result Value Ref Range   Sodium 137 135 - 145 mmol/L   Potassium 3.0 (L) 3.5 - 5.1 mmol/L   Chloride 100 (L) 101 - 111 mmol/L   CO2 22 22 - 32 mmol/L   Glucose, Bld 169 (H) 65 - 99 mg/dL   BUN 14 6 - 20 mg/dL   Creatinine, Ser 0.98 (H) 0.44 - 1.00 mg/dL   Calcium 9.2 8.9 - 11.9 mg/dL   Total Protein 6.9 6.5 - 8.1 g/dL   Albumin 3.8 3.5 - 5.0 g/dL   AST 35 15 - 41 U/L   ALT 28 14 - 54 U/L   Alkaline Phosphatase 105 38 - 126 U/L   Total Bilirubin 0.5 0.3 - 1.2 mg/dL   GFR calc non Af Amer 49 (L) >60 mL/min   GFR calc Af Amer 57 (L) >60 mL/min   Anion gap 15 5 - 15  CBC  Result Value Ref Range   WBC 20.0 (H) 4.0 - 10.5 K/uL   RBC 4.93 3.87 - 5.11 MIL/uL   Hemoglobin 14.7 12.0 - 15.0 g/dL   HCT 14.7 82.9 - 56.2 %   MCV 84.2 78.0 - 100.0 fL   MCH 29.8 26.0 - 34.0 pg   MCHC 35.4 30.0 - 36.0 g/dL   RDW 13.0 86.5 - 78.4 %   Platelets 325 150 - 400 K/uL  Urinalysis, Routine w reflex microscopic (not at [facility])  Result Value Ref Range   Color, Urine YELLOW YELLOW   APPearance CLOUDY (A) CLEAR   Specific Gravity, Urine 1.016 1.005 - 1.030   pH 6.0 5.0 - 8.0   Glucose, UA NEGATIVE NEGATIVE mg/dL   Hgb urine dipstick NEGATIVE NEGATIVE   Bilirubin Urine NEGATIVE NEGATIVE   Ketones, ur NEGATIVE NEGATIVE mg/dL   Protein, ur NEGATIVE NEGATIVE mg/dL   Urobilinogen, UA 0.2 0.0 - 1.0 mg/dL   Nitrite NEGATIVE NEGATIVE   Leukocytes, UA TRACE (A) NEGATIVE  Urine  microscopic-add on  Result Value Ref Range   Squamous Epithelial / LPF FEW (A) RARE   WBC, UA 3-6 <3 WBC/hpf   RBC / HPF 0-2 <3 RBC/hpf   Bacteria, UA RARE RARE   Casts HYALINE CASTS (A) NEGATIVE   Ct Abdomen Pelvis W Contrast  08/07/2015   CLINICAL DATA:  Acute onset of lower abdominal pain and epigastric abdominal pain. Severe nausea and diarrhea. Fever. Initial encounter.  EXAM: CT ABDOMEN AND PELVIS WITH CONTRAST  TECHNIQUE: Multidetector CT imaging of the abdomen and pelvis was performed using the standard protocol following bolus administration of intravenous contrast.  CONTRAST:  OMNIPAQUE IOHEXOL 300 MG/ML  SOLN  COMPARISON:  CT of the abdomen pelvis from 06/28/2015, and right upper quadrant ultrasound performed earlier today at 12:08 a.m.  FINDINGS: Minimal bibasilar atelectasis is noted.  A 1.3 cm cyst is noted at the medial right hepatic lobe. The liver and spleen are otherwise unremarkable. The gallbladder is within normal limits. The  pancreas and adrenal glands are unremarkable.  The kidneys are unremarkable in appearance. There is no evidence of hydronephrosis. No renal or ureteral stones are seen. No perinephric stranding is appreciated.  No free fluid is identified. The small bowel is unremarkable in appearance. The stomach is within normal limits. No acute vascular abnormalities are seen. Minimal calcification is noted along the distal abdominal aorta.  The patient is status post appendectomy. The colon is unremarkable in appearance.  The bladder is mildly distended and grossly unremarkable. The patient is status post hysterectomy. No suspicious adnexal masses are seen. No inguinal lymphadenopathy is seen.  No acute osseous abnormalities are identified. Vacuum phenomenon is noted at L3-L4, with disc space narrowing and endplate sclerotic change. There is minimal grade 1 anterolisthesis of L3 on L4, reflecting underlying facet disease.  IMPRESSION: 1. No acute abnormality seen to  explain the patient's symptoms. 2. Small hepatic cyst noted. 3. Mild degenerative change at L3-L4.   Electronically Signed   By: Roanna Raider M.D.   On: 08/07/2015 01:55   US Abdomen Limited  08/07/2015   CLINICAL DATA:  Right upper quadrant abdominal pain and diarrhea  EXAM: US ABDOMEN LIMITED - RIGHT UPPER QUADRANT  COMPARISON:  Abdominal CT 06/28/2015  FINDINGS: Gallbladder:  No gallstones or wall thickening visualized. No sonographic Murphy sign noted.  Common bile duct:  Diameter: 2 mm  Liver:  Echogenic liver with poor acoustic penetration. No focal lesion is seen (including a cyst noted on 06/28/2015 CT). Antegrade flow in the imaged portal venous system.  IMPRESSION: 1. Normal gallbladder. 2. Hepatic steatosis.   Electronically Signed   By: Marnee Spring M.D.   On: 08/07/2015 00:39      Emilia Beck, PA-C 08/07/15 1610  Jerelyn Scott, MD 08/09/15 [phone]

## 2015-08-07 NOTE — ED Notes (Signed)
Discharge instructions and prescriptions reviewed, voiced understanding. 

## 2015-08-07 NOTE — Progress Notes (Signed)
Pain prescriptions faxed to facility in [place]  [phone]) Pain med given as per requested by the patient,"i have an hour to travel."

## 2015-08-07 NOTE — Discharge Instructions (Signed)
Take Vancomycin as directed until gone. Take Reglan as needed for nausea. Follow up with Dr. Russella Dar for further evaluation. Return to the ED with worsening or concerning symptoms.

## 2015-08-07 NOTE — ED Notes (Signed)
Informed EDP that pt is nauseous, but was able to keep fluids down

## 2015-08-07 NOTE — ED Notes (Signed)
Pt taken to CT.
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
## 2015-08-08 LAB — URINE CULTURE: Colony Count: 10000

## 2015-08-09 ENCOUNTER — Telehealth: Payer: Self-pay | Admitting: Family Medicine

## 2015-08-09 ENCOUNTER — Other Ambulatory Visit: Payer: Self-pay | Admitting: Family Medicine

## 2015-08-09 LAB — URINE CULTURE

## 2015-08-09 NOTE — Telephone Encounter (Signed)
Pt states that she has stopped taking Vancomycin, it was making her not be able to sleep. Pt is taking a Probiotic. Pt is wanting to know do you think she needs to be on a antibiotic.

## 2015-08-09 NOTE — Telephone Encounter (Signed)
Pt would like a call back she has questions about some medicine she is taking. She would like a call back

## 2015-08-09 NOTE — Telephone Encounter (Signed)
i could not see that the ER did a stool for C dif- but they were concerned for that possibility. She should try to stay on the Vancomycin.

## 2015-08-10 NOTE — Telephone Encounter (Signed)
Post ED Visit - Positive Culture Follow-up  Culture report reviewed by antimicrobial stewardship pharmacist:  Wes Dulaney, Pharm.D., BCPS  Celedonio Miyamoto, [address].D., BCPS  Georgina Pillion, Pharm.D., BCPS  [place], [place].D., BCPS, AAHIVP  Estella Husk, Pharm.D., BCPS, AAHIVP  Elder Cyphers, [address].D., BCPS Okey Regal PharmD  Urine culture multi species present asymptomatic and no further patient follow-up is required at this time.  Berle Mull 08/10/2015, 9:50 AM

## 2015-08-12 ENCOUNTER — Telehealth: Payer: Self-pay | Admitting: Gastroenterology

## 2015-08-12 NOTE — Telephone Encounter (Signed)
Pt informed

## 2015-08-12 NOTE — Telephone Encounter (Signed)
Patient has been treated x 3 with antibiotics for UTI and was hospitalized in January with e coli.  She is currently being treated with vancomycin for presumed c-diff.  She was not able to give a sample in the ER but they assumed from the multiple doses of antibiotics for UTI that was her issue.  She reports that she is not having any diarrhea any more, but has terrible nausea.  She reports that phenergan nor zofran help.  She can't come in until after 08/23/15.  She will come see Dr. Russella Dar on 09/03/15 3:00

## 2015-08-16 ENCOUNTER — Other Ambulatory Visit: Payer: Self-pay | Admitting: Family Medicine

## 2015-08-16 ENCOUNTER — Ambulatory Visit
Admission: RE | Admit: 2015-08-16 | Discharge: 2015-08-16 | Disposition: A | Discharge status: Home / Self Care | Payer: Medicare Other | Source: Ambulatory Visit | Attending: Family Medicine | Admitting: Family Medicine

## 2015-08-16 DIAGNOSIS — R110 Nausea: Secondary | ICD-10-CM

## 2015-08-16 DIAGNOSIS — R634 Abnormal weight loss: Secondary | ICD-10-CM

## 2015-08-16 DIAGNOSIS — R935 Abnormal findings on diagnostic imaging of other abdominal regions, including retroperitoneum: Secondary | ICD-10-CM

## 2015-08-16 DIAGNOSIS — R9389 Abnormal findings on diagnostic imaging of other specified body structures: Secondary | ICD-10-CM

## 2015-08-19 ENCOUNTER — Ambulatory Visit
Admission: RE | Admit: 2015-08-19 | Discharge: 2015-08-19 | Disposition: A | Discharge status: Home / Self Care | Payer: Medicare Other | Source: Ambulatory Visit | Attending: Family Medicine | Admitting: Family Medicine

## 2015-08-19 ENCOUNTER — Encounter: Payer: Self-pay | Admitting: Family Medicine

## 2015-08-19 DIAGNOSIS — R110 Nausea: Secondary | ICD-10-CM

## 2015-08-19 DIAGNOSIS — R634 Abnormal weight loss: Secondary | ICD-10-CM

## 2015-08-19 DIAGNOSIS — R9389 Abnormal findings on diagnostic imaging of other specified body structures: Secondary | ICD-10-CM

## 2015-09-03 ENCOUNTER — Ambulatory Visit (INDEPENDENT_AMBULATORY_CARE_PROVIDER_SITE_OTHER): Payer: Medicare Other | Admitting: Gastroenterology

## 2015-09-03 ENCOUNTER — Encounter: Payer: Self-pay | Admitting: Gastroenterology

## 2015-09-03 VITALS — BP 110/60 | HR 70 | Ht 68.0 in | Wt 201.0 lb

## 2015-09-03 DIAGNOSIS — R194 Change in bowel habit: Secondary | ICD-10-CM

## 2015-09-03 DIAGNOSIS — R109 Unspecified abdominal pain: Secondary | ICD-10-CM

## 2015-09-03 DIAGNOSIS — Z800 Family history of malignant neoplasm of digestive organs: Secondary | ICD-10-CM

## 2015-09-03 DIAGNOSIS — R110 Nausea: Secondary | ICD-10-CM | POA: Diagnosis not present

## 2015-09-03 MED ORDER — RANITIDINE HCL 300 MG PO TABS: 300.0000 mg | ORAL_TABLET | Freq: Two times a day (BID) | ORAL | Status: DC

## 2015-09-03 MED ORDER — NA SULFATE-K SULFATE-MG SULF 17.5-3.13-1.6 GM/177ML PO SOLN
1.0000 | Freq: Once | ORAL | Status: DC
Start: 2015-09-03 — End: 2015-10-15

## 2015-09-03 NOTE — Progress Notes (Signed)
    History of Present Illness: This is a 69 year old female referred by Kristian Covey, MD for the evaluation of change in bowel habits, nausea, abdominal pain in multiple sites. She has a family history of colon cancer and underwent colonoscopy previously in April 2010 which showed only mild diverticulosis. She did not return for recommended colonoscopy in 2015. She underwent EGD in April 2010 for evaluation of dysphagia and chest pain. A small pyloric ulcer and a small duodenal ulcer were noted. H. pylori was negative. Esophageal dilation performed without a stricture noted.  She was hospitalized for urinary tract infection in July and treated with Rocephin IV and then cephalexin for total of 14 days of therapy. Following course of antibiotics she developed severe diarrhea and worsening nausea. She was evaluated in the ED in August and treated with vancomycin for presumed C. difficile. Abd ultrasound and abdominal/pelvic CT were performed in the ED were negative for gastrointestinal pathology. Stool study was never obtained to confirm the diagnosis. Stool FOB was negative. Her diarrhea gradually resolved and now she notes a change in bowel habits or pre-illness habits-she has 2-3 loose, stringy stools per day. She is no longer having diarrhea. Since she was hospitalized for UTI she has had problems with significant nausea that have not improved. Nausea is not changed with food or bowel movements. She notes pains in her right flank, epigastrium and lower abdomen. The pains are fleeting and not necessarily associated with any digestive function. She has intolerances to multiple PPIs. She has not been treated with a PPI or H2 RA recently.  Review of Systems: Pertinent positive and negative review of systems were noted in the above HPI section. All other review of systems were otherwise negative.  Current Medications, Allergies, Past Medical History, Past Surgical History, Family History and Social History  were reviewed in [employer] record.  Physical Exam: General: Well developed, well nourished, no acute distress Head: Normocephalic and atraumatic Eyes:  sclerae anicteric, EOMI Ears: Normal auditory acuity Mouth: No deformity or lesions Neck: Supple, no masses or thyromegaly Lungs: Clear throughout to auscultation Heart: Regular rate and rhythm; no murmurs, rubs or bruits Abdomen: Soft, non tender and non distended. No masses, hepatosplenomegaly or hernias noted. Normal Bowel sounds Rectal: deferred to colonoscopy Musculoskeletal: Symmetrical with no gross deformities  Skin: No lesions on visible extremities Pulses:  Normal pulses noted Extremities: No clubbing, cyanosis, edema or deformities noted Neurological: Alert oriented x 4, grossly nonfocal Cervical Nodes:  No significant cervical adenopathy Inguinal Nodes: No significant inguinal adenopathy Psychological:  Alert and cooperative. Normal mood and affect  Assessment and Recommendations:  1. Change in bowel habits, family history of colon cancer, nausea, history of gastric ulcer and duodenal ulcer, intolerance to several PPIs, abdominal pain-multiple site. Avoid all aspirin and NSAID products. Begin ranitidine 300 mg twice daily. Begin a daily probiotic for one month. Consider adding an anti-spasmodic. The risks, benefits, and alternatives to colonoscopy with possible biopsy, possible polypectomy and possible injection of hemorrhoids were discussed with the patient and they consent to proceed. The risks (including bleeding, perforation, infection, missed lesions, medication reactions and possible hospitalization or surgery if complications occur), benefits, and alternatives to endoscopy with possible biopsy and possible dilation were discussed with the patient and they consent to proceed.    cc: Kristian Covey, MD [address] [place], [place] [address]

## 2015-09-03 NOTE — Patient Instructions (Addendum)
We have sent the following medications to your pharmacy for you to pick up at your convenience:ranitidine.    You have been scheduled for an endoscopy and colonoscopy. Please follow the written instructions given to you at your visit today. Please pick up your prep supplies at the pharmacy within the next 1-3 days. If you use inhalers (even only as needed), please bring them with you on the day of your procedure. Your physician has requested that you go to www.startemmi.com and enter the access code given to you at your visit today. This web site gives a general overview about your procedure. However, you should still follow specific instructions given to you by our office regarding your preparation for the procedure.  Thank you for choosing me and [facility].  Venita Lick. Pleas Koch., MD., Clementeen Graham  cc: Evelena Peat, MD

## 2015-09-30 ENCOUNTER — Ambulatory Visit: Payer: Medicare Other | Admitting: Gastroenterology

## 2015-10-08 ENCOUNTER — Other Ambulatory Visit: Payer: Self-pay | Admitting: Family Medicine

## 2015-10-15 ENCOUNTER — Ambulatory Visit (AMBULATORY_SURGERY_CENTER): Payer: Medicare Other | Admitting: Gastroenterology

## 2015-10-15 ENCOUNTER — Encounter: Payer: Self-pay | Admitting: Gastroenterology

## 2015-10-15 VITALS — BP 137/68 | HR 66 | Temp 97.8°F | Resp 16 | Ht 68.0 in | Wt 201.0 lb

## 2015-10-15 DIAGNOSIS — Z800 Family history of malignant neoplasm of digestive organs: Secondary | ICD-10-CM

## 2015-10-15 DIAGNOSIS — K929 Disease of digestive system, unspecified: Secondary | ICD-10-CM

## 2015-10-15 DIAGNOSIS — R110 Nausea: Secondary | ICD-10-CM | POA: Diagnosis not present

## 2015-10-15 DIAGNOSIS — Z1211 Encounter for screening for malignant neoplasm of colon: Secondary | ICD-10-CM

## 2015-10-15 DIAGNOSIS — K5190 Ulcerative colitis, unspecified, without complications: Secondary | ICD-10-CM | POA: Diagnosis not present

## 2015-10-15 DIAGNOSIS — R1084 Generalized abdominal pain: Secondary | ICD-10-CM | POA: Diagnosis not present

## 2015-10-15 DIAGNOSIS — K2950 Unspecified chronic gastritis without bleeding: Secondary | ICD-10-CM | POA: Diagnosis not present

## 2015-10-15 MED ORDER — SODIUM CHLORIDE 0.9 % IV SOLN: 500.0000 mL | INTRAVENOUS | Status: DC

## 2015-10-15 MED ORDER — DICYCLOMINE HCL 10 MG PO CAPS
10.0000 mg | ORAL_CAPSULE | Freq: Three times a day (TID) | ORAL | Status: DC
Start: 2015-10-15 — End: 2015-12-21

## 2015-10-15 NOTE — Op Note (Signed)
[facility] [address] [place], [id number]   COLONOSCOPY PROCEDURE REPORT  PATIENT: Taylor Gay, Taylor Gay  MR#: [id number] BIRTHDATE: 1946-11-05 , 69  yrs. old GENDER: female ENDOSCOPIST: Meryl DareMalcolm T Sunnie Odden, MD, [facility] IvFACG REFERRED XB:JYNWGBY:Bruce Caryl NeverBurchette, M.D. PROCEDURE DATE:  10/15/2015 PROCEDURE:   Colonoscopy, screening and Colonoscopy with biopsy First Screening Colonoscopy - Avg.  risk and is 50 yrs.  old or older - No.  Prior Negative Screening - Now for repeat screening. Less than 10 yrs Prior Negative Screening - Now for repeat screening.  Above average risk  History of Adenoma - Now for follow-up colonoscopy & has been > or = to 3 yrs.  N/A  Polyps removed today? No Recommend repeat exam, <10 yrs? Yes high risk ASA CLASS:   Class II INDICATIONS:Screening for colonic neoplasia and FH Colon or Rectal Adenocarcinoma. MEDICATIONS: Monitored anesthesia care and Propofol 200 mg IV DESCRIPTION OF PROCEDURE:   After the risks benefits and alternatives of the procedure were thoroughly explained, informed consent was obtained.  The digital rectal exam revealed no abnormalities of the rectum.   The LB [id number]  endoscope was introduced through the anus and advanced to the cecum, which was identified by both the appendix and ileocecal valve. No adverse events experienced.   The quality of the prep was excellent. (Suprep was used)  The instrument was then slowly withdrawn as the colon was fully examined. Estimated blood loss is zero unless otherwise noted in this procedure report.    COLON FINDINGS: Abnormal mucosa was found in the descending colon and at the splenic flexure.  The mucosa was erythematous in patches.  Multiple biopsies of the area were performed.   The examination was otherwise normal.  Retroflexed views revealed no abnormalities. The time to cecum = 1.4 Withdrawal time = 8.3   The scope was withdrawn and the procedure completed. COMPLICATIONS: There were  no immediate complications.  ENDOSCOPIC IMPRESSION: 1.   Abnormal mucosa in the descending colon and at the splenic flexure; multiple biopsies performed 2.   The examination was otherwise normal  RECOMMENDATIONS: 1.  Await pathology results 2.  Repeat Colonoscopy in 5 years.  eSigned:  Meryl DareMalcolm T Anaika Santillano, MD, [facility] L[facility]ACG 10/15/2015 11:00 AM

## 2015-10-15 NOTE — Patient Instructions (Signed)
AVOID ASPIRIN, ASPIRIN PRODUCTS AND NSAIDS(MOTRIN, ADVIL, ALEVE, IBUPROFEN ETC)  HANDOUTS GIVEN FOR ANTI REFLUX, ESOPHAGITIS, AND GASTRITIS.   YOU HAD AN ENDOSCOPIC PROCEDURE TODAY AT THE [facility]:   Refer to the procedure report that was given to you for any specific questions about what was found during the examination.  If the procedure report does not answer your questions, please call your gastroenterologist to clarify.  If you requested that your care partner not be given the details of your procedure findings, then the procedure report has been included in a sealed envelope for you to review at your convenience later.  YOU SHOULD EXPECT: Some feelings of bloating in the abdomen. Passage of more gas than usual.  Walking can help get rid of the air that was put into your GI tract during the procedure and reduce the bloating. If you had a lower endoscopy (such as a colonoscopy or flexible sigmoidoscopy) you may notice spotting of blood in your stool or on the toilet paper. If you underwent a bowel prep for your procedure, you may not have a normal bowel movement for a few days.  Please Note:  You might notice some irritation and congestion in your nose or some drainage.  This is from the oxygen used during your procedure.  There is no need for concern and it should clear up in a day or so.  SYMPTOMS TO REPORT IMMEDIATELY:   Following lower endoscopy (colonoscopy or flexible sigmoidoscopy):  Excessive amounts of blood in the stool  Significant tenderness or worsening of abdominal pains  Swelling of the abdomen that is new, acute  Fever of 100F or higher   Following upper endoscopy (EGD)  Vomiting of blood or coffee ground material  New chest pain or pain under the shoulder blades  Painful or persistently difficult swallowing  New shortness of breath  Fever of 100F or higher  Black, tarry-looking stools  For urgent or emergent issues, a gastroenterologist can be  reached at any hour by calling [phone].   DIET: Your first meal following the procedure should be a small meal and then it is ok to progress to your normal diet. Heavy or fried foods are harder to digest and may make you feel nauseous or bloated.  Likewise, meals heavy in dairy and vegetables can increase bloating.  Drink plenty of fluids but you should avoid alcoholic beverages for 24 hours.  ACTIVITY:  You should plan to take it easy for the rest of today and you should NOT DRIVE or use heavy machinery until tomorrow (because of the sedation medicines used during the test).    FOLLOW UP: Our staff will call the number listed on your records the next business day following your procedure to check on you and address any questions or concerns that you may have regarding the information given to you following your procedure. If we do not reach you, we will leave a message.  However, if you are feeling well and you are not experiencing any problems, there is no need to return our call.  We will assume that you have returned to your regular daily activities without incident.  If any biopsies were taken you will be contacted by phone or by letter within the next 1-3 weeks.  Please call us at [phone] if you have not heard about the biopsies in 3 weeks.    SIGNATURES/CONFIDENTIALITY: You and/or your care partner have signed paperwork which will be entered into your electronic medical  record.  These signatures attest to the fact that that the information above on your After Visit Summary has been reviewed and is understood.  Full responsibility of the confidentiality of this discharge information lies with you and/or your care-partner. 

## 2015-10-15 NOTE — Progress Notes (Signed)
Called to room to assist during endoscopic procedure.  Patient ID and intended procedure confirmed with present staff. Received instructions for my participation in the procedure from the performing physician.  

## 2015-10-15 NOTE — Op Note (Signed)
[facility] [address] [place], [id number]   ENDOSCOPY PROCEDURE REPORT  PATIENT: Taylor Gay, Taylor Gay  MR#: [id number] BIRTHDATE: 03/03/1946 , 69  yrs. old GENDER: female ENDOSCOPIST: Meryl DareMalcolm T Mary-Anne Polizzi, MD, [facility] GrahamFACG REFERRED BY:  Evelena PeatBruce Burchette, M.D. PROCEDURE DATE:  10/15/2015 PROCEDURE:  EGD w/ biopsy ASA CLASS:     Class II INDICATIONS:  abdominal pain, nausea. MEDICATIONS: Monitored anesthesia care, Residual sedation present, and Propofol 150 mg IV TOPICAL ANESTHETIC: none DESCRIPTION OF PROCEDURE: After the risks benefits and alternatives of the procedure were thoroughly explained, informed consent was obtained.  The LB [id number] endoscope was introduced through the mouth and advanced to the second portion of the duodenum , Without limitations.  The instrument was slowly withdrawn as the mucosa was fully examined.  ESOPHAGUS: There was LA Class A esophagitis (One or more mucosal breaks < 5 mm in maximal length) noted.  The z line appeared variable and biopsies were taken.  The esophagus was otherwise unremarkable. STOMACH: Mild erosive gastritis  was found in the gastric antrum and gastric body.  Multiple biopsies were performed.   The stomach otherwise appeared normal. DUODENUM: The duodenal mucosa showed no abnormalities in the bulb and 2nd part of the duodenum.  Retroflexed views revealed no abnormalities.     The scope was then withdrawn from the patient and the procedure completed.  COMPLICATIONS: There were no immediate complications.  ENDOSCOPIC IMPRESSION: 1.   LA Class A esophagitis 2.   The z-line was variable; biopsies obtained 3.   Erosive gastritis in the gastric antrum and gastric body; multiple biopsies performed  RECOMMENDATIONS: 1.  Anti-reflux regimen long term 2.  Await pathology results 3.  Continue ranititine 300 mg po bid 4.  Bentyl 10 mg tid ac, 1 year of refills 5.  Avoid ASA/NSAIDs  eSigned:  Meryl DareMalcolm T Gery Sabedra, MD, [facility]  10/15/2015 11:06 AM

## 2015-10-15 NOTE — Progress Notes (Signed)
Patient awakening,vss,report to rn 

## 2015-10-16 ENCOUNTER — Telehealth: Payer: Self-pay | Admitting: *Deleted

## 2015-10-16 NOTE — Telephone Encounter (Signed)
  Follow up Call-  Call back number 10/15/2015  Post procedure Call Back phone  # [phone]  Permission to leave phone message Yes     Patient questions:  Do you have a fever, pain , or abdominal swelling? No. Pain Score  0 *  Have you tolerated food without any problems? Yes.    Have you been able to return to your normal activities? Yes.    Do you have any questions about your discharge instructions: Diet   No. Medications  No. Follow up visit  No.  Do you have questions or concerns about your Care? No.  Actions: * If pain score is 4 or above: No action needed, pain <4.

## 2015-10-21 ENCOUNTER — Telehealth: Payer: Self-pay | Admitting: Gastroenterology

## 2015-10-21 NOTE — Telephone Encounter (Signed)
Path is still pending.  She is notified she will be notified by letter when the pathology letter is back.

## 2015-10-24 ENCOUNTER — Encounter: Payer: Self-pay | Admitting: Gastroenterology

## 2015-10-24 ENCOUNTER — Telehealth: Payer: Self-pay | Admitting: Gastroenterology

## 2015-10-24 NOTE — Telephone Encounter (Signed)
Patient reports that she had vomiting last night after several doses of dicyclomine.  She stopped both ranitidine and the dicyclomine and feels much better.  She is encouraged to continue ranitidine and use the dicyclomine as needed.  She will call back for any additional questions or concerns.

## 2015-10-29 ENCOUNTER — Ambulatory Visit (INDEPENDENT_AMBULATORY_CARE_PROVIDER_SITE_OTHER): Payer: Medicare Other

## 2015-10-29 DIAGNOSIS — Z23 Encounter for immunization: Secondary | ICD-10-CM

## 2015-11-01 ENCOUNTER — Encounter: Payer: Self-pay | Admitting: Family Medicine

## 2015-11-20 ENCOUNTER — Other Ambulatory Visit: Payer: Self-pay | Admitting: Family Medicine

## 2015-11-20 ENCOUNTER — Encounter: Payer: Self-pay | Admitting: Family Medicine

## 2015-11-29 ENCOUNTER — Telehealth: Payer: Self-pay | Admitting: Family Medicine

## 2015-11-29 NOTE — Telephone Encounter (Signed)
OK to add A1C

## 2015-11-29 NOTE — Telephone Encounter (Signed)
Patient has been added to schedule as requested, orders for the A1C needs to be added for lab appointment.

## 2015-11-29 NOTE — Telephone Encounter (Signed)
Pt is actually due for a CPE after 12/18/2015. Please schedule fasting and we can discuss then. Thanks.

## 2015-11-29 NOTE — Telephone Encounter (Signed)
CPE labs scheduled: 12/17/15 CPE scheduled with PCP: 12/24/15

## 2015-11-29 NOTE — Telephone Encounter (Signed)
Patient would like to get her A1C check. Is it ok to schedule this appointment?

## 2015-11-29 NOTE — Telephone Encounter (Signed)
Pt has a pending appt on 12/27 for labs and scheduled 1/3. Please advise on labs. Regular CPE with A1C?

## 2015-11-29 NOTE — Telephone Encounter (Signed)
Per patient request

## 2015-11-29 NOTE — Telephone Encounter (Signed)
Lab appointment notes updated to include A1C

## 2015-12-06 ENCOUNTER — Telehealth: Payer: Self-pay | Admitting: Family Medicine

## 2015-12-06 MED ORDER — BACLOFEN 10 MG PO TABS: ORAL_TABLET | ORAL | Status: DC

## 2015-12-06 NOTE — Telephone Encounter (Signed)
fyi

## 2015-12-06 NOTE — Telephone Encounter (Signed)
Patient Name: Taylor RocherLINDA Linville DOB: 07/27/1946 Initial Comment Caller states Wednesday she has had terrible muscle spasms in her back. She has darvocet that expired 2011 and wants to know if that is ok to take. Nurse Assessment Nurse: Yetta BarreJones, RN, Miranda Date/Time (Eastern Time): 12/06/2015 3:06:44 PM Confirm and document reason for call. If symptomatic, describe symptoms. ---Caller states she started having pain in middle and lower back on Wednesday. She feels it is a muscle spasm. Has the patient traveled out of the country within the last 30 days? ---Not Applicable Does the patient have any new or worsening symptoms? ---Yes Will a triage be completed? ---Yes Related visit to physician within the last 2 weeks? ---No Does the PT have any chronic conditions? (i.e. diabetes, asthma, etc.) ---Yes List chronic conditions. ---HTN Is this a behavioral health or substance abuse call? ---No Guidelines Guideline Title Affirmed Question Affirmed Notes Back Pain [1] MODERATE back pain (e.g., interferes with normal activities) AND [2] present > 3 days Final Disposition User See PCP When Office is Open (within 3 days) Yetta BarreJones, RN, Miranda Comments Caller wanted to know if she can take expired Darvocet. Told her she would need to contact the pharmacy about taking expired medication. Caller declined appt due to financial needs and would like to know if the doctor will call in Baclofen to help with the spasms. She states she cannot take Flexoril. Told caller I would make a note on the chart. Disagree/Comply: Disagree Disagree/Comply Reason: Disagree with instructions

## 2015-12-06 NOTE — Telephone Encounter (Signed)
Medication sent in for patient. She is aware of this and Dr. Mar DaringBurchettes annotations.

## 2015-12-06 NOTE — Telephone Encounter (Signed)
Would not use Darvocet if that out of date.  We could call in some limited Baclofen 10 mg po q 8 hours prn spasm #30 with no refills.  Follow up if no better by next week.

## 2015-12-06 NOTE — Telephone Encounter (Signed)
Pt is aware of annotations and medication has been sent into the pharmacy.

## 2015-12-06 NOTE — Telephone Encounter (Signed)
Ms. Taylor Gay called saying she had a back spasm on Wednesday that has not gone away. She said it hurts to sit, lay down, walk, stand, everything. She has a Darvacet that expired in 2011 and she's wondering if it would be ok to take it. I transferred her to the Triage nurse since we didn't have availability for today. Please call the pt if necessary.  Pt's ph# [phone] Thank you.

## 2015-12-17 ENCOUNTER — Other Ambulatory Visit (INDEPENDENT_AMBULATORY_CARE_PROVIDER_SITE_OTHER): Payer: Medicare Other

## 2015-12-17 DIAGNOSIS — Z0000 Encounter for general adult medical examination without abnormal findings: Secondary | ICD-10-CM

## 2015-12-17 DIAGNOSIS — I10 Essential (primary) hypertension: Secondary | ICD-10-CM | POA: Diagnosis not present

## 2015-12-17 LAB — BASIC METABOLIC PANEL
BUN: 26 mg/dL — ABNORMAL HIGH (ref 6–23)
CALCIUM: 9.8 mg/dL (ref 8.4–10.5)
CHLORIDE: 101 meq/L (ref 96–112)
CO2: 28 meq/L (ref 19–32)
Creatinine, Ser: 1.16 mg/dL (ref 0.40–1.20)
GFR: 49.4 mL/min — AB (ref >60.00)
Glucose, Bld: 118 mg/dL — ABNORMAL HIGH (ref 70–99)
POTASSIUM: 3.6 meq/L (ref 3.5–5.1)
SODIUM: 141 meq/L (ref 135–145)

## 2015-12-17 LAB — CBC WITH DIFFERENTIAL/PLATELET
BASOS ABS: 0.1 10*3/uL (ref 0.0–0.1)
Basophils Relative: 0.7 % (ref 0.0–3.0)
Eosinophils Absolute: 0.3 10*3/uL (ref 0.0–0.7)
Eosinophils Relative: 4.6 % (ref 0.0–5.0)
HCT: 43.4 % (ref 36.0–46.0)
HEMOGLOBIN: 14.4 g/dL (ref 12.0–15.0)
LYMPHS ABS: 1.6 10*3/uL (ref 0.7–4.0)
LYMPHS PCT: 21.6 % (ref 12.0–46.0)
MCHC: 33.2 g/dL (ref 30.0–36.0)
MCV: 86.3 fl (ref 78.0–100.0)
MONOS PCT: 9.8 % (ref 3.0–12.0)
Monocytes Absolute: 0.7 10*3/uL (ref 0.1–1.0)
NEUTROS PCT: 63.3 % (ref 43.0–77.0)
Neutro Abs: 4.7 10*3/uL (ref 1.4–7.7)
Platelets: 278 10*3/uL (ref 150.0–400.0)
RBC: 5.03 Mil/uL (ref 3.87–5.11)
RDW: 14.1 % (ref 11.5–15.5)
WBC: 7.4 10*3/uL (ref 4.0–10.5)

## 2015-12-17 LAB — LIPID PANEL
Cholesterol: 178 mg/dL (ref 0–200)
HDL: 54.7 mg/dL (ref >39.00)
LDL CALC: 96 mg/dL (ref 0–99)
NONHDL: 123.33
Total CHOL/HDL Ratio: 3
Triglycerides: 137 mg/dL (ref 0.0–149.0)
VLDL: 27.4 mg/dL (ref 0.0–40.0)

## 2015-12-17 LAB — HEPATIC FUNCTION PANEL
ALBUMIN: 4.3 g/dL (ref 3.5–5.2)
ALK PHOS: 113 U/L (ref 39–117)
ALT: 18 U/L (ref 0–35)
AST: 16 U/L (ref 0–37)
Bilirubin, Direct: 0 mg/dL (ref 0.0–0.3)
Total Bilirubin: 0.3 mg/dL (ref 0.2–1.2)
Total Protein: 7.7 g/dL (ref 6.0–8.3)

## 2015-12-17 LAB — HEMOGLOBIN A1C: HEMOGLOBIN A1C: 6.2 % (ref 4.6–6.5)

## 2015-12-17 LAB — TSH: TSH: 4.32 u[IU]/mL (ref 0.35–4.50)

## 2015-12-20 ENCOUNTER — Telehealth: Payer: Self-pay | Admitting: Family Medicine

## 2015-12-20 NOTE — Telephone Encounter (Signed)
Called and spoke with pt and pt states she cannot afford to go the UC or the ED.  Pt attempted to go UC and was told the copay was 50 dollars; pt states she only has 82 cents. Pt states she does not have any cardiac problems.  Pt wanted to wait until 12/23/2014 to see Dr. Caryl NeverBurchette for her physical. Since there was an ED/UC refusal I offered pt Saturday clinic.  Pt is aware of appt time.

## 2015-12-20 NOTE — Telephone Encounter (Signed)
TELEPHONE ADVICE RECORD [facility]  Patient Name: Taylor RocherLINDA Gay  DOB: 07/15/1946    Initial Comment Caller States shoulder blades are hurting. bad sore throat, watery eyes, cough, sneezing, congested   Nurse Assessment  Nurse: Odis LusterBowers, RN, Bjorn Loserhonda Date/Time Lamount Cohen(Eastern Time): 12/20/2015 12:12:15 PM  Confirm and document reason for call. If symptomatic, describe symptoms. ---Caller States shoulder blades are hurting. bad sore throat, watery eyes, cough, sneezing, congested. Reports deep body aches. Reports that he had a flu shot this year. Able to swallow liquids, no temp. Dry cough. Chest discomfort, nagging pain behind the shoulder blades. Symptoms began 1 week ago. Runny nose, drainage is clear.  Has the patient traveled out of the country within the last 30 days? ---No  Does the patient have any new or worsening symptoms? ---Yes  Will a triage be completed? ---Yes  Related visit to physician within the last 2 weeks? ---No  Does the PT have any chronic conditions? (i.e. diabetes, asthma, etc.) ---Yes  List chronic conditions. ---hypertension; arthritis, spinal stenosis  Is this a behavioral health or substance abuse call? ---No     Guidelines    Guideline Title Affirmed Question Affirmed Notes  Chest Pain [1] Chest pain lasts > 5 minutes AND [2] age > 4550    Final Disposition User   Call EMS 911 Now Odis LusterBowers, RN, Bjorn LoserRhonda    Comments  Caller refuses to go to ED due to financial reasons. [facility]   Referrals  GO TO FACILITY REFUSED   Disagree/Comply: Disagree  Disagree/Comply Reason: Disagree with instructions

## 2015-12-21 ENCOUNTER — Encounter: Payer: Self-pay | Admitting: Family Medicine

## 2015-12-21 ENCOUNTER — Ambulatory Visit (INDEPENDENT_AMBULATORY_CARE_PROVIDER_SITE_OTHER): Payer: Medicare Other | Admitting: Family Medicine

## 2015-12-21 VITALS — BP 122/70 | HR 64 | Temp 97.4°F | Wt 203.0 lb

## 2015-12-21 DIAGNOSIS — M549 Dorsalgia, unspecified: Secondary | ICD-10-CM

## 2015-12-21 DIAGNOSIS — J209 Acute bronchitis, unspecified: Secondary | ICD-10-CM | POA: Insufficient documentation

## 2015-12-21 DIAGNOSIS — M546 Pain in thoracic spine: Secondary | ICD-10-CM | POA: Insufficient documentation

## 2015-12-21 MED ORDER — GUAIFENESIN-CODEINE 100-10 MG/5ML PO SYRP: 5.0000 mL | ORAL_SOLUTION | Freq: Two times a day (BID) | ORAL | Status: DC | PRN

## 2015-12-21 MED ORDER — AZITHROMYCIN 250 MG PO TABS: ORAL_TABLET | ORAL | Status: DC

## 2015-12-21 NOTE — Addendum Note (Signed)
Addended by: Azucena FreedMILLNER, Sidney Silberman C on: 12/21/2015 12:17 PM   Modules accepted: Orders

## 2015-12-21 NOTE — Assessment & Plan Note (Signed)
Reproducible to palpation, point tender midline thoracic spine Has not had DEXA. Discussed possible concern for vertebral compression fracture, pt declines xray today requests recheck next week with PCP.

## 2015-12-21 NOTE — Assessment & Plan Note (Signed)
Given duration and progression of sxs, will treat with zpack antibiotic and cheratussin cough syrup. Update if not improving with treatment as expected. Recheck with PCP on tuesday (already scheduled)

## 2015-12-21 NOTE — Progress Notes (Signed)
BP 122/70 mmHg  Pulse 64  Temp(Src) 97.4 F (36.3 C) (Oral)  Wt 203 lb (92.08 kg)  SpO2 97%   CC: cough, ST  Subjective:    Patient ID: Taylor Gay, female    DOB: 07/04/1946, 69 y.o.   MRN: [id number]  HPI: [name] is a 69 y.o. female presenting on 12/21/2015 for Cough; Sore Throat; and Nasal Congestion   10d h/o ST, dry nonproductive cough, achy pain in shoulder blades, malaise. Somewhat feverish. Rhinorrhea has improved. Mild wheezing and dyspnea (chronic). Cough slowly improving.   No documented fevers/chills, ear or tooth pain, chest pain, PNrainage.   Has tried mucinex D which didn't help. Taking robitussin without improvement.   H/o PNA x2 - this feels like that.  Upcoming CPE.  Did receive flu shot this year. Pt states she has taken codeine and tolerated well in the past.  No smoker at home. Son and son's GF sick recently.  No h/o asthma.   Relevant past medical, surgical, family and social history reviewed and updated as indicated. Interim medical history since our last visit reviewed. Allergies and medications reviewed and updated. Current Outpatient Prescriptions on File Prior to Visit  Medication Sig  . baclofen (LIORESAL) 10 MG tablet Take 1 tablet (10 mg total) by mouth 3 (three) times daily. (Patient taking differently: Take 10 mg by mouth daily as needed for muscle spasms. )  . oxyCODONE (OXY IR/ROXICODONE) 5 MG immediate release tablet Take 5 mg by mouth daily as needed for moderate pain.   . ranitidine (ZANTAC) 300 MG tablet Take 1 tablet (300 mg total) by mouth 2 (two) times daily.  Marland Kitchen triamterene-hydrochlorothiazide (MAXZIDE) 75-50 MG tablet TAKE 1 TABLET EACH DAY.  Marland Kitchen loratadine (CLARITIN) 10 MG tablet Take 10 mg by mouth daily as needed for allergies. Reported on 12/21/2015  . VENTOLIN HFA 108 (90 BASE) MCG/ACT inhaler USE 2 PUFFS EVERY 6 HOURS AS NEEDED FOR WHEEZING. (Patient not taking: Reported on 12/21/2015)   No current  facility-administered medications on file prior to visit.    Review of Systems Per HPI unless specifically indicated in ROS section     Objective:    BP 122/70 mmHg  Pulse 64  Temp(Src) 97.4 F (36.3 C) (Oral)  Wt 203 lb (92.08 kg)  SpO2 97%  Wt Readings from Last 3 Encounters:  12/21/15 203 lb (92.08 kg)  10/15/15 201 lb (91.173 kg)  09/03/15 201 lb (91.173 kg)    Physical Exam  Constitutional: She appears well-developed and well-nourished. No distress.  HENT:  Head: Normocephalic and atraumatic.  Right Ear: Hearing, tympanic membrane, external ear and ear canal normal.  Left Ear: Hearing, tympanic membrane, external ear and ear canal normal.  Nose: Mucosal edema (mild) present. No rhinorrhea. Right sinus exhibits no maxillary sinus tenderness and no frontal sinus tenderness. Left sinus exhibits no maxillary sinus tenderness and no frontal sinus tenderness.  Mouth/Throat: Uvula is midline and mucous membranes are normal. Posterior oropharyngeal erythema (raw oropharynx) present. No oropharyngeal exudate, posterior oropharyngeal edema or tonsillar abscesses.  Eyes: Conjunctivae and EOM are normal. Pupils are equal, round, and reactive to light. No scleral icterus.  Neck: Normal range of motion. Neck supple.  Cardiovascular: Normal rate, regular rhythm, normal heart sounds and intact distal pulses.   No murmur heard. Pulmonary/Chest: Effort normal and breath sounds normal. No respiratory distress. She has no wheezes. She has no rales.  Lungs clear  Musculoskeletal:  Tender to palpation midline upper thoracic spine Mild  discomfort at rhomboids  Lymphadenopathy:    She has no cervical adenopathy.  Skin: Skin is warm and dry. No rash noted.  Nursing note and vitals reviewed.  Results for orders placed or performed in visit on 12/17/15  Basic metabolic panel  Result Value Ref Range   Sodium 141 135 - 145 mEq/L   Potassium 3.6 3.5 - 5.1 mEq/L   Chloride 101 96 - 112 mEq/L    CO2 28 19 - 32 mEq/L   Glucose, Bld 118 (H) 70 - 99 mg/dL   BUN 26 (H) 6 - 23 mg/dL   Creatinine, Ser 5.361.16 0.40 - 1.20 mg/dL   Calcium 9.8 8.4 - 64.410.5 mg/dL   GFR 03.4749.40 (L) >42.59>60.00 mL/min  CBC with Differential/Platelet  Result Value Ref Range   WBC 7.4 4.0 - 10.5 K/uL   RBC 5.03 3.87 - 5.11 Mil/uL   Hemoglobin 14.4 12.0 - 15.0 g/dL   HCT 56.343.4 87.536.0 - 64.346.0 %   MCV 86.3 78.0 - 100.0 fl   MCHC 33.2 30.0 - 36.0 g/dL   RDW 32.914.1 51.811.5 - 84.115.5 %   Platelets 278.0 150.0 - 400.0 K/uL   Neutrophils Relative % 63.3 43.0 - 77.0 %   Lymphocytes Relative 21.6 12.0 - 46.0 %   Monocytes Relative 9.8 3.0 - 12.0 %   Eosinophils Relative 4.6 0.0 - 5.0 %   Basophils Relative 0.7 0.0 - 3.0 %   Neutro Abs 4.7 1.4 - 7.7 K/uL   Lymphs Abs 1.6 0.7 - 4.0 K/uL   Monocytes Absolute 0.7 0.1 - 1.0 K/uL   Eosinophils Absolute 0.3 0.0 - 0.7 K/uL   Basophils Absolute 0.1 0.0 - 0.1 K/uL  Hepatic function panel  Result Value Ref Range   Total Bilirubin 0.3 0.2 - 1.2 mg/dL   Bilirubin, Direct 0.0 0.0 - 0.3 mg/dL   Alkaline Phosphatase 113 39 - 117 U/L   AST 16 0 - 37 U/L   ALT 18 0 - 35 U/L   Total Protein 7.7 6.0 - 8.3 g/dL   Albumin 4.3 3.5 - 5.2 g/dL  Lipid panel  Result Value Ref Range   Cholesterol 178 0 - 200 mg/dL   Triglycerides 660.6137.0 0.0 - 149.0 mg/dL   HDL 30.1654.70 >01.09>39.00 mg/dL   VLDL 32.327.4 0.0 - 55.740.0 mg/dL   LDL Cholesterol 96 0 - 99 mg/dL   Total CHOL/HDL Ratio 3    NonHDL 123.33   TSH  Result Value Ref Range   TSH 4.32 0.35 - 4.50 uIU/mL  Hemoglobin A1c  Result Value Ref Range   Hgb A1c MFr Bld 6.2 4.6 - 6.5 %      Assessment & Plan:   Problem List Items Addressed This Visit    Acute thoracic back pain    Reproducible to palpation, point tender midline thoracic spine Has not had DEXA. Discussed possible concern for vertebral compression fracture, pt declines xray today requests recheck next week with PCP.       Acute bronchitis - Primary    Given duration and progression of sxs, will  treat with zpack antibiotic and cheratussin cough syrup. Update if not improving with treatment as expected. Recheck with PCP on tuesday (already scheduled)          Follow up plan: Return if symptoms worsen or fail to improve.

## 2015-12-21 NOTE — Patient Instructions (Signed)
I think you have bronchitis.  Treat with zpack and codeine antibiotic. Have Dr Caryl NeverBurchette check your back when you see him on Tuesday. Push fluids and rest. Let us know if worsening instead of improving.

## 2015-12-21 NOTE — Progress Notes (Signed)
Pre visit review using our clinic review tool, if applicable. No additional management support is needed unless otherwise documented below in the visit note. 

## 2015-12-23 NOTE — Addendum Note (Signed)
Addended by: Janeen Watson on: 12/23/2015 09:54 AM   Modules accepted: SmartSet  

## 2015-12-24 ENCOUNTER — Ambulatory Visit (INDEPENDENT_AMBULATORY_CARE_PROVIDER_SITE_OTHER): Payer: Medicare Other | Admitting: Family Medicine

## 2015-12-24 ENCOUNTER — Ambulatory Visit (INDEPENDENT_AMBULATORY_CARE_PROVIDER_SITE_OTHER)
Admission: RE | Admit: 2015-12-24 | Discharge: 2015-12-24 | Disposition: A | Discharge status: Home / Self Care | Payer: Medicare Other | Source: Ambulatory Visit | Attending: Family Medicine | Admitting: Family Medicine

## 2015-12-24 ENCOUNTER — Ambulatory Visit: Payer: Medicare Other | Admitting: Family Medicine

## 2015-12-24 ENCOUNTER — Encounter: Payer: Self-pay | Admitting: Family Medicine

## 2015-12-24 VITALS — BP 112/78 | HR 78 | Temp 97.7°F | Ht 67.25 in | Wt 209.7 lb

## 2015-12-24 DIAGNOSIS — Z23 Encounter for immunization: Secondary | ICD-10-CM

## 2015-12-24 DIAGNOSIS — M898X1 Other specified disorders of bone, shoulder: Secondary | ICD-10-CM | POA: Diagnosis not present

## 2015-12-24 DIAGNOSIS — Z780 Asymptomatic menopausal state: Secondary | ICD-10-CM

## 2015-12-24 DIAGNOSIS — L819 Disorder of pigmentation, unspecified: Secondary | ICD-10-CM

## 2015-12-24 DIAGNOSIS — Z0000 Encounter for general adult medical examination without abnormal findings: Secondary | ICD-10-CM

## 2015-12-24 DIAGNOSIS — R5383 Other fatigue: Secondary | ICD-10-CM | POA: Diagnosis not present

## 2015-12-24 DIAGNOSIS — L719 Rosacea, unspecified: Secondary | ICD-10-CM

## 2015-12-24 DIAGNOSIS — Z0001 Encounter for general adult medical examination with abnormal findings: Secondary | ICD-10-CM

## 2015-12-24 MED ORDER — DOXYCYCLINE HYCLATE 100 MG PO CAPS: 100.0000 mg | ORAL_CAPSULE | Freq: Every day | ORAL | Status: DC

## 2015-12-24 NOTE — Progress Notes (Signed)
Pre visit review using our clinic review tool, if applicable. No additional management support is needed unless otherwise documented below in the visit note. 

## 2015-12-24 NOTE — Patient Instructions (Signed)
[facility] up [facility] in 8 AM to 5 PM
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
## 2015-12-27 ENCOUNTER — Other Ambulatory Visit: Payer: Self-pay | Admitting: Family Medicine

## 2015-12-27 DIAGNOSIS — R110 Nausea: Secondary | ICD-10-CM

## 2015-12-27 DIAGNOSIS — R109 Unspecified abdominal pain: Secondary | ICD-10-CM

## 2016-01-09 ENCOUNTER — Ambulatory Visit (HOSPITAL_COMMUNITY)
Admission: RE | Admit: 2016-01-09 | Discharge: 2016-01-09 | Disposition: A | Discharge status: Home / Self Care | Payer: Medicare Other | Source: Ambulatory Visit | Attending: Family Medicine | Admitting: Family Medicine

## 2016-01-09 DIAGNOSIS — R110 Nausea: Secondary | ICD-10-CM | POA: Diagnosis present

## 2016-01-09 DIAGNOSIS — R109 Unspecified abdominal pain: Secondary | ICD-10-CM | POA: Insufficient documentation

## 2016-01-09 MED ORDER — SINCALIDE 5 MCG IJ SOLR
0.0200 ug/kg | Freq: Once | INTRAMUSCULAR | Status: AC
  Administered 2016-01-09: 1.9 ug via INTRAVENOUS

## 2016-01-09 MED ORDER — TECHNETIUM TC 99M MEBROFENIN IV KIT
5.0000 | PACK | Freq: Once | INTRAVENOUS | Status: AC | PRN
  Administered 2016-01-09: 5 via INTRAVENOUS

## 2016-01-10 ENCOUNTER — Telehealth: Payer: Self-pay | Admitting: Family Medicine

## 2016-01-10 NOTE — Telephone Encounter (Signed)
Pt said md called  her yesterday and she would like to talk with him again today.

## 2016-01-24 DIAGNOSIS — D2239 Melanocytic nevi of other parts of face: Secondary | ICD-10-CM | POA: Diagnosis not present

## 2016-01-24 DIAGNOSIS — L719 Rosacea, unspecified: Secondary | ICD-10-CM | POA: Diagnosis not present

## 2016-01-24 DIAGNOSIS — D485 Neoplasm of uncertain behavior of skin: Secondary | ICD-10-CM | POA: Diagnosis not present

## 2016-02-11 ENCOUNTER — Other Ambulatory Visit: Payer: Self-pay | Admitting: Family Medicine

## 2016-03-02 ENCOUNTER — Ambulatory Visit (INDEPENDENT_AMBULATORY_CARE_PROVIDER_SITE_OTHER): Payer: Medicare Other | Admitting: Family Medicine

## 2016-03-02 VITALS — BP 140/100 | HR 84 | Temp 98.1°F | Ht 67.25 in | Wt 208.1 lb

## 2016-03-02 DIAGNOSIS — J029 Acute pharyngitis, unspecified: Secondary | ICD-10-CM

## 2016-03-02 LAB — POCT RAPID STREP A (OFFICE): RAPID STREP A SCREEN: NEGATIVE

## 2016-03-02 MED ORDER — BACLOFEN 10 MG PO TABS: 10.0000 mg | ORAL_TABLET | Freq: Every day | ORAL | Status: DC | PRN
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
## 2016-03-02 NOTE — Patient Instructions (Signed)

## 2016-03-02 NOTE — Progress Notes (Signed)
Pre visit review using our clinic review tool, if applicable. No additional management support is needed unless otherwise documented below in the visit note. 

## 2016-03-03 ENCOUNTER — Other Ambulatory Visit: Payer: Self-pay | Admitting: Physical Medicine and Rehabilitation

## 2016-03-03 DIAGNOSIS — M546 Pain in thoracic spine: Secondary | ICD-10-CM

## 2016-03-03 DIAGNOSIS — M5136 Other intervertebral disc degeneration, lumbar region: Secondary | ICD-10-CM

## 2016-03-03 DIAGNOSIS — M545 Low back pain: Secondary | ICD-10-CM | POA: Diagnosis not present

## 2016-03-03 DIAGNOSIS — M5416 Radiculopathy, lumbar region: Secondary | ICD-10-CM | POA: Diagnosis not present

## 2016-03-17 ENCOUNTER — Ambulatory Visit
Admission: RE | Admit: 2016-03-17 | Discharge: 2016-03-17 | Disposition: A | Discharge status: Home / Self Care | Payer: Medicare Other | Source: Ambulatory Visit | Attending: Physical Medicine and Rehabilitation | Admitting: Physical Medicine and Rehabilitation

## 2016-03-17 DIAGNOSIS — M51369 Other intervertebral disc degeneration, lumbar region without mention of lumbar back pain or lower extremity pain: Secondary | ICD-10-CM

## 2016-03-17 DIAGNOSIS — M5136 Other intervertebral disc degeneration, lumbar region: Secondary | ICD-10-CM

## 2016-03-17 DIAGNOSIS — M4806 Spinal stenosis, lumbar region: Secondary | ICD-10-CM | POA: Diagnosis not present

## 2016-03-17 DIAGNOSIS — M546 Pain in thoracic spine: Secondary | ICD-10-CM

## 2016-03-17 DIAGNOSIS — M5124 Other intervertebral disc displacement, thoracic region: Secondary | ICD-10-CM | POA: Diagnosis not present

## 2016-03-23 DIAGNOSIS — M791 Myalgia: Secondary | ICD-10-CM | POA: Diagnosis not present

## 2016-03-23 DIAGNOSIS — M545 Low back pain: Secondary | ICD-10-CM | POA: Diagnosis not present

## 2016-03-23 DIAGNOSIS — M5416 Radiculopathy, lumbar region: Secondary | ICD-10-CM | POA: Diagnosis not present

## 2016-03-25 ENCOUNTER — Ambulatory Visit: Payer: Medicare Other | Admitting: Family Medicine

## 2016-03-27 DIAGNOSIS — M545 Low back pain: Secondary | ICD-10-CM | POA: Diagnosis not present

## 2016-03-27 DIAGNOSIS — M5136 Other intervertebral disc degeneration, lumbar region: Secondary | ICD-10-CM | POA: Diagnosis not present

## 2016-03-27 DIAGNOSIS — M5416 Radiculopathy, lumbar region: Secondary | ICD-10-CM | POA: Diagnosis not present

## 2016-04-13 DIAGNOSIS — M545 Low back pain: Secondary | ICD-10-CM | POA: Diagnosis not present

## 2016-04-13 DIAGNOSIS — M5136 Other intervertebral disc degeneration, lumbar region: Secondary | ICD-10-CM | POA: Diagnosis not present

## 2016-04-13 DIAGNOSIS — M5416 Radiculopathy, lumbar region: Secondary | ICD-10-CM | POA: Diagnosis not present

## 2016-04-15 ENCOUNTER — Ambulatory Visit (INDEPENDENT_AMBULATORY_CARE_PROVIDER_SITE_OTHER): Payer: Medicare Other | Admitting: Family Medicine

## 2016-04-15 VITALS — BP 120/78 | HR 94 | Temp 97.9°F | Ht 67.25 in | Wt 214.0 lb

## 2016-04-15 DIAGNOSIS — S0121XA Laceration without foreign body of nose, initial encounter: Secondary | ICD-10-CM | POA: Diagnosis not present

## 2016-04-15 NOTE — Patient Instructions (Signed)
Keep wound dry for the first 24 hours then clean daily with soap and water for one week. Apply topical antibiotic daily for 3-4 days. Follow up promptly for any signs of infection such as redness, warmth, pain, or drainage. Follow up in 5 days for suture removal.
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
## 2016-04-15 NOTE — Progress Notes (Signed)
Pre visit review using our clinic review tool, if applicable. No additional management support is needed unless otherwise documented below in the visit note. 

## 2016-04-21 ENCOUNTER — Encounter: Payer: Self-pay | Admitting: Family Medicine

## 2016-04-21 ENCOUNTER — Ambulatory Visit (INDEPENDENT_AMBULATORY_CARE_PROVIDER_SITE_OTHER): Payer: Medicare Other | Admitting: Family Medicine

## 2016-04-21 VITALS — BP 104/80 | HR 81 | Temp 97.8°F | Ht 67.25 in | Wt 214.7 lb

## 2016-04-21 DIAGNOSIS — I10 Essential (primary) hypertension: Secondary | ICD-10-CM

## 2016-04-21 DIAGNOSIS — R5383 Other fatigue: Secondary | ICD-10-CM

## 2016-04-21 DIAGNOSIS — S0121XD Laceration without foreign body of nose, subsequent encounter: Secondary | ICD-10-CM | POA: Diagnosis not present

## 2016-04-21 LAB — TSH: TSH: 1.61 u[IU]/mL (ref 0.35–4.50)

## 2016-04-21 MED ORDER — MUPIROCIN 2 % EX OINT: 1.0000 "application " | TOPICAL_OINTMENT | Freq: Two times a day (BID) | CUTANEOUS | Status: DC

## 2016-04-21 NOTE — Patient Instructions (Signed)
Keep some bactroban on wound for the next week.

## 2016-04-21 NOTE — Progress Notes (Signed)
Pre visit review using our clinic review tool, if applicable. No additional management support is needed unless otherwise documented below in the visit note. 
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
## 2016-04-22 ENCOUNTER — Other Ambulatory Visit: Payer: Medicare Other

## 2016-05-15 ENCOUNTER — Ambulatory Visit (INDEPENDENT_AMBULATORY_CARE_PROVIDER_SITE_OTHER): Payer: Medicare Other | Admitting: Family Medicine

## 2016-05-15 ENCOUNTER — Encounter: Payer: Self-pay | Admitting: Family Medicine

## 2016-05-15 VITALS — BP 120/76 | HR 78 | Temp 97.9°F | Resp 20 | Wt 212.5 lb

## 2016-05-15 DIAGNOSIS — S0121XD Laceration without foreign body of nose, subsequent encounter: Secondary | ICD-10-CM | POA: Diagnosis not present

## 2016-05-15 DIAGNOSIS — L309 Dermatitis, unspecified: Secondary | ICD-10-CM | POA: Diagnosis not present

## 2016-05-15 MED ORDER — TRIAMCINOLONE ACETONIDE 0.1 % EX CREA: 1.0000 "application " | TOPICAL_CREAM | Freq: Two times a day (BID) | CUTANEOUS | Status: DC | PRN

## 2016-05-15 NOTE — Patient Instructions (Signed)
Leave off hydrogen peroxide and Bactroban from skin rash May use Triamcinolone twice daily as needed.
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
## 2016-05-19 DIAGNOSIS — M545 Low back pain: Secondary | ICD-10-CM | POA: Diagnosis not present

## 2016-05-19 DIAGNOSIS — M5416 Radiculopathy, lumbar region: Secondary | ICD-10-CM | POA: Diagnosis not present

## 2016-05-19 DIAGNOSIS — M791 Myalgia: Secondary | ICD-10-CM | POA: Diagnosis not present

## 2016-05-19 DIAGNOSIS — M5136 Other intervertebral disc degeneration, lumbar region: Secondary | ICD-10-CM | POA: Diagnosis not present

## 2016-05-28 DIAGNOSIS — M545 Low back pain: Secondary | ICD-10-CM | POA: Diagnosis not present

## 2016-05-28 DIAGNOSIS — M5136 Other intervertebral disc degeneration, lumbar region: Secondary | ICD-10-CM | POA: Diagnosis not present

## 2016-05-28 DIAGNOSIS — M5416 Radiculopathy, lumbar region: Secondary | ICD-10-CM | POA: Diagnosis not present

## 2016-06-05 ENCOUNTER — Ambulatory Visit (INDEPENDENT_AMBULATORY_CARE_PROVIDER_SITE_OTHER): Payer: Medicare Other | Admitting: Family Medicine

## 2016-06-05 ENCOUNTER — Encounter: Payer: Self-pay | Admitting: Family Medicine

## 2016-06-05 VITALS — BP 144/96 | HR 99 | Temp 98.0°F | Ht 67.25 in | Wt 213.4 lb

## 2016-06-05 DIAGNOSIS — M79671 Pain in right foot: Secondary | ICD-10-CM

## 2016-06-05 NOTE — Progress Notes (Signed)
Pre visit review using our clinic review tool, if applicable. No additional management support is needed unless otherwise documented below in the visit note. 
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
## 2016-06-05 NOTE — Patient Instructions (Signed)
Keep clean with soap and water Follow up immediately for any signs of infections such as redness or swelling If not better by next week let me know and would consider X-ray to rule out foreign body.

## 2016-06-15 DIAGNOSIS — M7062 Trochanteric bursitis, left hip: Secondary | ICD-10-CM | POA: Diagnosis not present

## 2016-06-15 DIAGNOSIS — M7061 Trochanteric bursitis, right hip: Secondary | ICD-10-CM | POA: Diagnosis not present

## 2016-06-15 DIAGNOSIS — M791 Myalgia: Secondary | ICD-10-CM | POA: Diagnosis not present

## 2016-06-15 DIAGNOSIS — M545 Low back pain: Secondary | ICD-10-CM | POA: Diagnosis not present

## 2016-06-19 DIAGNOSIS — Z803 Family history of malignant neoplasm of breast: Secondary | ICD-10-CM | POA: Diagnosis not present

## 2016-06-19 DIAGNOSIS — Z1231 Encounter for screening mammogram for malignant neoplasm of breast: Secondary | ICD-10-CM | POA: Diagnosis not present

## 2016-06-19 LAB — HM MAMMOGRAPHY: HM MAMMO: ABNORMAL — AB (ref 0–4)

## 2016-06-24 DIAGNOSIS — N6001 Solitary cyst of right breast: Secondary | ICD-10-CM | POA: Diagnosis not present

## 2016-06-24 DIAGNOSIS — N63 Unspecified lump in breast: Secondary | ICD-10-CM | POA: Diagnosis not present

## 2016-06-24 LAB — HM MAMMOGRAPHY: HM MAMMO: ABNORMAL — AB (ref 0–4)

## 2016-06-25 ENCOUNTER — Encounter: Payer: Self-pay | Admitting: Family Medicine

## 2016-06-26 ENCOUNTER — Ambulatory Visit (INDEPENDENT_AMBULATORY_CARE_PROVIDER_SITE_OTHER): Payer: Medicare Other

## 2016-06-26 ENCOUNTER — Telehealth: Payer: Self-pay

## 2016-06-26 VITALS — BP 144/90 | HR 75 | Ht 68.0 in | Wt 215.6 lb

## 2016-06-26 DIAGNOSIS — Z0000 Encounter for general adult medical examination without abnormal findings: Secondary | ICD-10-CM

## 2016-06-26 NOTE — Telephone Encounter (Signed)
Ms. Renaldo Fiddlerdkins states her eczema or otherwise skin issue is near her left eye and is becoming more generalized;  Is seeking referral to Dermatologist; mentioned Dr. Emily FilbertGould on [address]; has not been to a dermatologist; Is requested referral to Dermatologist but explained if her apt was soon, that you would most likely see her first.   Also was noted to have 13 depression score; Felt lexapro helped in the past and made apt with you on Tuesday 07/11.,

## 2016-06-26 NOTE — Patient Instructions (Addendum)
Taylor Gay , Thank you for taking time to come for your Medicare Wellness Visit. I appreciate your ongoing commitment to your health goals. Please review the following plan we discussed and let me know if I can assist you in the future.   Call [facility] for apt to fup on blurry vision;   Will discuss Pain management with Orthopedic; Percell Miller and Noemi Chapel)   Will fup with Dr. Elease Hashimoto in regard to depressive symptoms; may be more pain related  Will seed referral to dermatologist;   Keep [name] posted on you mammogram reports  Try energy conservation and take care of yourself  Look online for bathing suit or swimwear that will work for you if Dermatology approves   These are the goals we discussed: Goals    . pain control     To get pain better managed with med you can tolerate Consider pain clinic if doctor would agree  Water; post dermatology review       This is a list of the screening recommended for you and due dates:  Health Maintenance  Topic Date Due  .  Hepatitis C: One time screening is recommended by Center for Disease Control  (CDC) for  adults born from [birth year] through 1963.   12/23/2025*  . Flu Shot  07/21/2016  . Mammogram  06/24/2018  . Colon Cancer Screening  10/14/2020  . Tetanus Vaccine  12/23/2025  . DEXA scan (bone density measurement)  Completed  . Shingles Vaccine  Completed  . Pneumonia vaccines  Completed  *Topic was postponed. The date shown is not the original due date.    How to Avoid Diabetes Problems You can do a lot to prevent or slow down diabetes problems. Following your diabetes plan and taking care of yourself can reduce your risk of serious or life-threatening complications. Below, you will find certain things you can do to prevent diabetes problems. MANAGE YOUR DIABETES Follow your health care provider's, nurse educator's, and dietitian's instructions for managing your diabetes. They will teach you the basics of diabetes care. They can  help answer questions you may have. Learn about diabetes and make healthy choices regarding eating and physical activity. Monitor your blood glucose level regularly. Your health care provider will help you decide how often to check your blood glucose level depending on your treatment goals and how well you are meeting them.  DO NOT USE NICOTINE Nicotine and diabetes are a dangerous combination. Nicotine raises your risk for diabetes problems. If you quit using nicotine, you will lower your risk for heart attack, stroke, nerve disease, and kidney disease. Your cholesterol and your blood pressure levels may improve. Your blood circulation will also improve. Do not use any tobacco products, including cigarettes, chewing tobacco, or electronic cigarettes. If you need help quitting, ask your health care provider. KEEP YOUR BLOOD PRESSURE UNDER CONTROL Your health care provider will determine your individualized target blood pressure based on your age, your medicines, how long you have had diabetes, and any other medical conditions you have. Blood pressure consists of two numbers. Generally, the goal is to keep your top number (systolic pressure) at or below 130, and your bottom number (diastolic pressure) at or below 80. Your health care provider may recommend a lower target blood pressure reading, if appropriate. Meal planning, medicines, and exercise can help you reach your target blood pressure. Make sure your health care provider checks your blood pressure at every visit. KEEP YOUR CHOLESTEROL UNDER CONTROL Normal cholesterol levels will  help prevent heart disease and stroke. These are the biggest health problems for people with diabetes. Keeping cholesterol levels under control can also help with blood flow. Have your cholesterol level checked at least once a year. Your health care provider may prescribe a medicine known as a statin. Statins lower your cholesterol. If you are not taking a statin, ask your  health care provider if you should be. Meal planning, exercise, and medicines can help you reach your cholesterol targets.  SCHEDULE AND KEEP YOUR ANNUAL PHYSICAL EXAMS AND EYE EXAMS Your health care provider will tell you how often he or she wants to see you depending on your plan of treatment. It is important that you keep these appointments so that possible problems can be identified early and complications can be avoided or treated.  Every visit with your health care provider should include your weight, blood pressure, and an evaluation of your blood glucose control.  Your hemoglobin A1c should be checked:  At least twice a year if you are at your goal.  Every 3 months if there are changes in treatment.  If you are not meeting your goals.  Your blood lipids should be checked yearly. You should also be checked yearly to see if you have protein in your urine (microalbumin).  Schedule a dilated eye exam within 5 years of your diagnosis if you have type 1 diabetes, and then yearly. Schedule a dilated eye exam at diagnosis if you have type 2 diabetes, and then yearly. All exams thereafter can be extended to every 2 to 3 years if one or more exams have been normal. KEEP YOUR VACCINES CURRENT It is recommended that you receive a flu (influenza) vaccine every year. It is also recommended that you receive a pneumonia (pneumococcal) vaccine. If you are 86 years of age or older and have never received a pneumonia vaccine, this vaccine may be given as a series of two separate shots. Ask your health care provider which additional vaccines may be recommended. TAKE CARE OF YOUR FEET  Diabetes may cause you to have a poor blood supply (circulation) to your legs and feet. Because of this, the skin may be thinner, break easier, and heal more slowly. You also may have nerve damage in your legs and feet, causing decreased feeling. You may not notice minor injuries to your feet that could lead to serious problems  or infections. Taking care of your feet is very important. Visual foot exams are performed at every routine medical visit. The exams check for cuts, injuries, or other problems with the feet. A comprehensive foot exam should be done yearly. This includes visual inspection as well as assessing foot pulses and testing for loss of sensation. You should also do the following:  Inspect your feet daily for cuts, calluses, blisters, ingrown toenails, and signs of infection, such as redness, swelling, or pus.  Wash and dry your feet thoroughly, especially between the toes.  Avoid soaking your feet regularly in hot water baths.  Moisturize dry skin with lotion, avoiding areas between your toes.  Cut toenails straight across and file the edges.  Avoid shoes that do not fit well or have areas that irritate your skin.  Avoid going barefooted or wearing only socks. Your feet need protection. TAKE CARE OF YOUR TEETH People with poorly controlled diabetes are more likely to have gum (periodontal) disease. These infections make diabetes harder to control. Periodontal diseases, if left untreated, can lead to tooth loss. Brush your teeth twice  a day, floss, and see your dentist for checkups and cleaning every 6 months, or 2 times a year. ASK YOUR HEALTH CARE PROVIDER ABOUT TAKING ASPIRIN Taking aspirin daily is recommended to help prevent cardiovascular disease in people with and without diabetes. Ask your health care provider if this would benefit you and what dose he or she would recommend. DRINK RESPONSIBLY Moderate amounts of alcohol (less than 1 drink per day for adult women and less than 2 drinks per day for adult men) have a minimal effect on blood glucose if ingested with food. It is important to eat food with alcohol to avoid hypoglycemia. People should avoid alcohol if they have a history of alcohol abuse or dependence, if they are pregnant, and if they have liver disease, pancreatitis, advanced  neuropathy, or severe hypertriglyceridemia. LESSEN STRESS Living with diabetes can be stressful. When you are under stress, your blood glucose may be affected in two ways:  Stress hormones may cause your blood glucose to rise.  You may be distracted from taking good care of yourself. It is a good idea to be aware of your stress level and make changes that are necessary to help you better manage challenging situations. Support groups, planned relaxation, a hobby you enjoy, meditation, healthy relationships, and exercise all work to lower your stress level. If your efforts do not seem to be helping, get help from your health care provider or a trained mental health professional.   This information is not intended to replace advice given to you by your health care provider. Make sure you discuss any questions you have with your health care provider.   Document Released: 08/25/2011 Document Revised: 12/28/2014 Document Reviewed: 01/31/2014 Elsevier Interactive Patient Education 2016 [place] Prevention Pre diabetes: Losing a modest 7 to 8 lbs; If over 200 lbs; 10 to 14 lbs;  Choose healthier foods; colorful veggies; fish or lean meats; drinks water Reduce portion size Start exercising; 30 minutes of fast walking x 30 minutes per day/ 60 min for weight loss      Screening for Type 2 Diabetes Screening is a way to check for type 2 diabetes in people who do not have symptoms of the disease, but who may likely develop diabetes in the future. Diabetes can lead to serious health problems, but finding diabetes early allows for early treatment. DIABETES RISK FACTORS   Family history of diabetes.  Diseases of the pancreas.  Obesity or being overweight.  Certain racial or ethnic groups:  American [place].  Pacific Islander.  Hispanic.  Asian.  African American.  High blood pressure (hypertension).  History of diabetes while pregnant (gestational  diabetes).  Delivering a baby that weighed over 9 pounds.  Being inactive.  High cholesterol or triglycerides.  Age, especially over 57 years of age.  Other diseases or conditions.  Diseases of the pancreas.  Cardiovascular disease.  Disorders of the endocrine system.  Certain medicines, such as those that treat high blood cholesterol levels. WHO IS SCREENED Adults  Adults who have no risk factors and no symptoms should be screened starting at age 49. If the screening tests are normal, they should be repeated every 3 years.  Adults who do not have symptoms, but have 1 or more risk factors, should be screened.  Adults who have 2 or more risk factors may be screened every year.  Adults who have an A1c (3 month average of blood glucose) greater than 5.7% or who had an impaired glucose tolerance (IGT)  or impaired fasting glucose (IFG) on a previous test should be screened.  Pregnant women who have risk factors should be screened at their first prenatal visit.  Women who have given birth and had gestational diabetes should be screened 6-12 weeks after the child is born. This screening should be repeated every 1-3 years after the first test. Children or Adolescents  Children and adolescents should be screened for type 2 diabetes if they are overweight and have 2 of the following risk factors:  Having a family history of type 2 diabetes.  Being a member of a high risk race or ethnic group.  Having signs of insulin resistance or conditions associated with insulin resistance.  Having a mother who had gestational diabetes while pregnant with him or her.  Screening should start at age 14 or at the onset of puberty, whichever comes first. This should be repeated every 2 years. SCREENING In a screening, your caregiver may:  Ask questions about your overall health. This will include questions about the health of close family members, too.  Ask about any diabetes-like symptoms you  may have.  Perform a physical exam.  Order some tests that may include:  A fasting plasma glucose test. This measures the level of glucose in your blood. It is done after you have had nothing to eat but water (fasted) for 8 hours.  A random blood glucose test. This test is done without the need to fast.  An oral glucose tolerance test. This is a blood test done in 2 parts. First, a blood sample is taken after you have fasted. Then, another sample is taken after you drink a liquid that contains a lot of sugar.  An A1c test. This test shows how much glucose has been in your blood over the past 2 to 3 months.   This information is not intended to replace advice given to you by your health care provider. Make sure you discuss any questions you have with your health care provider.   Document Released: 10/03/2009 Document Revised: 12/28/2014 Document Reviewed: 07/15/2011 Elsevier Interactive Patient Education 2016 [place] in the Home  Falls can cause injuries. They can happen to people of all ages. There are many things you can do to make your home safe and to help prevent falls.  WHAT CAN I DO ON THE OUTSIDE OF MY HOME?  Regularly fix the edges of walkways and driveways and fix any cracks.  Remove anything that might make you trip as you walk through a door, such as a raised step or threshold.  Trim any bushes or trees on the path to your home.  Use bright outdoor lighting.  Clear any walking paths of anything that might make someone trip, such as rocks or tools.  Regularly check to see if handrails are loose or broken. Make sure that both sides of any steps have handrails.  Any raised decks and porches should have guardrails on the edges.  Have any leaves, snow, or ice cleared regularly.  Use sand or salt on walking paths during winter.  Clean up any spills in your garage right away. This includes oil or grease spills. WHAT CAN I DO IN THE BATHROOM?    Use night lights.  Install grab bars by the toilet and in the tub and shower. Do not use towel bars as grab bars.  Use non-skid mats or decals in the tub or shower.  If you need to sit down in the shower, use  a plastic, non-slip stool.  Keep the floor dry. Clean up any water that spills on the floor as soon as it happens.  Remove soap buildup in the tub or shower regularly.  Attach bath mats securely with double-sided non-slip rug tape.  Do not have throw rugs and other things on the floor that can make you trip. WHAT CAN I DO IN THE BEDROOM?  Use night lights.  Make sure that you have a light by your bed that is easy to reach.  Do not use any sheets or blankets that are too big for your bed. They should not hang down onto the floor.  Have a firm chair that has side arms. You can use this for support while you get dressed.  Do not have throw rugs and other things on the floor that can make you trip. WHAT CAN I DO IN THE KITCHEN?  Clean up any spills right away.  Avoid walking on wet floors.  Keep items that you use a lot in easy-to-reach places.  If you need to reach something above you, use a strong step stool that has a grab bar.  Keep electrical cords out of the way.  Do not use floor polish or wax that makes floors slippery. If you must use wax, use non-skid floor wax.  Do not have throw rugs and other things on the floor that can make you trip. WHAT CAN I DO WITH MY STAIRS?  Do not leave any items on the stairs.  Make sure that there are handrails on both sides of the stairs and use them. Fix handrails that are broken or loose. Make sure that handrails are as long as the stairways.  Check any carpeting to make sure that it is firmly attached to the stairs. Fix any carpet that is loose or worn.  Avoid having throw rugs at the top or bottom of the stairs. If you do have throw rugs, attach them to the floor with carpet tape.  Make sure that you have a light  switch at the top of the stairs and the bottom of the stairs. If you do not have them, ask someone to add them for you. WHAT ELSE CAN I DO TO HELP PREVENT FALLS?  Wear shoes that:  Do not have high heels.  Have rubber bottoms.  Are comfortable and fit you well.  Are closed at the toe. Do not wear sandals.  If you use a stepladder:  Make sure that it is fully opened. Do not climb a closed stepladder.  Make sure that both sides of the stepladder are locked into place.  Ask someone to hold it for you, if possible.  Clearly mark and make sure that you can see:  Any grab bars or handrails.  First and last steps.  Where the edge of each step is.  Use tools that help you move around (mobility aids) if they are needed. These include:  Canes.  Walkers.  Scooters.  Crutches.  Turn on the lights when you go into a dark area. Replace any light bulbs as soon as they burn out.  Set up your furniture so you have a clear path. Avoid moving your furniture around.  If any of your floors are uneven, fix them.  If there are any pets around you, be aware of where they are.  Review your medicines with your doctor. Some medicines can make you feel dizzy. This can increase your chance of falling. Ask your doctor what other  things that you can do to help prevent falls.   This information is not intended to replace advice given to you by your health care provider. Make sure you discuss any questions you have with your health care provider.   Document Released: 10/03/2009 Document Revised: 04/23/2015 Document Reviewed: 01/11/2015 Elsevier Interactive Patient Education 2016 [place] Maintenance, Female Adopting a healthy lifestyle and getting preventive care can go a long way to promote health and wellness. Talk with your health care provider about what schedule of regular examinations is right for you. This is a good chance for you to check in with your provider about  disease prevention and staying healthy. In between checkups, there are plenty of things you can do on your own. Experts have done a lot of research about which lifestyle changes and preventive measures are most likely to keep you healthy. Ask your health care provider for more information. WEIGHT AND DIET  Eat a healthy diet  Be sure to include plenty of vegetables, fruits, low-fat dairy products, and lean protein.  Do not eat a lot of foods high in solid fats, added sugars, or salt.  Get regular exercise. This is one of the most important things you can do for your health.  Most adults should exercise for at least 150 minutes each week. The exercise should increase your heart rate and make you sweat (moderate-intensity exercise).  Most adults should also do strengthening exercises at least twice a week. This is in addition to the moderate-intensity exercise.  Maintain a healthy weight  Body mass index (BMI) is a measurement that can be used to identify possible weight problems. It estimates body fat based on height and weight. Your health care provider can help determine your BMI and help you achieve or maintain a healthy weight.  For females 61 years of age and older:   A BMI below 18.5 is considered underweight.  A BMI of 18.5 to 24.9 is normal.  A BMI of 25 to 29.9 is considered overweight.  A BMI of 30 and above is considered obese.  Watch levels of cholesterol and blood lipids  You should start having your blood tested for lipids and cholesterol at 68 years of age, then have this test every 5 years.  You may need to have your cholesterol levels checked more often if:  Your lipid or cholesterol levels are high.  You are older than 68 years of age.  You are at high risk for heart disease.  CANCER SCREENING   Lung Cancer  Lung cancer screening is recommended for adults 38-87 years old who are at high risk for lung cancer because of a history of smoking.  A yearly  low-dose CT scan of the lungs is recommended for people who:  Currently smoke.  Have quit within the past 15 years.  Have at least a 30-pack-year history of smoking. A pack year is smoking an average of one pack of cigarettes a day for 1 year.  Yearly screening should continue until it has been 15 years since you quit.  Yearly screening should stop if you develop a health problem that would prevent you from having lung cancer treatment.  Breast Cancer  Practice breast self-awareness. This means understanding how your breasts normally appear and feel.  It also means doing regular breast self-exams. Let your health care provider know about any changes, no matter how small.  If you are in your 20s or 30s, you should have a clinical breast  exam (CBE) by a health care provider every 1-3 years as part of a regular health exam.  If you are 17 or older, have a CBE every year. Also consider having a breast X-ray (mammogram) every year.  If you have a family history of breast cancer, talk to your health care provider about genetic screening.  If you are at high risk for breast cancer, talk to your health care provider about having an MRI and a mammogram every year.  Breast cancer gene (BRCA) assessment is recommended for women who have family members with BRCA-related cancers. BRCA-related cancers include:  Breast.  Ovarian.  Tubal.  Peritoneal cancers.  Results of the assessment will determine the need for genetic counseling and BRCA1 and BRCA2 testing. Cervical Cancer Your health care provider may recommend that you be screened regularly for cancer of the pelvic organs (ovaries, uterus, and vagina). This screening involves a pelvic examination, including checking for microscopic changes to the surface of your cervix (Pap test). You may be encouraged to have this screening done every 3 years, beginning at age 53.  For women ages 50-65, health care providers may recommend pelvic exams  and Pap testing every 3 years, or they may recommend the Pap and pelvic exam, combined with testing for human papilloma virus (HPV), every 5 years. Some types of HPV increase your risk of cervical cancer. Testing for HPV may also be done on women of any age with unclear Pap test results.  Other health care providers may not recommend any screening for nonpregnant women who are considered low risk for pelvic cancer and who do not have symptoms. Ask your health care provider if a screening pelvic exam is right for you.  If you have had past treatment for cervical cancer or a condition that could lead to cancer, you need Pap tests and screening for cancer for at least 20 years after your treatment. If Pap tests have been discontinued, your risk factors (such as having a new sexual partner) need to be reassessed to determine if screening should resume. Some women have medical problems that increase the chance of getting cervical cancer. In these cases, your health care provider may recommend more frequent screening and Pap tests. Colorectal Cancer  This type of cancer can be detected and often prevented.  Routine colorectal cancer screening usually begins at 68 years of age and continues through 68 years of age.  Your health care provider may recommend screening at an earlier age if you have risk factors for colon cancer.  Your health care provider may also recommend using home test kits to check for hidden blood in the stool.  A small camera at the end of a tube can be used to examine your colon directly (sigmoidoscopy or colonoscopy). This is done to check for the earliest forms of colorectal cancer.  Routine screening usually begins at age 57.  Direct examination of the colon should be repeated every 5-10 years through 68 years of age. However, you may need to be screened more often if early forms of precancerous polyps or small growths are found. Skin Cancer  Check your skin from head to toe  regularly.  Tell your health care provider about any new moles or changes in moles, especially if there is a change in a mole's shape or color.  Also tell your health care provider if you have a mole that is larger than the size of a pencil eraser.  Always use sunscreen. Apply sunscreen liberally and repeatedly  throughout the day.  Protect yourself by wearing long sleeves, pants, a wide-brimmed hat, and sunglasses whenever you are outside. HEART DISEASE, DIABETES, AND HIGH BLOOD PRESSURE   High blood pressure causes heart disease and increases the risk of stroke. High blood pressure is more likely to develop in:  People who have blood pressure in the high end of the normal range (130-139/85-89 mm Hg).  People who are overweight or obese.  People who are African American.  If you are 8-40 years of age, have your blood pressure checked every 3-5 years. If you are 69 years of age or older, have your blood pressure checked every year. You should have your blood pressure measured twice--once when you are at a hospital or clinic, and once when you are not at a hospital or clinic. Record the average of the two measurements. To check your blood pressure when you are not at a hospital or clinic, you can use:  An automated blood pressure machine at a pharmacy.  A home blood pressure monitor.  If you are between 4 years and 44 years old, ask your health care provider if you should take aspirin to prevent strokes.  Have regular diabetes screenings. This involves taking a blood sample to check your fasting blood sugar level.  If you are at a normal weight and have a low risk for diabetes, have this test once every three years after 68 years of age.  If you are overweight and have a high risk for diabetes, consider being tested at a younger age or more often. PREVENTING INFECTION  Hepatitis B  If you have a higher risk for hepatitis B, you should be screened for this virus. You are considered  at high risk for hepatitis B if:  You were born in a country where hepatitis B is common. Ask your health care provider which countries are considered high risk.  Your parents were born in a high-risk country, and you have not been immunized against hepatitis B (hepatitis B vaccine).  You have HIV or AIDS.  You use needles to inject street drugs.  You live with someone who has hepatitis B.  You have had sex with someone who has hepatitis B.  You get hemodialysis treatment.  You take certain medicines for conditions, including cancer, organ transplantation, and autoimmune conditions. Hepatitis C  Blood testing is recommended for:  Everyone born from [birth year] through 1963.  Anyone with known risk factors for hepatitis C. Sexually transmitted infections (STIs)  You should be screened for sexually transmitted infections (STIs) including gonorrhea and chlamydia if:  You are sexually active and are younger than 68 years of age.  You are older than 68 years of age and your health care provider tells you that you are at risk for this type of infection.  Your sexual activity has changed since you were last screened and you are at an increased risk for chlamydia or gonorrhea. Ask your health care provider if you are at risk.  If you do not have HIV, but are at risk, it may be recommended that you take a prescription medicine daily to prevent HIV infection. This is called pre-exposure prophylaxis (PrEP). You are considered at risk if:  You are sexually active and do not regularly use condoms or know the HIV status of your partner(s).  You take drugs by injection.  You are sexually active with a partner who has HIV. Talk with your health care provider about whether you are at high risk  of being infected with HIV. If you choose to begin PrEP, you should first be tested for HIV. You should then be tested every 3 months for as long as you are taking PrEP.  PREGNANCY   If you are  premenopausal and you may become pregnant, ask your health care provider about preconception counseling.  If you may become pregnant, take 400 to 800 micrograms (mcg) of folic acid every day.  If you want to prevent pregnancy, talk to your health care provider about birth control (contraception). OSTEOPOROSIS AND MENOPAUSE   Osteoporosis is a disease in which the bones lose minerals and strength with aging. This can result in serious bone fractures. Your risk for osteoporosis can be identified using a bone density scan.  If you are 59 years of age or older, or if you are at risk for osteoporosis and fractures, ask your health care provider if you should be screened.  Ask your health care provider whether you should take a calcium or vitamin D supplement to lower your risk for osteoporosis.  Menopause may have certain physical symptoms and risks.  Hormone replacement therapy may reduce some of these symptoms and risks. Talk to your health care provider about whether hormone replacement therapy is right for you.  HOME CARE INSTRUCTIONS   Schedule regular health, dental, and eye exams.  Stay current with your immunizations.   Do not use any tobacco products including cigarettes, chewing tobacco, or electronic cigarettes.  If you are pregnant, do not drink alcohol.  If you are breastfeeding, limit how much and how often you drink alcohol.  Limit alcohol intake to no more than 1 drink per day for nonpregnant women. One drink equals 12 ounces of beer, 5 ounces of wine, or 1 ounces of hard liquor.  Do not use street drugs.  Do not share needles.  Ask your health care provider for help if you need support or information about quitting drugs.  Tell your health care provider if you often feel depressed.  Tell your health care provider if you have ever been abused or do not feel safe at home.   This information is not intended to replace advice given to you by your health care  provider. Make sure you discuss any questions you have with your health care provider.   Document Released: 06/22/2011 Document Revised: 12/28/2014 Document Reviewed: 11/08/2013 Elsevier Interactive Patient Education 2016 [employer].  Hearing Loss Hearing loss is a partial or total loss of the ability to hear. This can be temporary or permanent, and it can happen in one or both ears. Hearing loss may be referred to as deafness. Medical care is necessary to treat hearing loss properly and to prevent the condition from getting worse. Your hearing may partially or completely come back, depending on what caused your hearing loss and how severe it is. In some cases, hearing loss is permanent. CAUSES Common causes of hearing loss include:   Too much wax in the ear canal.   Infection of the ear canal or middle ear.   Fluid in the middle ear.   Injury to the ear or surrounding area.   An object stuck in the ear.   Prolonged exposure to loud sounds, such as music.  Less common causes of hearing loss include:   Tumors in the ear.   Viral or bacterial infections, such as meningitis.   A hole in the eardrum (perforated eardrum).  Problems with the hearing nerve that sends signals between the brain  and the ear.  Certain medicines.  SYMPTOMS  Symptoms of this condition may include:  Difficulty telling the difference between sounds.  Difficulty following a conversation when there is background noise.  Lack of response to sounds in your environment. This may be most noticeable when you do not respond to startling sounds.  Needing to turn up the volume on the television, radio, etc.  Ringing in the ears.  Dizziness.  Pain in the ears. DIAGNOSIS This condition is diagnosed based on a physical exam and a hearing test (audiometry). The audiometry test will be performed by a hearing specialist (audiologist). You may also be referred to an ear, nose, and throat (ENT)  specialist (otolaryngologist).  TREATMENT Treatment for recent onset of hearing loss may include:   Ear wax removal.   Being prescribed medicines to prevent infection (antibiotics).   Being prescribed medicines to reduce inflammation (corticosteroids).  HOME CARE INSTRUCTIONS  If you were prescribed an antibiotic medicine, take it as told by your health care provider. Do not stop taking the antibiotic even if you start to feel better.  Take over-the-counter and prescription medicines only as told by your health care provider.  Avoid loud noises.   Return to your normal activities as told by your health care provider. Ask your health care provider what activities are safe for you.  Keep all follow-up visits as told by your health care provider. This is important. SEEK MEDICAL CARE IF:   You feel dizzy.   You develop new symptoms.   You vomit or feel nauseous.   You have a fever.  SEEK IMMEDIATE MEDICAL CARE IF:  You develop sudden changes in your vision.   You have severe ear pain.   You have new or increased weakness.  You have a severe headache.   This information is not intended to replace advice given to you by your health care provider. Make sure you discuss any questions you have with your health care provider.   Document Released: 12/07/2005 Document Revised: 08/28/2015 Document Reviewed: 04/24/2015 Elsevier Interactive Patient Education [employer].
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
## 2016-06-26 NOTE — Telephone Encounter (Signed)
FYI

## 2016-06-30 ENCOUNTER — Ambulatory Visit (INDEPENDENT_AMBULATORY_CARE_PROVIDER_SITE_OTHER): Payer: Medicare Other | Admitting: Family Medicine

## 2016-06-30 VITALS — BP 122/82 | HR 82 | Temp 97.6°F | Ht 68.0 in | Wt 218.0 lb

## 2016-06-30 DIAGNOSIS — F4321 Adjustment disorder with depressed mood: Secondary | ICD-10-CM

## 2016-06-30 DIAGNOSIS — M791 Myalgia: Secondary | ICD-10-CM | POA: Diagnosis not present

## 2016-06-30 DIAGNOSIS — L821 Other seborrheic keratosis: Secondary | ICD-10-CM | POA: Diagnosis not present

## 2016-06-30 DIAGNOSIS — M545 Low back pain, unspecified: Secondary | ICD-10-CM

## 2016-06-30 DIAGNOSIS — M7062 Trochanteric bursitis, left hip: Secondary | ICD-10-CM | POA: Diagnosis not present

## 2016-06-30 DIAGNOSIS — F339 Major depressive disorder, recurrent, unspecified: Secondary | ICD-10-CM | POA: Insufficient documentation

## 2016-06-30 DIAGNOSIS — M7061 Trochanteric bursitis, right hip: Secondary | ICD-10-CM | POA: Diagnosis not present

## 2016-06-30 MED ORDER — TRIAMTERENE-HCTZ 75-50 MG PO TABS: ORAL_TABLET | ORAL | Status: DC

## 2016-06-30 MED ORDER — DULOXETINE HCL 30 MG PO CPEP: 30.0000 mg | ORAL_CAPSULE | Freq: Every day | ORAL | Status: DC

## 2016-06-30 MED ORDER — DULOXETINE HCL 60 MG PO CPEP: 60.0000 mg | ORAL_CAPSULE | Freq: Every day | ORAL | Status: DC
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
## 2016-06-30 NOTE — Patient Instructions (Signed)
Start the Cymbalta 30 mg daily for one week and then increase to 60 mg daily.

## 2016-06-30 NOTE — Progress Notes (Signed)
Pre visit review using our clinic review tool, if applicable. No additional management support is needed unless otherwise documented below in the visit note. 

## 2016-07-02 ENCOUNTER — Encounter: Payer: Self-pay | Admitting: Family Medicine

## 2016-07-05 NOTE — Progress Notes (Signed)
Agree with assessment as per Montine CircleSusan Hauck.   We have scheduled patient for follow up to further assess her depression symptoms.  Kristian CoveyBruce W Joevon Holliman MD [facility]

## 2016-07-28 ENCOUNTER — Ambulatory Visit (INDEPENDENT_AMBULATORY_CARE_PROVIDER_SITE_OTHER): Payer: Medicare Other | Admitting: Family Medicine

## 2016-07-28 VITALS — BP 114/80 | HR 75 | Temp 97.7°F | Ht 68.0 in | Wt 217.0 lb

## 2016-07-28 DIAGNOSIS — M545 Low back pain, unspecified: Secondary | ICD-10-CM

## 2016-07-28 DIAGNOSIS — F4321 Adjustment disorder with depressed mood: Secondary | ICD-10-CM

## 2016-07-28 DIAGNOSIS — G8929 Other chronic pain: Secondary | ICD-10-CM
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
## 2016-07-28 NOTE — Progress Notes (Signed)
Pre visit review using our clinic review tool, if applicable. No additional management support is needed unless otherwise documented below in the visit note. 

## 2016-08-10 ENCOUNTER — Ambulatory Visit: Payer: Medicare Other | Admitting: Family Medicine

## 2016-09-04 ENCOUNTER — Ambulatory Visit (INDEPENDENT_AMBULATORY_CARE_PROVIDER_SITE_OTHER): Payer: Medicare Other

## 2016-09-04 DIAGNOSIS — Z23 Encounter for immunization: Secondary | ICD-10-CM

## 2016-10-15 ENCOUNTER — Telehealth: Payer: Self-pay | Admitting: Family Medicine

## 2016-10-15 ENCOUNTER — Other Ambulatory Visit: Payer: Self-pay

## 2016-10-15 NOTE — Telephone Encounter (Signed)
Pt is aware that you are not in the office until Monday. I have updated her medication list. She will currently be taking the rest of her samples of lyrica by Friday 27th and then switching back to cymbalta on Saturday. If okay to d/c the cymbalta and fully start taking Lyrica then her other physician will prescribe.

## 2016-10-15 NOTE — Telephone Encounter (Signed)
° °  Pt call to say that Dr Maurice SmallIbazebo want her to start taking lyrica and he want to know if it is ok for her to take this. Pt said the only thing she is taking on a daily bases is her bp medicine. Would like a call back

## 2016-10-18 NOTE — Telephone Encounter (Signed)
I do not know of any reason that she cannot take the Lyrica.

## 2016-10-19 MED ORDER — PREGABALIN 75 MG PO CAPS: 75.0000 mg | ORAL_CAPSULE | Freq: Every day | ORAL | 1 refills | Status: DC

## 2016-10-19 NOTE — Telephone Encounter (Signed)
Pt is aware of annotations. I have updated medication list.

## 2016-12-17 DIAGNOSIS — R920 Mammographic microcalcification found on diagnostic imaging of breast: Secondary | ICD-10-CM | POA: Diagnosis not present

## 2016-12-18 ENCOUNTER — Other Ambulatory Visit (INDEPENDENT_AMBULATORY_CARE_PROVIDER_SITE_OTHER): Payer: Medicare Other

## 2016-12-18 DIAGNOSIS — Z0000 Encounter for general adult medical examination without abnormal findings: Secondary | ICD-10-CM | POA: Diagnosis not present

## 2016-12-18 DIAGNOSIS — R5383 Other fatigue: Secondary | ICD-10-CM | POA: Diagnosis not present

## 2016-12-18 LAB — CBC WITH DIFFERENTIAL/PLATELET
BASOS ABS: 0.1 10*3/uL (ref 0.0–0.1)
Basophils Relative: 2.2 % (ref 0.0–3.0)
EOS ABS: 0.2 10*3/uL (ref 0.0–0.7)
Eosinophils Relative: 2.3 % (ref 0.0–5.0)
HEMATOCRIT: 39.1 % (ref 36.0–46.0)
Hemoglobin: 13.5 g/dL (ref 12.0–15.0)
LYMPHS PCT: 23 % (ref 12.0–46.0)
Lymphs Abs: 1.5 10*3/uL (ref 0.7–4.0)
MCHC: 34.6 g/dL (ref 30.0–36.0)
MCV: 85.9 fl (ref 78.0–100.0)
MONOS PCT: 7.8 % (ref 3.0–12.0)
Monocytes Absolute: 0.5 10*3/uL (ref 0.1–1.0)
NEUTROS ABS: 4.3 10*3/uL (ref 1.4–7.7)
Neutrophils Relative %: 64.7 % (ref 43.0–77.0)
PLATELETS: 251 10*3/uL (ref 150.0–400.0)
RBC: 4.55 Mil/uL (ref 3.87–5.11)
RDW: 13.8 % (ref 11.5–15.5)
WBC: 6.7 10*3/uL (ref 4.0–10.5)

## 2016-12-18 LAB — HEPATIC FUNCTION PANEL
ALBUMIN: 4.2 g/dL (ref 3.5–5.2)
ALK PHOS: 80 U/L (ref 39–117)
ALT: 14 U/L (ref 0–35)
AST: 14 U/L (ref 0–37)
BILIRUBIN DIRECT: 0 mg/dL (ref 0.0–0.3)
TOTAL PROTEIN: 6.8 g/dL (ref 6.0–8.3)
Total Bilirubin: 0.3 mg/dL (ref 0.2–1.2)

## 2016-12-18 LAB — BASIC METABOLIC PANEL
BUN: 32 mg/dL — ABNORMAL HIGH (ref 6–23)
CALCIUM: 9.1 mg/dL (ref 8.4–10.5)
CO2: 29 meq/L (ref 19–32)
CREATININE: 1.08 mg/dL (ref 0.40–1.20)
Chloride: 102 mEq/L (ref 96–112)
GFR: 53.49 mL/min — AB (ref >60.00)
Glucose, Bld: 143 mg/dL — ABNORMAL HIGH (ref 70–99)
Potassium: 3.7 mEq/L (ref 3.5–5.1)
SODIUM: 140 meq/L (ref 135–145)

## 2016-12-18 LAB — TSH: TSH: 1.57 u[IU]/mL (ref 0.35–4.50)

## 2016-12-18 LAB — LIPID PANEL
CHOL/HDL RATIO: 4
Cholesterol: 164 mg/dL (ref 0–200)
HDL: 43.8 mg/dL (ref >39.00)
LDL Cholesterol: 83 mg/dL (ref 0–99)
NONHDL: 120.05
Triglycerides: 186 mg/dL — ABNORMAL HIGH (ref 0.0–149.0)
VLDL: 37.2 mg/dL (ref 0.0–40.0)

## 2016-12-22 ENCOUNTER — Other Ambulatory Visit: Payer: Self-pay | Admitting: Radiology

## 2016-12-22 DIAGNOSIS — Z0000 Encounter for general adult medical examination without abnormal findings: Secondary | ICD-10-CM | POA: Diagnosis not present

## 2016-12-22 DIAGNOSIS — Z803 Family history of malignant neoplasm of breast: Secondary | ICD-10-CM | POA: Diagnosis not present

## 2016-12-22 DIAGNOSIS — R920 Mammographic microcalcification found on diagnostic imaging of breast: Secondary | ICD-10-CM | POA: Diagnosis not present

## 2016-12-22 DIAGNOSIS — R599 Enlarged lymph nodes, unspecified: Secondary | ICD-10-CM | POA: Diagnosis not present

## 2016-12-22 LAB — HM MAMMOGRAPHY

## 2016-12-25 ENCOUNTER — Encounter: Payer: Self-pay | Admitting: Family Medicine

## 2016-12-25 ENCOUNTER — Ambulatory Visit (INDEPENDENT_AMBULATORY_CARE_PROVIDER_SITE_OTHER): Payer: Medicare Other | Admitting: Family Medicine

## 2016-12-25 VITALS — BP 122/82 | HR 87 | Temp 97.6°F | Ht 68.0 in | Wt 215.0 lb

## 2016-12-25 DIAGNOSIS — R739 Hyperglycemia, unspecified: Secondary | ICD-10-CM

## 2016-12-25 DIAGNOSIS — Z0000 Encounter for general adult medical examination without abnormal findings: Secondary | ICD-10-CM

## 2016-12-25 DIAGNOSIS — N6012 Diffuse cystic mastopathy of left breast: Secondary | ICD-10-CM | POA: Diagnosis not present

## 2016-12-25 LAB — POCT GLYCOSYLATED HEMOGLOBIN (HGB A1C): HEMOGLOBIN A1C: 6.3

## 2016-12-25 NOTE — Progress Notes (Signed)
Pre visit review using our clinic review tool, if applicable. No additional management support is needed unless otherwise documented below in the visit note. 
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
## 2016-12-25 NOTE — Patient Instructions (Addendum)
Food Choices to Lower Your Triglycerides Triglycerides are a type of fat in your blood. High levels of triglycerides can increase the risk of heart disease and stroke. If your triglyceride levels are high, the foods you eat and your eating habits are very important. Choosing the right foods can help lower your triglycerides. What general guidelines do I need to follow?  Lose weight if you are overweight.  Limit or avoid alcohol.  Fill one half of your plate with vegetables and green salads.  Limit fruit to two servings a day. Choose fruit instead of juice.  Make one fourth of your plate whole grains. Look for the word "whole" as the first word in the ingredient list.  Fill one fourth of your plate with lean protein foods.  Enjoy fatty fish (such as salmon, mackerel, sardines, and tuna) three times a week.  Choose healthy fats.  Limit foods high in starch and sugar.  Eat more home-cooked food and less restaurant, buffet, and fast food.  Limit fried foods.  Cook foods using methods other than frying.  Limit saturated fats.  Check ingredient lists to avoid foods with partially hydrogenated oils (trans fats) in them. What foods can I eat? Grains  Whole grains, such as whole wheat or whole grain breads, crackers, cereals, and pasta. Unsweetened oatmeal, bulgur, barley, quinoa, or brown rice. Corn or whole wheat flour tortillas. Vegetables  Fresh or frozen vegetables (raw, steamed, roasted, or grilled). Green salads. Fruits  All fresh, canned (in natural juice), or frozen fruits. Meat and Other Protein Products  Ground beef (85% or leaner), grass-fed beef, or beef trimmed of fat. Skinless chicken or [place]. Ground chicken or [place]. Pork trimmed of fat. All fish and seafood. Eggs. Dried beans, peas, or lentils. Unsalted nuts or seeds. Unsalted canned or dry beans. Dairy  Low-fat dairy products, such as skim or 1% milk, 2% or reduced-fat cheeses, low-fat ricotta or cottage cheese,  or plain low-fat yogurt. Fats and Oils  Tub margarines without trans fats. Light or reduced-fat mayonnaise and salad dressings. Avocado. Safflower, olive, or canola oils. Natural peanut or almond butter. The items listed above may not be a complete list of recommended foods or beverages. Contact your dietitian for more options.  What foods are not recommended? Grains  White bread. White pasta. White rice. Cornbread. Bagels, pastries, and croissants. Crackers that contain trans fat. Vegetables  White potatoes. Corn. Creamed or fried vegetables. Vegetables in a cheese sauce. Fruits  Dried fruits. Canned fruit in light or heavy syrup. Fruit juice. Meat and Other Protein Products  Fatty cuts of meat. Ribs, chicken wings, bacon, sausage, bologna, salami, chitterlings, fatback, hot dogs, bratwurst, and packaged luncheon meats. Dairy  Whole or 2% milk, cream, half-and-half, and cream cheese. Whole-fat or sweetened yogurt. Full-fat cheeses. Nondairy creamers and whipped toppings. Processed cheese, cheese spreads, or cheese curds. Sweets and Desserts  Corn syrup, sugars, honey, and molasses. Candy. Jam and jelly. Syrup. Sweetened cereals. Cookies, pies, cakes, donuts, muffins, and ice cream. Fats and Oils  Butter, stick margarine, lard, shortening, ghee, or bacon fat. Coconut, palm kernel, or palm oils. Beverages  Alcohol. Sweetened drinks (such as sodas, lemonade, and fruit drinks or punches). The items listed above may not be a complete list of foods and beverages to avoid. Contact your dietitian for more information.  This information is not intended to replace advice given to you by your health care provider. Make sure you discuss any questions you have with your health care provider. Document  Released: 09/24/2004 Document Revised: 05/14/2016 Document Reviewed: 10/11/2013 Elsevier Interactive Patient Education  2017 [employer]  -Reduce sugar and quite starch intake as discussed -Try to lose  some weight -Consistent exercise-recommend goal 150 minutes per week of moderate exercise

## 2016-12-28 ENCOUNTER — Encounter: Payer: Self-pay | Admitting: Family Medicine

## 2016-12-28 DIAGNOSIS — M5136 Other intervertebral disc degeneration, lumbar region: Secondary | ICD-10-CM | POA: Diagnosis not present

## 2016-12-28 DIAGNOSIS — M5416 Radiculopathy, lumbar region: Secondary | ICD-10-CM | POA: Diagnosis not present

## 2016-12-28 DIAGNOSIS — M545 Low back pain: Secondary | ICD-10-CM | POA: Diagnosis not present

## 2017-01-15 ENCOUNTER — Other Ambulatory Visit: Payer: Self-pay | Admitting: Gastroenterology

## 2017-01-22 DIAGNOSIS — M5416 Radiculopathy, lumbar region: Secondary | ICD-10-CM | POA: Diagnosis not present

## 2017-01-22 DIAGNOSIS — M5136 Other intervertebral disc degeneration, lumbar region: Secondary | ICD-10-CM | POA: Diagnosis not present

## 2017-01-22 DIAGNOSIS — M545 Low back pain: Secondary | ICD-10-CM | POA: Diagnosis not present

## 2017-03-02 DIAGNOSIS — M545 Low back pain: Secondary | ICD-10-CM | POA: Diagnosis not present

## 2017-03-02 DIAGNOSIS — M7061 Trochanteric bursitis, right hip: Secondary | ICD-10-CM | POA: Diagnosis not present

## 2017-03-02 DIAGNOSIS — M5136 Other intervertebral disc degeneration, lumbar region: Secondary | ICD-10-CM | POA: Diagnosis not present

## 2017-03-03 ENCOUNTER — Encounter: Payer: Self-pay | Admitting: Family Medicine

## 2017-03-22 ENCOUNTER — Telehealth: Payer: Self-pay | Admitting: Family Medicine

## 2017-03-22 NOTE — Telephone Encounter (Signed)
Noted  

## 2017-03-22 NOTE — Telephone Encounter (Signed)
Patient Name: Taylor Gay  DOB: 08-29-1946    Initial Comment Caller states she fell on Thursday night and damaged both of her feet - She believes one of them is in pretty bad shape - She has it wrapped right now, it is all black and blue halfway up her ankle. Would like to know if she should wait till Friday when she has her follow up appt for something else or if she should be seen sooner.    Nurse Assessment  Nurse: Stefano Gaul, RN, Dwana Curd Date/Time (Eastern Time): 03/22/2017 2:17:34 PM  Confirm and document reason for call. If symptomatic, describe symptoms. ---Caller states she fell last Thursday night in the parking lot. her legs went numb. She injured both her feet. Has a lot of hardware in left foot. Right foot has bruising to the side of her leg. Has abrasion on her right leg. She is propping up her feet.  Does the patient have any new or worsening symptoms? ---Yes  Will a triage be completed? ---Yes  Related visit to physician within the last 2 weeks? ---No  Does the PT have any chronic conditions? (i.e. diabetes, asthma, etc.) ---Yes  List chronic conditions. ---prediabetes  Is this a behavioral health or substance abuse call? ---No     Guidelines    Guideline Title Affirmed Question Affirmed Notes  Foot and Ankle Injury Large swelling or bruise (> 2 inches or 5 cm)    Final Disposition User   See Physician within 24 Hours [place], RN, [place]    Comments  Appt scheduled for 03/23/2017 at 11:45 am with Dr. Evelena Peat   Referrals  REFERRED TO PCP OFFICE   Disagree/Comply: Comply

## 2017-03-23 ENCOUNTER — Ambulatory Visit (INDEPENDENT_AMBULATORY_CARE_PROVIDER_SITE_OTHER): Payer: Medicare Other | Admitting: Family Medicine

## 2017-03-23 ENCOUNTER — Ambulatory Visit (INDEPENDENT_AMBULATORY_CARE_PROVIDER_SITE_OTHER)
Admission: RE | Admit: 2017-03-23 | Discharge: 2017-03-23 | Disposition: A | Discharge status: Home / Self Care | Payer: Medicare Other | Source: Ambulatory Visit | Attending: Family Medicine | Admitting: Family Medicine

## 2017-03-23 ENCOUNTER — Encounter: Payer: Self-pay | Admitting: Family Medicine

## 2017-03-23 VITALS — BP 120/88 | HR 75 | Temp 98.6°F

## 2017-03-23 DIAGNOSIS — R29898 Other symptoms and signs involving the musculoskeletal system: Secondary | ICD-10-CM

## 2017-03-23 DIAGNOSIS — M79671 Pain in right foot: Secondary | ICD-10-CM

## 2017-03-23 DIAGNOSIS — M25571 Pain in right ankle and joints of right foot: Secondary | ICD-10-CM

## 2017-03-23 DIAGNOSIS — S92251A Displaced fracture of navicular [scaphoid] of right foot, initial encounter for closed fracture: Secondary | ICD-10-CM | POA: Diagnosis not present

## 2017-03-23 DIAGNOSIS — S80211A Abrasion, right knee, initial encounter: Secondary | ICD-10-CM | POA: Diagnosis not present

## 2017-03-23 DIAGNOSIS — S9031XA Contusion of right foot, initial encounter: Secondary | ICD-10-CM | POA: Diagnosis not present

## 2017-03-23 NOTE — Progress Notes (Signed)
Pre visit review using our clinic review tool, if applicable. No additional management support is needed unless otherwise documented below in the visit note. 
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
## 2017-03-26 ENCOUNTER — Ambulatory Visit (INDEPENDENT_AMBULATORY_CARE_PROVIDER_SITE_OTHER): Payer: Medicare Other | Admitting: Family Medicine

## 2017-03-26 ENCOUNTER — Encounter: Payer: Self-pay | Admitting: Family Medicine

## 2017-03-26 ENCOUNTER — Other Ambulatory Visit: Payer: Self-pay | Admitting: *Deleted

## 2017-03-26 VITALS — BP 120/78 | HR 63 | Temp 97.6°F | Wt 216.5 lb

## 2017-03-26 DIAGNOSIS — F4321 Adjustment disorder with depressed mood: Secondary | ICD-10-CM

## 2017-03-26 DIAGNOSIS — I10 Essential (primary) hypertension: Secondary | ICD-10-CM | POA: Diagnosis not present

## 2017-03-26 DIAGNOSIS — R739 Hyperglycemia, unspecified: Secondary | ICD-10-CM | POA: Diagnosis not present

## 2017-03-26 DIAGNOSIS — S92151D Displaced avulsion fracture (chip fracture) of right talus, subsequent encounter for fracture with routine healing: Secondary | ICD-10-CM | POA: Diagnosis not present

## 2017-03-26 DIAGNOSIS — S92251D Displaced fracture of navicular [scaphoid] of right foot, subsequent encounter for fracture with routine healing: Secondary | ICD-10-CM | POA: Diagnosis not present

## 2017-03-26 LAB — POCT GLYCOSYLATED HEMOGLOBIN (HGB A1C): Hemoglobin A1C: 6.2

## 2017-03-26 MED ORDER — RANITIDINE HCL 300 MG PO TABS: 300.0000 mg | ORAL_TABLET | Freq: Two times a day (BID) | ORAL | 0 refills | Status: DC

## 2017-03-26 MED ORDER — BACLOFEN 10 MG PO TABS: 10.0000 mg | ORAL_TABLET | Freq: Three times a day (TID) | ORAL | 1 refills | Status: DC | PRN

## 2017-03-26 MED ORDER — DULOXETINE HCL 60 MG PO CPEP: 60.0000 mg | ORAL_CAPSULE | Freq: Every day | ORAL | 3 refills | Status: DC

## 2017-03-26 MED ORDER — MUPIROCIN 2 % EX OINT: 1.0000 "application " | TOPICAL_OINTMENT | Freq: Two times a day (BID) | CUTANEOUS | 3 refills | Status: DC | PRN

## 2017-03-26 NOTE — Patient Instructions (Signed)
Try to lose some weight Continue to scale back carbs and high glycemic foods.

## 2017-03-26 NOTE — Progress Notes (Signed)
Pre visit review using our clinic review tool, if applicable. No additional management support is needed unless otherwise documented below in the visit note. 
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
## 2017-03-31 ENCOUNTER — Telehealth: Payer: Self-pay | Admitting: Family Medicine

## 2017-03-31 MED ORDER — ZOSTER VAC RECOMB ADJUVANTED 50 MCG/0.5ML IM SUSR: 0.5000 mL | Freq: Once | INTRAMUSCULAR | 1 refills | Status: AC

## 2017-03-31 NOTE — Telephone Encounter (Signed)
Rx sent and patient is aware.  Patient will call Dewaine Conger for the bone spur.

## 2017-03-31 NOTE — Telephone Encounter (Signed)
Pt saw md on 03-23-17 and she thinks she does have heel spur. Pt would like to know should she get new shingle vaccine

## 2017-03-31 NOTE — Telephone Encounter (Signed)
OK to get.  Is she asking for written order??

## 2017-04-07 DIAGNOSIS — S8001XA Contusion of right knee, initial encounter: Secondary | ICD-10-CM | POA: Diagnosis not present

## 2017-04-07 DIAGNOSIS — M25572 Pain in left ankle and joints of left foot: Secondary | ICD-10-CM | POA: Diagnosis not present

## 2017-04-07 DIAGNOSIS — M25571 Pain in right ankle and joints of right foot: Secondary | ICD-10-CM | POA: Diagnosis not present

## 2017-04-22 ENCOUNTER — Telehealth: Payer: Self-pay | Admitting: Family Medicine

## 2017-04-22 NOTE — Telephone Encounter (Signed)
Pt would like to see if you would prescribe xanax for her so that she can get some rest and she is unable to go back to sleep.  Pt would like to have a call back. Pharm:  [facility]

## 2017-04-23 NOTE — Telephone Encounter (Signed)
I do not recommend Xanax- can be very habituating and difficult to stop.  Would be happy to discuss safer options if she wishes.

## 2017-04-23 NOTE — Telephone Encounter (Signed)
I called the pt and informed her of the message below. She stated she does not have money for an appt at time and I advised she call back for an appt and she agreed.

## 2017-04-28 ENCOUNTER — Ambulatory Visit (INDEPENDENT_AMBULATORY_CARE_PROVIDER_SITE_OTHER): Payer: Medicare Other | Admitting: Neurology

## 2017-04-28 ENCOUNTER — Other Ambulatory Visit (INDEPENDENT_AMBULATORY_CARE_PROVIDER_SITE_OTHER): Payer: Medicare Other

## 2017-04-28 ENCOUNTER — Encounter: Payer: Self-pay | Admitting: Neurology

## 2017-04-28 VITALS — BP 118/70 | HR 77 | Ht 68.0 in | Wt 216.3 lb

## 2017-04-28 DIAGNOSIS — R29898 Other symptoms and signs involving the musculoskeletal system: Secondary | ICD-10-CM

## 2017-04-28 DIAGNOSIS — R202 Paresthesia of skin: Secondary | ICD-10-CM

## 2017-04-28 DIAGNOSIS — R29818 Other symptoms and signs involving the nervous system: Secondary | ICD-10-CM | POA: Diagnosis not present

## 2017-04-28 DIAGNOSIS — G43509 Persistent migraine aura without cerebral infarction, not intractable, without status migrainosus: Secondary | ICD-10-CM

## 2017-04-28 LAB — FOLATE: FOLATE: 16.8 ng/mL (ref >5.9)

## 2017-04-28 LAB — VITAMIN B12: VITAMIN B 12: 236 pg/mL (ref 211–911)

## 2017-04-28 LAB — SEDIMENTATION RATE: SED RATE: 49 mm/h — AB (ref 0–30)

## 2017-04-28 MED ORDER — KETOROLAC TROMETHAMINE 60 MG/2ML IM SOLN
30.0000 mg | Freq: Once | INTRAMUSCULAR | Status: AC
  Administered 2017-04-28: 30 mg via INTRAMUSCULAR

## 2017-04-28 MED ORDER — DIAZEPAM 5 MG PO TABS: 5.0000 mg | ORAL_TABLET | Freq: Once | ORAL | 0 refills | Status: AC
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
## 2017-04-28 NOTE — Patient Instructions (Signed)
1.  NCS/EMG legs 2.  MRI/A brain 3.  Check labs 4.  Toradol injection today  We will call you with the results  Return to clinic in 4 months

## 2017-05-01 LAB — COPPER, SERUM: Copper: 128 ug/dL (ref 70–175)

## 2017-05-03 LAB — PROTEIN ELECTROPHORESIS, SERUM
ALBUMIN ELP: 4 g/dL (ref 3.8–4.8)
ALPHA-1-GLOBULIN: 0.4 g/dL — AB (ref 0.2–0.3)
ALPHA-2-GLOBULIN: 0.7 g/dL (ref 0.5–0.9)
Abnormal Protein Band2: NOT DETECTED g/dL
Abnormal Protein Band3: NOT DETECTED g/dL
BETA 2: 0.3 g/dL (ref 0.2–0.5)
Beta Globulin: 0.5 g/dL (ref 0.4–0.6)
GAMMA GLOBULIN: 0.8 g/dL (ref 0.8–1.7)
TOTAL PROTEIN, SERUM ELECTROPHOR: 6.8 g/dL (ref 6.1–8.1)

## 2017-05-04 LAB — IMMUNOFIXATION ELECTROPHORESIS
IGG (IMMUNOGLOBIN G), SERUM: 755 mg/dL (ref 694–1618)
IgA: 152 mg/dL (ref 81–463)
IgM, Serum: 133 mg/dL (ref 48–271)

## 2017-05-11 ENCOUNTER — Ambulatory Visit (INDEPENDENT_AMBULATORY_CARE_PROVIDER_SITE_OTHER): Payer: Medicare Other | Admitting: Neurology

## 2017-05-11 DIAGNOSIS — R29818 Other symptoms and signs involving the nervous system: Secondary | ICD-10-CM

## 2017-05-11 DIAGNOSIS — R29898 Other symptoms and signs involving the musculoskeletal system: Secondary | ICD-10-CM

## 2017-05-11 DIAGNOSIS — G629 Polyneuropathy, unspecified: Secondary | ICD-10-CM

## 2017-05-11 NOTE — Procedures (Signed)
[facility]  [address] [place], [address]  [place], [place] [address] Tel: [phone] Fax:  [phone] Test Date:  05/11/2017  Patient: Taylor Gay DOB: [birth year] Physician: Nita Sickle, DO  Sex: Female Height: 5\' 8"  Ref Phys: Nita Sickle, DO  ID#: [id number] Temp: 33.6C Technician:    Patient Complaints: This is a 71 year-old female referred for evaluation of episodic bilateral leg weakness and paresthesias of the feet.  NCV & EMG Findings: Extensive electrodiagnostic testing of the right lower extremity and additional studies of the left shows:  1. Bilateral sural and superficial peroneal sensory responses are absent. 2. At the extensor digitorum brevis, the right peroneal motor response is absent and reduced on the left. Bilateral peroneal motor responses at the tibialis anterior is within normal limits. Bilateral tibial motor responses show reduced amplitude and proximal stimulation was reduced despite maximal stimulation, due to body physiognomy.  3. Bilateral tibial H reflex studies are prolonged. 4. Chronic motor axon loss changes are seen affecting bilateral flexor digitorum longus and tibialis anterior muscles, without accompanied active denervation.   Impression: The electrophysiologic findings are most consistent with a symmetric and chronic sensorimotor polyneuropathy, axon loss in type, affecting the lower extremities. Overall, these findings are moderate in degree electrically.    ___________________________ Nita Sickle, DO    Nerve Conduction Studies Anti Sensory Summary Table   Stim Site NR Peak (ms) Norm Peak (ms) P-T Amp (V) Norm P-T Amp  Left Sup Peroneal Anti Sensory (Ant Lat Mall)  12 cm NR  <4.6  >3  Right Sup Peroneal Anti Sensory (Ant Lat Mall)  12 cm NR  <4.6  >3  Left Sural Anti Sensory (Lat Mall)  Calf NR  <4.6  >3  Right Sural Anti Sensory (Lat Mall)  Calf NR  <4.6  >3   Motor Summary Table   Stim Site NR Onset (ms) Norm Onset  (ms) O-P Amp (mV) Norm O-P Amp Site1 Site2 Delta-0 (ms) Dist (cm) Vel (m/s) Norm Vel (m/s)  Left Peroneal Motor (Ext Dig Brev)  Ankle    3.0 <6.0 1.6 >2.5 B Fib Ankle 8.4 37.0 44 >40  B Fib    11.4  1.3  Poplt B Fib 1.6 8.0 50 >40  Poplt    13.0  1.2         Right Peroneal Motor (Ext Dig Brev)  Ankle NR  <6.0  >2.5 B Fib Ankle  0.0  >40  B Fib NR     Poplt B Fib  0.0  >40  Poplt NR            Left Peroneal TA Motor (Tib Ant)  Fib Head    2.6 <4.5 4.7 >3 Poplit Fib Head 2.0 8.0 40 >40  Poplit    4.6  4.5         Right Peroneal TA Motor (Tib Ant)  Fib Head    3.0 <4.5 4.9 >3 Poplit Fib Head 1.4 8.0 57 >40  Poplit    4.4  4.8         Left Tibial Motor (Abd Hall Brev)    body habitus behind knee  Ankle    3.6 <6.0 2.9 >4 Knee Ankle 10.2 42.0 41 >40  Knee    13.8  0.9         Right Tibial Motor (Abd Hall Brev)    body habitus behind knee  Ankle    3.8 <6.0 3.2 >4 Knee Ankle  0.0  >40  Knee NR             H Reflex Studies   NR H-Lat (ms) Lat Norm (ms) L-R H-Lat (ms)  Left Tibial (Gastroc)     40.00 <35 4.22  Right Tibial (Gastroc)     35.78 <35 4.22   EMG   Side Muscle Ins Act Fibs Psw Fasc Number Recrt Dur Dur. Amp Amp. Poly Poly. Comment  Left AntTibialis Nml Nml Nml Nml 1- Rapid Some 1+ Some 1+ Nml Nml N/A  Left Gastroc Nml Nml Nml Nml Nml Nml Nml Nml Nml Nml Nml Nml N/A  Left Flex Dig Long Nml Nml Nml Nml 1- Rapid Some 1+ Some 2-.3- Nml Nml N/A  Left RectFemoris Nml Nml Nml Nml Nml Nml Nml Nml Nml Nml Nml Nml N/A  Left GluteusMed Nml Nml Nml Nml Nml Nml Nml Nml Nml Nml Nml Nml N/A  Left BicepsFemS Nml Nml Nml Nml Nml Nml Nml Nml Nml Nml Nml Nml N/A  Right AntTibialis Nml Nml Nml Nml 1- Rapid Few 1+ Few 1+ Nml Nml N/A  Right Gastroc Nml Nml Nml Nml Nml Nml Nml Nml Nml Nml Nml Nml N/A  Right Flex Dig Long Nml Nml Nml Nml 1- Rapid Some 1+ Some 1+ Nml Nml N/A  Right RectFemoris Nml Nml Nml Nml Nml Nml Nml Nml Nml Nml Nml Nml N/A  Right GluteusMed Nml Nml Nml Nml Nml Nml Nml Nml  Nml Nml Nml Nml N/A  Right BicepsFemS Nml Nml Nml Nml Nml Nml Nml Nml Nml Nml Nml Nml N/A      Waveforms:

## 2017-05-12 ENCOUNTER — Ambulatory Visit
Admission: RE | Admit: 2017-05-12 | Discharge: 2017-05-12 | Disposition: A | Discharge status: Home / Self Care | Payer: Medicare Other | Source: Ambulatory Visit | Attending: Neurology | Admitting: Neurology

## 2017-05-12 DIAGNOSIS — R29898 Other symptoms and signs involving the musculoskeletal system: Secondary | ICD-10-CM

## 2017-05-12 DIAGNOSIS — R202 Paresthesia of skin: Secondary | ICD-10-CM

## 2017-05-12 DIAGNOSIS — R29818 Other symptoms and signs involving the nervous system: Secondary | ICD-10-CM

## 2017-05-12 DIAGNOSIS — R42 Dizziness and giddiness: Secondary | ICD-10-CM | POA: Diagnosis not present

## 2017-05-14 ENCOUNTER — Telehealth: Payer: Self-pay | Admitting: *Deleted

## 2017-05-14 NOTE — Telephone Encounter (Signed)
-----   Message from [place] Chardonika K Patel, DO sent at 05/13/2017  4:14 PM EDT ----- Please inform patient that her MRI brain is normal for age, no evidence of anything worrisome.  Her blood vessels are normal.  She has neuropathy in the feet, which would explain the numbness of her feet but would not cause her legs to buckle and fall.  We can order routine EEG to look for abnormal brain activity, but my overall suspicion is low. Thanks.

## 2017-05-14 NOTE — Telephone Encounter (Signed)
Patient given results.   She will call back if she decides to do the EEG.

## 2017-05-19 ENCOUNTER — Telehealth: Payer: Self-pay | Admitting: Neurology

## 2017-05-19 NOTE — Telephone Encounter (Signed)
Reports faxed.  Called patient and she is ok from the fall.

## 2017-05-19 NOTE — Telephone Encounter (Signed)
Caller: Bonita QuinLinda  Urgent? No  Reason for the call: She called wanting to have her MRI and her EMG results  sent to [facility] Maurice [facility]bazebo [facility].  She also wanted to let you know that she fell yesterday while out eating lunch. Thanks

## 2017-05-21 ENCOUNTER — Ambulatory Visit: Payer: Medicare Other | Admitting: Neurology

## 2017-05-27 DIAGNOSIS — M5136 Other intervertebral disc degeneration, lumbar region: Secondary | ICD-10-CM | POA: Diagnosis not present

## 2017-06-04 DIAGNOSIS — M545 Low back pain: Secondary | ICD-10-CM | POA: Diagnosis not present

## 2017-06-24 DIAGNOSIS — Z6832 Body mass index (BMI) 32.0-32.9, adult: Secondary | ICD-10-CM | POA: Diagnosis not present

## 2017-06-24 DIAGNOSIS — M4316 Spondylolisthesis, lumbar region: Secondary | ICD-10-CM | POA: Diagnosis not present

## 2017-06-24 DIAGNOSIS — R2681 Unsteadiness on feet: Secondary | ICD-10-CM | POA: Diagnosis not present

## 2017-06-24 DIAGNOSIS — I10 Essential (primary) hypertension: Secondary | ICD-10-CM | POA: Diagnosis not present

## 2017-06-29 ENCOUNTER — Encounter: Payer: Self-pay | Admitting: Family Medicine

## 2017-06-29 DIAGNOSIS — M8580 Other specified disorders of bone density and structure, unspecified site: Secondary | ICD-10-CM | POA: Diagnosis not present

## 2017-06-29 DIAGNOSIS — N644 Mastodynia: Secondary | ICD-10-CM | POA: Diagnosis not present

## 2017-06-29 LAB — HM DEXA SCAN

## 2017-06-30 DIAGNOSIS — M5136 Other intervertebral disc degeneration, lumbar region: Secondary | ICD-10-CM | POA: Diagnosis not present

## 2017-06-30 DIAGNOSIS — M545 Low back pain: Secondary | ICD-10-CM | POA: Diagnosis not present

## 2017-07-02 ENCOUNTER — Encounter: Payer: Self-pay | Admitting: Family Medicine

## 2017-07-09 ENCOUNTER — Encounter: Payer: Self-pay | Admitting: Family Medicine

## 2017-07-13 DIAGNOSIS — R2681 Unsteadiness on feet: Secondary | ICD-10-CM | POA: Diagnosis not present

## 2017-07-13 DIAGNOSIS — M4802 Spinal stenosis, cervical region: Secondary | ICD-10-CM | POA: Diagnosis not present

## 2017-07-14 DIAGNOSIS — M4316 Spondylolisthesis, lumbar region: Secondary | ICD-10-CM | POA: Diagnosis not present

## 2017-07-14 DIAGNOSIS — I10 Essential (primary) hypertension: Secondary | ICD-10-CM | POA: Diagnosis not present

## 2017-07-14 DIAGNOSIS — Z6832 Body mass index (BMI) 32.0-32.9, adult: Secondary | ICD-10-CM | POA: Diagnosis not present
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
## 2017-07-16 ENCOUNTER — Ambulatory Visit (INDEPENDENT_AMBULATORY_CARE_PROVIDER_SITE_OTHER): Payer: Medicare Other

## 2017-07-16 VITALS — BP 106/58 | HR 78 | Ht 68.0 in | Wt 212.9 lb

## 2017-07-16 DIAGNOSIS — Z0000 Encounter for general adult medical examination without abnormal findings: Secondary | ICD-10-CM

## 2017-07-16 DIAGNOSIS — R94120 Abnormal auditory function study: Secondary | ICD-10-CM | POA: Diagnosis not present

## 2017-07-16 NOTE — Patient Instructions (Addendum)
Schedule eye appointment.   Schedule Audiology consult.   Bring a copy of your advance directives to your next office visit.  Continue doing brain stimulating activities (puzzles, reading, adult coloring books, staying active) to keep memory sharp.    Fall Prevention in the Home Falls can cause injuries. They can happen to people of all ages. There are many things you can do to make your home safe and to help prevent falls. What can I do on the outside of my home?  Regularly fix the edges of walkways and driveways and fix any cracks.  Remove anything that might make you trip as you walk through a door, such as a raised step or threshold.  Trim any bushes or trees on the path to your home.  Use bright outdoor lighting.  Clear any walking paths of anything that might make someone trip, such as rocks or tools.  Regularly check to see if handrails are loose or broken. Make sure that both sides of any steps have handrails.  Any raised decks and porches should have guardrails on the edges.  Have any leaves, snow, or ice cleared regularly.  Use sand or salt on walking paths during winter.  Clean up any spills in your garage right away. This includes oil or grease spills. What can I do in the bathroom?  Use night lights.  Install grab bars by the toilet and in the tub and shower. Do not use towel bars as grab bars.  Use non-skid mats or decals in the tub or shower.  If you need to sit down in the shower, use a plastic, non-slip stool.  Keep the floor dry. Clean up any water that spills on the floor as soon as it happens.  Remove soap buildup in the tub or shower regularly.  Attach bath mats securely with double-sided non-slip rug tape.  Do not have throw rugs and other things on the floor that can make you trip. What can I do in the bedroom?  Use night lights.  Make sure that you have a light by your bed that is easy to reach.  Do not use any sheets or blankets that are  too big for your bed. They should not hang down onto the floor.  Have a firm chair that has side arms. You can use this for support while you get dressed.  Do not have throw rugs and other things on the floor that can make you trip. What can I do in the kitchen?  Clean up any spills right away.  Avoid walking on wet floors.  Keep items that you use a lot in easy-to-reach places.  If you need to reach something above you, use a strong step stool that has a grab bar.  Keep electrical cords out of the way.  Do not use floor polish or wax that makes floors slippery. If you must use wax, use non-skid floor wax.  Do not have throw rugs and other things on the floor that can make you trip. What can I do with my stairs?  Do not leave any items on the stairs.  Make sure that there are handrails on both sides of the stairs and use them. Fix handrails that are broken or loose. Make sure that handrails are as long as the stairways.  Check any carpeting to make sure that it is firmly attached to the stairs. Fix any carpet that is loose or worn.  Avoid having throw rugs at the top or  bottom of the stairs. If you do have throw rugs, attach them to the floor with carpet tape.  Make sure that you have a light switch at the top of the stairs and the bottom of the stairs. If you do not have them, ask someone to add them for you. What else can I do to help prevent falls?  Wear shoes that: ? Do not have high heels. ? Have rubber bottoms. ? Are comfortable and fit you well. ? Are closed at the toe. Do not wear sandals.  If you use a stepladder: ? Make sure that it is fully opened. Do not climb a closed stepladder. ? Make sure that both sides of the stepladder are locked into place. ? Ask someone to hold it for you, if possible.  Clearly mark and make sure that you can see: ? Any grab bars or handrails. ? First and last steps. ? Where the edge of each step is.  Use tools that help you move  around (mobility aids) if they are needed. These include: ? Canes. ? Walkers. ? Scooters. ? Crutches.  Turn on the lights when you go into a dark area. Replace any light bulbs as soon as they burn out.  Set up your furniture so you have a clear path. Avoid moving your furniture around.  If any of your floors are uneven, fix them.  If there are any pets around you, be aware of where they are.  Review your medicines with your doctor. Some medicines can make you feel dizzy. This can increase your chance of falling. Ask your doctor what other things that you can do to help prevent falls. This information is not intended to replace advice given to you by your health care provider. Make sure you discuss any questions you have with your health care provider. Document Released: 10/03/2009 Document Revised: 05/14/2016 Document Reviewed: 01/11/2015 Elsevier Interactive Patient Education  2018 [employer] Maintenance, Female Adopting a healthy lifestyle and getting preventive care can go a long way to promote health and wellness. Talk with your health care provider about what schedule of regular examinations is right for you. This is a good chance for you to check in with your provider about disease prevention and staying healthy. In between checkups, there are plenty of things you can do on your own. Experts have done a lot of research about which lifestyle changes and preventive measures are most likely to keep you healthy. Ask your health care provider for more information. Weight and diet Eat a healthy diet  Be sure to include plenty of vegetables, fruits, low-fat dairy products, and lean protein.  Do not eat a lot of foods high in solid fats, added sugars, or salt.  Get regular exercise. This is one of the most important things you can do for your health. ? Most adults should exercise for at least 150 minutes each week. The exercise should increase your heart rate and make you  sweat (moderate-intensity exercise). ? Most adults should also do strengthening exercises at least twice a week. This is in addition to the moderate-intensity exercise.  Maintain a healthy weight  Body mass index (BMI) is a measurement that can be used to identify possible weight problems. It estimates body fat based on height and weight. Your health care provider can help determine your BMI and help you achieve or maintain a healthy weight.  For females 69 years of age and older: ? A BMI below 18.5 is considered  underweight. ? A BMI of 18.5 to 24.9 is normal. ? A BMI of 25 to 29.9 is considered overweight. ? A BMI of 30 and above is considered obese.  Watch levels of cholesterol and blood lipids  You should start having your blood tested for lipids and cholesterol at 69 years of age, then have this test every 5 years.  You may need to have your cholesterol levels checked more often if: ? Your lipid or cholesterol levels are high. ? You are older than 69 years of age. ? You are at high risk for heart disease.  Cancer screening Lung Cancer  Lung cancer screening is recommended for adults 70-32 years old who are at high risk for lung cancer because of a history of smoking.  A yearly low-dose CT scan of the lungs is recommended for people who: ? Currently smoke. ? Have quit within the past 15 years. ? Have at least a 30-pack-year history of smoking. A pack year is smoking an average of one pack of cigarettes a day for 1 year.  Yearly screening should continue until it has been 15 years since you quit.  Yearly screening should stop if you develop a health problem that would prevent you from having lung cancer treatment.  Breast Cancer  Practice breast self-awareness. This means understanding how your breasts normally appear and feel.  It also means doing regular breast self-exams. Let your health care provider know about any changes, no matter how small.  If you are in your 20s  or 30s, you should have a clinical breast exam (CBE) by a health care provider every 1-3 years as part of a regular health exam.  If you are 70 or older, have a CBE every year. Also consider having a breast X-ray (mammogram) every year.  If you have a family history of breast cancer, talk to your health care provider about genetic screening.  If you are at high risk for breast cancer, talk to your health care provider about having an MRI and a mammogram every year.  Breast cancer gene (BRCA) assessment is recommended for women who have family members with BRCA-related cancers. BRCA-related cancers include: ? Breast. ? Ovarian. ? Tubal. ? Peritoneal cancers.  Results of the assessment will determine the need for genetic counseling and BRCA1 and BRCA2 testing.  Cervical Cancer Your health care provider may recommend that you be screened regularly for cancer of the pelvic organs (ovaries, uterus, and vagina). This screening involves a pelvic examination, including checking for microscopic changes to the surface of your cervix (Pap test). You may be encouraged to have this screening done every 3 years, beginning at age 71.  For women ages 64-65, health care providers may recommend pelvic exams and Pap testing every 3 years, or they may recommend the Pap and pelvic exam, combined with testing for human papilloma virus (HPV), every 5 years. Some types of HPV increase your risk of cervical cancer. Testing for HPV may also be done on women of any age with unclear Pap test results.  Other health care providers may not recommend any screening for nonpregnant women who are considered low risk for pelvic cancer and who do not have symptoms. Ask your health care provider if a screening pelvic exam is right for you.  If you have had past treatment for cervical cancer or a condition that could lead to cancer, you need Pap tests and screening for cancer for at least 20 years after your treatment. If Pap tests  have been discontinued, your risk factors (such as having a new sexual partner) need to be reassessed to determine if screening should resume. Some women have medical problems that increase the chance of getting cervical cancer. In these cases, your health care provider may recommend more frequent screening and Pap tests.  Colorectal Cancer  This type of cancer can be detected and often prevented.  Routine colorectal cancer screening usually begins at 69 years of age and continues through 69 years of age.  Your health care provider may recommend screening at an earlier age if you have risk factors for colon cancer.  Your health care provider may also recommend using home test kits to check for hidden blood in the stool.  A small camera at the end of a tube can be used to examine your colon directly (sigmoidoscopy or colonoscopy). This is done to check for the earliest forms of colorectal cancer.  Routine screening usually begins at age 57.  Direct examination of the colon should be repeated every 5-10 years through 69 years of age. However, you may need to be screened more often if early forms of precancerous polyps or small growths are found.  Skin Cancer  Check your skin from head to toe regularly.  Tell your health care provider about any new moles or changes in moles, especially if there is a change in a mole's shape or color.  Also tell your health care provider if you have a mole that is larger than the size of a pencil eraser.  Always use sunscreen. Apply sunscreen liberally and repeatedly throughout the day.  Protect yourself by wearing long sleeves, pants, a wide-brimmed hat, and sunglasses whenever you are outside.  Heart disease, diabetes, and high blood pressure  High blood pressure causes heart disease and increases the risk of stroke. High blood pressure is more likely to develop in: ? People who have blood pressure in the high end of the normal range (130-139/85-89 mm  Hg). ? People who are overweight or obese. ? People who are African American.  If you are 6-54 years of age, have your blood pressure checked every 3-5 years. If you are 64 years of age or older, have your blood pressure checked every year. You should have your blood pressure measured twice-once when you are at a hospital or clinic, and once when you are not at a hospital or clinic. Record the average of the two measurements. To check your blood pressure when you are not at a hospital or clinic, you can use: ? An automated blood pressure machine at a pharmacy. ? A home blood pressure monitor.  If you are between 93 years and 82 years old, ask your health care provider if you should take aspirin to prevent strokes.  Have regular diabetes screenings. This involves taking a blood sample to check your fasting blood sugar level. ? If you are at a normal weight and have a low risk for diabetes, have this test once every three years after 69 years of age. ? If you are overweight and have a high risk for diabetes, consider being tested at a younger age or more often. Preventing infection Hepatitis B  If you have a higher risk for hepatitis B, you should be screened for this virus. You are considered at high risk for hepatitis B if: ? You were born in a country where hepatitis B is common. Ask your health care provider which countries are considered high risk. ? Your parents were born  in a high-risk country, and you have not been immunized against hepatitis B (hepatitis B vaccine). ? You have HIV or AIDS. ? You use needles to inject street drugs. ? You live with someone who has hepatitis B. ? You have had sex with someone who has hepatitis B. ? You get hemodialysis treatment. ? You take certain medicines for conditions, including cancer, organ transplantation, and autoimmune conditions.  Hepatitis C  Blood testing is recommended for: ? Everyone born from [birth year] through 1963. ? Anyone with known  risk factors for hepatitis C.  Sexually transmitted infections (STIs)  You should be screened for sexually transmitted infections (STIs) including gonorrhea and chlamydia if: ? You are sexually active and are younger than 69 years of age. ? You are older than 69 years of age and your health care provider tells you that you are at risk for this type of infection. ? Your sexual activity has changed since you were last screened and you are at an increased risk for chlamydia or gonorrhea. Ask your health care provider if you are at risk.  If you do not have HIV, but are at risk, it may be recommended that you take a prescription medicine daily to prevent HIV infection. This is called pre-exposure prophylaxis (PrEP). You are considered at risk if: ? You are sexually active and do not regularly use condoms or know the HIV status of your partner(s). ? You take drugs by injection. ? You are sexually active with a partner who has HIV.  Talk with your health care provider about whether you are at high risk of being infected with HIV. If you choose to begin PrEP, you should first be tested for HIV. You should then be tested every 3 months for as long as you are taking PrEP. Pregnancy  If you are premenopausal and you may become pregnant, ask your health care provider about preconception counseling.  If you may become pregnant, take 400 to 800 micrograms (mcg) of folic acid every day.  If you want to prevent pregnancy, talk to your health care provider about birth control (contraception). Osteoporosis and menopause  Osteoporosis is a disease in which the bones lose minerals and strength with aging. This can result in serious bone fractures. Your risk for osteoporosis can be identified using a bone density scan.  If you are 29 years of age or older, or if you are at risk for osteoporosis and fractures, ask your health care provider if you should be screened.  Ask your health care provider whether you  should take a calcium or vitamin D supplement to lower your risk for osteoporosis.  Menopause may have certain physical symptoms and risks.  Hormone replacement therapy may reduce some of these symptoms and risks. Talk to your health care provider about whether hormone replacement therapy is right for you. Follow these instructions at home:  Schedule regular health, dental, and eye exams.  Stay current with your immunizations.  Do not use any tobacco products including cigarettes, chewing tobacco, or electronic cigarettes.  If you are pregnant, do not drink alcohol.  If you are breastfeeding, limit how much and how often you drink alcohol.  Limit alcohol intake to no more than 1 drink per day for nonpregnant women. One drink equals 12 ounces of beer, 5 ounces of wine, or 1 ounces of hard liquor.  Do not use street drugs.  Do not share needles.  Ask your health care provider for help if you need support or information  about quitting drugs.  Tell your health care provider if you often feel depressed.  Tell your health care provider if you have ever been abused or do not feel safe at home. This information is not intended to replace advice given to you by your health care provider. Make sure you discuss any questions you have with your health care provider. Document Released: 06/22/2011 Document Revised: 05/14/2016 Document Reviewed: 09/10/2015 Elsevier Interactive Patient Education  [employer].

## 2017-07-19 ENCOUNTER — Other Ambulatory Visit: Payer: Self-pay | Admitting: Neurosurgery

## 2017-07-26 ENCOUNTER — Other Ambulatory Visit: Payer: Self-pay | Admitting: Family Medicine

## 2017-08-04 NOTE — Pre-Procedure Instructions (Signed)
Taylor NeighboursLinda B Gay  08/04/2017      [facility] [place] [place] [address] Phone: [phone] Fax: [phone]    Your procedure is scheduled on Aug 24.  Report to [facility] at 530 A.M.  Call this number if you have problems the morning of surgery:  [phone]   Remember:  Do not eat food or drink liquids after midnight.  Take these medicines the morning of surgery with A SIP OF WATER Duloxetine (Cymbalta), Oxycodone (Percocet) if needed, Ranitidine (Zantac) if needed, Ventolin inhaler if needed Bring your inhaler with you on the day of surgery.   Stop taking aspirin, BC's, Goody's, Herbal medications, Fish Oil, Aleve, Ibuprofen, advil, Motrin   Do not wear jewelry, make-up or nail polish.  Do not wear lotions, powders, or perfumes, or deoderant.  Do not shave 48 hours prior to surgery.  Men may shave face and neck.  Do not bring valuables to the hospital.  [facility] is not responsible for any belongings or valuables.  Contacts, dentures or bridgework may not be worn into surgery.  Leave your suitcase in the car.  After surgery it may be brought to your room.  For patients admitted to the hospital, discharge time will be determined by your treatment team.  Patients discharged the day of surgery will not be allowed to drive home.   Special instructions:  [facility] - Preparing for Surgery  Before surgery, you can play an important role.  Because skin is not sterile, your skin needs to be as free of germs as possible.  You can reduce the number of germs on you skin by washing with CHG (chlorahexidine gluconate) soap before surgery.  CHG is an antiseptic cleaner which kills germs and bonds with the skin to continue killing germs even after washing.  Please DO NOT use if you have an allergy to CHG or antibacterial soaps.  If your skin becomes reddened/irritated stop using the CHG  and inform your nurse when you arrive at Short Stay.  Do not shave (including legs and underarms) for at least 48 hours prior to the first CHG shower.  You may shave your face.  Please follow these instructions carefully:   1.  Shower with CHG Soap the night before surgery and the                                morning of Surgery.  2.  If you choose to wash your hair, wash your hair first as usual with your       normal shampoo.  3.  After you shampoo, rinse your hair and body thoroughly to remove the                      Shampoo.  4.  Use CHG as you would any other liquid soap.  You can apply chg directly       to the skin and wash gently with scrungie or a clean washcloth.  5.  Apply the CHG Soap to your body ONLY FROM THE NECK DOWN.        Do not use on open wounds or open sores.  Avoid contact with your eyes,       ears, mouth and genitals (private parts).  Wash genitals (private parts)  with your normal soap.  6.  Wash thoroughly, paying special attention to the area where your surgery        will be performed.  7.  Thoroughly rinse your body with warm water from the neck down.  8.  DO NOT shower/wash with your normal soap after using and rinsing off       the CHG Soap.  9.  Pat yourself dry with a clean towel.            10.  Wear clean pajamas.            11.  Place clean sheets on your bed the night of your first shower and do not        sleep with pets.  Day of Surgery  Do not apply any lotions/deoderants the morning of surgery.  Please wear clean clothes to the hospital/surgery center.     Please read over the following fact sheets that you were given. Pain Booklet, Coughing and Deep Breathing, MRSA Information and Surgical Site Infection Prevention

## 2017-08-04 NOTE — Pre-Procedure Instructions (Signed)
Taylor NeighboursLinda B Harting  08/04/2017      [facility] [place] [place] [address] Phone: [phone] Fax: [phone]    Your procedure is scheduled on August 24  [facility] at 0530 A.M.  Call this number if you have problems the morning of surgery:  [phone]   Remember:  Do not eat food or drink liquids after midnight.  Continue all other medications as directed by your physician except follow these instructions about you medications   Take these medicines the morning of surgery with A SIP OF WATER DULoxetine (CYMBALTA),  oxyCODONE-acetaminophen (PERCOCET/ROXICET) , ranitidine (ZANTAC) if needed, VENTOLIN HFA if needed  7 days prior to surgery STOP taking any Aspirin, Aleve, Naproxen, Ibuprofen, Motrin, Advil, Goody's, BC's, all herbal medications, fish oil, and all vitamins    Do not wear jewelry, make-up or nail polish.  Do not wear lotions, powders, or perfumes, or deoderant.  Do not shave 48 hours prior to surgery.    Do not bring valuables to the hospital.  [facility] is not responsible for any belongings or valuables.  Contacts, dentures or bridgework may not be worn into surgery.  Leave your suitcase in the car.  After surgery it may be brought to your room.  For patients admitted to the hospital, discharge time will be determined by your treatment team.  Patients discharged the day of surgery will not be allowed to drive home.    Special instructions:   [place]- Preparing For Surgery  Before surgery, you can play an important role. Because skin is not sterile, your skin needs to be as free of germs as possible. You can reduce the number of germs on your skin by washing with CHG (chlorahexidine gluconate) Soap before surgery.  CHG is an antiseptic cleaner which kills germs and bonds with the skin to continue killing germs even after washing.  Please do not  use if you have an allergy to CHG or antibacterial soaps. If your skin becomes reddened/irritated stop using the CHG.  Do not shave (including legs and underarms) for at least 48 hours prior to first CHG shower. It is OK to shave your face.  Please follow these instructions carefully.   1. Shower the NIGHT BEFORE SURGERY and the MORNING OF SURGERY with CHG.   2. If you chose to wash your hair, wash your hair first as usual with your normal shampoo.  3. After you shampoo, rinse your hair and body thoroughly to remove the shampoo.  4. Use CHG as you would any other liquid soap. You can apply CHG directly to the skin and wash gently with a scrungie or a clean washcloth.   5. Apply the CHG Soap to your body ONLY FROM THE NECK DOWN.  Do not use on open wounds or open sores. Avoid contact with your eyes, ears, mouth and genitals (private parts). Wash genitals (private parts) with your normal soap.  6. Wash thoroughly, paying special attention to the area where your surgery will be performed.  7. Thoroughly rinse your body with warm water from the neck down.  8. DO NOT shower/wash with your normal soap after using and rinsing off the CHG Soap.  9. Pat yourself dry with a CLEAN TOWEL.   10. Wear CLEAN PAJAMAS   11. Place CLEAN SHEETS on your bed the night of your first shower and DO NOT SLEEP WITH PETS.  Day of Surgery: Do not apply any deodorants/lotions. Please wear clean clothes to the hospital/surgery center.      Please read over the following fact sheets that you were given.

## 2017-08-05 ENCOUNTER — Encounter (HOSPITAL_COMMUNITY)
Admission: RE | Admit: 2017-08-05 | Discharge: 2017-08-05 | Disposition: A | Discharge status: Home / Self Care | Payer: Medicare Other | Source: Ambulatory Visit | Attending: Neurosurgery | Admitting: Neurosurgery

## 2017-08-05 ENCOUNTER — Encounter (HOSPITAL_COMMUNITY): Payer: Self-pay

## 2017-08-05 DIAGNOSIS — Z01818 Encounter for other preprocedural examination: Secondary | ICD-10-CM | POA: Diagnosis not present

## 2017-08-05 DIAGNOSIS — I10 Essential (primary) hypertension: Secondary | ICD-10-CM | POA: Diagnosis not present

## 2017-08-05 DIAGNOSIS — M4316 Spondylolisthesis, lumbar region: Secondary | ICD-10-CM | POA: Diagnosis not present

## 2017-08-05 HISTORY — DX: Unspecified atherosclerosis: I70.90

## 2017-08-05 HISTORY — DX: Gastro-esophageal reflux disease without esophagitis: K21.9

## 2017-08-05 HISTORY — DX: Major depressive disorder, single episode, unspecified: F32.9

## 2017-08-05 HISTORY — DX: Polyneuropathy, unspecified: G62.9

## 2017-08-05 HISTORY — DX: Depression, unspecified: F32.A

## 2017-08-05 HISTORY — DX: Prediabetes: R73.03

## 2017-08-05 LAB — TYPE AND SCREEN
ABO/RH(D): A POS
ANTIBODY SCREEN: NEGATIVE

## 2017-08-05 LAB — CBC
HEMATOCRIT: 37.9 % (ref 36.0–46.0)
Hemoglobin: 12.8 g/dL (ref 12.0–15.0)
MCH: 29.1 pg (ref 26.0–34.0)
MCHC: 33.8 g/dL (ref 30.0–36.0)
MCV: 86.1 fL (ref 78.0–100.0)
PLATELETS: 279 10*3/uL (ref 150–400)
RBC: 4.4 MIL/uL (ref 3.87–5.11)
RDW: 14 % (ref 11.5–15.5)
WBC: 6.7 10*3/uL (ref 4.0–10.5)

## 2017-08-05 LAB — BASIC METABOLIC PANEL
ANION GAP: 8 (ref 5–15)
BUN: 16 mg/dL (ref 6–20)
CO2: 28 mmol/L (ref 22–32)
Calcium: 9.6 mg/dL (ref 8.9–10.3)
Chloride: 106 mmol/L (ref 101–111)
Creatinine, Ser: 1.03 mg/dL — ABNORMAL HIGH (ref 0.44–1.00)
GFR, EST NON AFRICAN AMERICAN: 54 mL/min — AB (ref >60)
GLUCOSE: 112 mg/dL — AB (ref 65–99)
POTASSIUM: 4 mmol/L (ref 3.5–5.1)
Sodium: 142 mmol/L (ref 135–145)

## 2017-08-05 LAB — SURGICAL PCR SCREEN
MRSA, PCR: NEGATIVE
STAPHYLOCOCCUS AUREUS: NEGATIVE

## 2017-08-12 ENCOUNTER — Encounter (HOSPITAL_COMMUNITY): Payer: Self-pay | Admitting: Anesthesiology

## 2017-08-12 NOTE — Anesthesia Preprocedure Evaluation (Addendum)
Anesthesia Evaluation  Patient identified by MRN, date of birth, ID band Patient awake    Reviewed: Allergy & Precautions, NPO status , Patient's Chart, lab work & pertinent test results  Airway Mallampati: III  TM Distance: >3 FB Neck ROM: Full    Dental  (+) Teeth Intact, Dental Advisory Given   Pulmonary neg pulmonary ROS,    breath sounds clear to auscultation       Cardiovascular hypertension,  Rhythm:Regular Rate:Normal     Neuro/Psych  Headaches, PSYCHIATRIC DISORDERS Depression    GI/Hepatic Neg liver ROS, PUD, GERD  Medicated,  Endo/Other  negative endocrine ROS  Renal/GU negative Renal ROS     Musculoskeletal  (+) Arthritis ,   Abdominal   Peds  Hematology negative hematology ROS (+)   Anesthesia Other Findings Day of surgery medications reviewed with the patient.  Reproductive/Obstetrics                            Lab Results  Component Value Date   WBC 6.7 08/05/2017   HGB 12.8 08/05/2017   HCT 37.9 08/05/2017   MCV 86.1 08/05/2017   PLT 279 08/05/2017   Lab Results  Component Value Date   CREATININE 1.03 (H) 08/05/2017   BUN 16 08/05/2017   NA 142 08/05/2017   K 4.0 08/05/2017   CL 106 08/05/2017   CO2 28 08/05/2017   EKG: normal sinus rhythm.   Anesthesia Physical Anesthesia Plan  ASA: II  Anesthesia Plan: General   Post-op Pain Management:    Induction: Intravenous  PONV Risk Score and Plan: 4 or greater and Ondansetron, Dexamethasone, Midazolam, Scopolamine patch - Pre-op, Propofol infusion and Treatment may vary due to age or medical condition  Airway Management Planned: Oral ETT  Additional Equipment:   Intra-op Plan:   Post-operative Plan: Extubation in OR  Informed Consent: I have reviewed the patients History and Physical, chart, labs and discussed the procedure including the risks, benefits and alternatives for the proposed anesthesia with  the patient or authorized representative who has indicated his/her understanding and acceptance.   Dental advisory given  Plan Discussed with: CRNA  Anesthesia Plan Comments:       Anesthesia Quick Evaluation

## 2017-08-13 ENCOUNTER — Inpatient Hospital Stay (HOSPITAL_COMMUNITY): Payer: Medicare Other | Admitting: Anesthesiology

## 2017-08-13 ENCOUNTER — Inpatient Hospital Stay (HOSPITAL_COMMUNITY)
Admission: RE | Disposition: A | Discharge status: Home / Self Care | Payer: Self-pay | Source: Ambulatory Visit | Attending: Neurosurgery

## 2017-08-13 ENCOUNTER — Encounter (HOSPITAL_COMMUNITY): Payer: Self-pay | Admitting: *Deleted

## 2017-08-13 ENCOUNTER — Inpatient Hospital Stay (HOSPITAL_COMMUNITY): Payer: Medicare Other

## 2017-08-13 ENCOUNTER — Inpatient Hospital Stay (HOSPITAL_COMMUNITY)
Admission: RE | Admit: 2017-08-13 | Discharge: 2017-08-14 | DRG: 460 | Disposition: A | Discharge status: Home / Self Care | Payer: Medicare Other | Source: Ambulatory Visit | Attending: Neurosurgery | Admitting: Neurosurgery

## 2017-08-13 DIAGNOSIS — M4316 Spondylolisthesis, lumbar region: Secondary | ICD-10-CM | POA: Diagnosis not present

## 2017-08-13 DIAGNOSIS — Z888 Allergy status to other drugs, medicaments and biological substances status: Secondary | ICD-10-CM

## 2017-08-13 DIAGNOSIS — E739 Lactose intolerance, unspecified: Secondary | ICD-10-CM | POA: Diagnosis not present

## 2017-08-13 DIAGNOSIS — Z79899 Other long term (current) drug therapy: Secondary | ICD-10-CM | POA: Diagnosis not present

## 2017-08-13 DIAGNOSIS — N390 Urinary tract infection, site not specified: Secondary | ICD-10-CM | POA: Diagnosis not present

## 2017-08-13 DIAGNOSIS — Z96651 Presence of right artificial knee joint: Secondary | ICD-10-CM | POA: Diagnosis not present

## 2017-08-13 DIAGNOSIS — Z8711 Personal history of peptic ulcer disease: Secondary | ICD-10-CM

## 2017-08-13 DIAGNOSIS — M4326 Fusion of spine, lumbar region: Secondary | ICD-10-CM | POA: Diagnosis not present

## 2017-08-13 DIAGNOSIS — K589 Irritable bowel syndrome without diarrhea: Secondary | ICD-10-CM | POA: Diagnosis present

## 2017-08-13 DIAGNOSIS — Z8249 Family history of ischemic heart disease and other diseases of the circulatory system: Secondary | ICD-10-CM

## 2017-08-13 DIAGNOSIS — Z90710 Acquired absence of both cervix and uterus: Secondary | ICD-10-CM

## 2017-08-13 DIAGNOSIS — I5030 Unspecified diastolic (congestive) heart failure: Secondary | ICD-10-CM

## 2017-08-13 DIAGNOSIS — I10 Essential (primary) hypertension: Secondary | ICD-10-CM | POA: Diagnosis not present

## 2017-08-13 DIAGNOSIS — Z883 Allergy status to other anti-infective agents status: Secondary | ICD-10-CM

## 2017-08-13 DIAGNOSIS — M48062 Spinal stenosis, lumbar region with neurogenic claudication: Secondary | ICD-10-CM | POA: Diagnosis not present

## 2017-08-13 DIAGNOSIS — Z885 Allergy status to narcotic agent status: Secondary | ICD-10-CM

## 2017-08-13 DIAGNOSIS — M48061 Spinal stenosis, lumbar region without neurogenic claudication: Secondary | ICD-10-CM | POA: Diagnosis not present

## 2017-08-13 DIAGNOSIS — F4321 Adjustment disorder with depressed mood: Secondary | ICD-10-CM | POA: Diagnosis present

## 2017-08-13 DIAGNOSIS — Z8614 Personal history of Methicillin resistant Staphylococcus aureus infection: Secondary | ICD-10-CM

## 2017-08-13 DIAGNOSIS — K219 Gastro-esophageal reflux disease without esophagitis: Secondary | ICD-10-CM | POA: Diagnosis not present

## 2017-08-13 SURGERY — POSTERIOR LUMBAR FUSION 1 LEVEL
Anesthesia: General

## 2017-08-13 MED ORDER — FENTANYL CITRATE (PF) 250 MCG/5ML IJ SOLN
INTRAMUSCULAR | Status: AC
  Filled 2017-08-13: qty 5

## 2017-08-13 MED ORDER — SODIUM CHLORIDE 0.9 % IV SOLN: INTRAVENOUS | Status: DC

## 2017-08-13 MED ORDER — ALBUMIN HUMAN 5 % IV SOLN
INTRAVENOUS | Status: DC | PRN
  Administered 2017-08-13 (×2): via INTRAVENOUS

## 2017-08-13 MED ORDER — PHENYLEPHRINE HCL 10 MG/ML IJ SOLN
INTRAVENOUS | Status: DC | PRN
  Administered 2017-08-13: 30 ug/min via INTRAVENOUS

## 2017-08-13 MED ORDER — SENNA 8.6 MG PO TABS
1.0000 | ORAL_TABLET | Freq: Two times a day (BID) | ORAL | Status: DC
  Administered 2017-08-13 – 2017-08-14 (×2): 8.6 mg via ORAL
  Filled 2017-08-13 (×2): qty 1

## 2017-08-13 MED ORDER — PROMETHAZINE HCL 25 MG/ML IJ SOLN
INTRAMUSCULAR | Status: AC
  Administered 2017-08-13: 12.5 mg via INTRAVENOUS
  Filled 2017-08-13: qty 1

## 2017-08-13 MED ORDER — PROPOFOL 10 MG/ML IV BOLUS
INTRAVENOUS | Status: AC
  Filled 2017-08-13: qty 20

## 2017-08-13 MED ORDER — PROMETHAZINE HCL 25 MG/ML IJ SOLN
6.2500 mg | INTRAMUSCULAR | Status: DC | PRN
  Administered 2017-08-13: 12.5 mg via INTRAVENOUS

## 2017-08-13 MED ORDER — ONDANSETRON HCL 4 MG/2ML IJ SOLN
INTRAMUSCULAR | Status: AC
  Filled 2017-08-13: qty 2

## 2017-08-13 MED ORDER — MIDAZOLAM HCL 5 MG/5ML IJ SOLN
INTRAMUSCULAR | Status: DC | PRN
  Administered 2017-08-13: 2 mg via INTRAVENOUS

## 2017-08-13 MED ORDER — METHOCARBAMOL 500 MG PO TABS
ORAL_TABLET | ORAL | Status: AC
  Filled 2017-08-13: qty 1

## 2017-08-13 MED ORDER — LIDOCAINE-EPINEPHRINE 1 %-1:100000 IJ SOLN
INTRAMUSCULAR | Status: AC
Start: 2017-08-13 — End: 2017-08-13
  Filled 2017-08-13: qty 1

## 2017-08-13 MED ORDER — METHOCARBAMOL 500 MG PO TABS
500.0000 mg | ORAL_TABLET | Freq: Four times a day (QID) | ORAL | Status: DC | PRN
  Administered 2017-08-13 – 2017-08-14 (×3): 500 mg via ORAL
  Filled 2017-08-13 (×2): qty 1

## 2017-08-13 MED ORDER — SUGAMMADEX SODIUM 200 MG/2ML IV SOLN
INTRAVENOUS | Status: DC | PRN
  Administered 2017-08-13: 200 mg via INTRAVENOUS

## 2017-08-13 MED ORDER — OXYCODONE HCL 5 MG PO TABS
5.0000 mg | ORAL_TABLET | ORAL | Status: DC | PRN
  Administered 2017-08-13 – 2017-08-14 (×4): 10 mg via ORAL
  Filled 2017-08-13 (×4): qty 2

## 2017-08-13 MED ORDER — BISACODYL 10 MG RE SUPP: 10.0000 mg | Freq: Every day | RECTAL | Status: DC | PRN

## 2017-08-13 MED ORDER — THROMBIN 5000 UNITS EX SOLR
CUTANEOUS | Status: AC
  Filled 2017-08-13: qty 5000

## 2017-08-13 MED ORDER — HYDROMORPHONE HCL 1 MG/ML IJ SOLN
INTRAMUSCULAR | Status: AC
  Administered 2017-08-13: 0.5 mg via INTRAVENOUS
  Filled 2017-08-13: qty 1

## 2017-08-13 MED ORDER — FENTANYL CITRATE (PF) 100 MCG/2ML IJ SOLN
INTRAMUSCULAR | Status: DC | PRN
  Administered 2017-08-13 (×7): 50 ug via INTRAVENOUS

## 2017-08-13 MED ORDER — CEFAZOLIN SODIUM-DEXTROSE 2-4 GM/100ML-% IV SOLN
2.0000 g | Freq: Three times a day (TID) | INTRAVENOUS | Status: AC
  Administered 2017-08-13 (×2): 2 g via INTRAVENOUS
  Filled 2017-08-13 (×2): qty 100

## 2017-08-13 MED ORDER — ROCURONIUM BROMIDE 100 MG/10ML IV SOLN
INTRAVENOUS | Status: DC | PRN
  Administered 2017-08-13: 40 mg via INTRAVENOUS
  Administered 2017-08-13: 20 mg via INTRAVENOUS

## 2017-08-13 MED ORDER — ACETAMINOPHEN 325 MG PO TABS: 650.0000 mg | ORAL_TABLET | ORAL | Status: DC | PRN

## 2017-08-13 MED ORDER — HYDROMORPHONE HCL 1 MG/ML IJ SOLN
0.2500 mg | INTRAMUSCULAR | Status: DC | PRN
  Administered 2017-08-13 (×4): 0.5 mg via INTRAVENOUS

## 2017-08-13 MED ORDER — BUPIVACAINE HCL (PF) 0.5 % IJ SOLN
INTRAMUSCULAR | Status: AC
  Filled 2017-08-13: qty 30

## 2017-08-13 MED ORDER — ROCURONIUM BROMIDE 10 MG/ML (PF) SYRINGE
PREFILLED_SYRINGE | INTRAVENOUS | Status: AC
  Filled 2017-08-13: qty 5

## 2017-08-13 MED ORDER — LIDOCAINE 2% (20 MG/ML) 5 ML SYRINGE
INTRAMUSCULAR | Status: AC
Start: 2017-08-13 — End: 2017-08-13
  Filled 2017-08-13: qty 5

## 2017-08-13 MED ORDER — LACTATED RINGERS IV SOLN: INTRAVENOUS | Status: DC

## 2017-08-13 MED ORDER — LIDOCAINE HCL (CARDIAC) 20 MG/ML IV SOLN
INTRAVENOUS | Status: DC | PRN
  Administered 2017-08-13: 60 mg via INTRAVENOUS

## 2017-08-13 MED ORDER — THROMBIN 5000 UNITS EX SOLR
CUTANEOUS | Status: DC | PRN
  Administered 2017-08-13 (×2): via TOPICAL

## 2017-08-13 MED ORDER — MEPERIDINE HCL 25 MG/ML IJ SOLN: 6.2500 mg | INTRAMUSCULAR | Status: DC | PRN

## 2017-08-13 MED ORDER — CHLORHEXIDINE GLUCONATE CLOTH 2 % EX PADS: 6.0000 | MEDICATED_PAD | Freq: Once | CUTANEOUS | Status: DC

## 2017-08-13 MED ORDER — SODIUM CHLORIDE 0.9 % IV SOLN: 250.0000 mL | INTRAVENOUS | Status: DC

## 2017-08-13 MED ORDER — PROPOFOL 10 MG/ML IV BOLUS
INTRAVENOUS | Status: DC | PRN
Start: 2017-08-13 — End: 2017-08-13
  Administered 2017-08-13: 130 mg via INTRAVENOUS

## 2017-08-13 MED ORDER — ONDANSETRON HCL 4 MG/2ML IJ SOLN
INTRAMUSCULAR | Status: DC | PRN
  Administered 2017-08-13: 4 mg via INTRAVENOUS

## 2017-08-13 MED ORDER — KETOROLAC TROMETHAMINE 15 MG/ML IJ SOLN
7.5000 mg | Freq: Four times a day (QID) | INTRAMUSCULAR | Status: AC
  Administered 2017-08-13 – 2017-08-14 (×4): 7.5 mg via INTRAVENOUS
  Filled 2017-08-13 (×4): qty 1

## 2017-08-13 MED ORDER — CEFAZOLIN SODIUM-DEXTROSE 2-4 GM/100ML-% IV SOLN
2.0000 g | INTRAVENOUS | Status: AC
  Administered 2017-08-13: 2 g via INTRAVENOUS
  Filled 2017-08-13: qty 100

## 2017-08-13 MED ORDER — LIDOCAINE-EPINEPHRINE 1 %-1:100000 IJ SOLN
INTRAMUSCULAR | Status: DC | PRN
  Administered 2017-08-13: 5 mL

## 2017-08-13 MED ORDER — SODIUM CHLORIDE 0.9% FLUSH: 3.0000 mL | INTRAVENOUS | Status: DC | PRN

## 2017-08-13 MED ORDER — MIDAZOLAM HCL 2 MG/2ML IJ SOLN
INTRAMUSCULAR | Status: AC
  Filled 2017-08-13: qty 2

## 2017-08-13 MED ORDER — DOCUSATE SODIUM 100 MG PO CAPS
100.0000 mg | ORAL_CAPSULE | Freq: Two times a day (BID) | ORAL | Status: DC
  Administered 2017-08-13 – 2017-08-14 (×2): 100 mg via ORAL
  Filled 2017-08-13 (×2): qty 1

## 2017-08-13 MED ORDER — SODIUM CHLORIDE 0.9% FLUSH: 3.0000 mL | Freq: Two times a day (BID) | INTRAVENOUS | Status: DC

## 2017-08-13 MED ORDER — SUGAMMADEX SODIUM 200 MG/2ML IV SOLN
INTRAVENOUS | Status: AC
  Filled 2017-08-13: qty 2

## 2017-08-13 MED ORDER — PHENYLEPHRINE HCL 10 MG/ML IJ SOLN
INTRAMUSCULAR | Status: DC | PRN
  Administered 2017-08-13: 120 ug via INTRAVENOUS
  Administered 2017-08-13: 160 ug via INTRAVENOUS
  Administered 2017-08-13: 120 ug via INTRAVENOUS

## 2017-08-13 MED ORDER — MENTHOL 3 MG MT LOZG: 1.0000 | LOZENGE | OROMUCOSAL | Status: DC | PRN

## 2017-08-13 MED ORDER — THROMBIN 20000 UNITS EX SOLR
CUTANEOUS | Status: AC
Start: 2017-08-13 — End: 2017-08-13
  Filled 2017-08-13: qty 20000

## 2017-08-13 MED ORDER — EPHEDRINE SULFATE 50 MG/ML IJ SOLN
INTRAMUSCULAR | Status: DC | PRN
  Administered 2017-08-13: 10 mg via INTRAVENOUS

## 2017-08-13 MED ORDER — METHOCARBAMOL 1000 MG/10ML IJ SOLN
500.0000 mg | Freq: Four times a day (QID) | INTRAMUSCULAR | Status: DC | PRN
  Filled 2017-08-13: qty 5

## 2017-08-13 MED ORDER — ACETAMINOPHEN 650 MG RE SUPP: 650.0000 mg | RECTAL | Status: DC | PRN

## 2017-08-13 MED ORDER — PHENOL 1.4 % MT LIQD: 1.0000 | OROMUCOSAL | Status: DC | PRN

## 2017-08-13 MED ORDER — 0.9 % SODIUM CHLORIDE (POUR BTL) OPTIME
TOPICAL | Status: DC | PRN
Start: 2017-08-13 — End: 2017-08-13
  Administered 2017-08-13: 1000 mL

## 2017-08-13 MED ORDER — DEXAMETHASONE SODIUM PHOSPHATE 10 MG/ML IJ SOLN
INTRAMUSCULAR | Status: DC | PRN
  Administered 2017-08-13: 10 mg via INTRAVENOUS

## 2017-08-13 MED ORDER — THROMBIN 20000 UNITS EX SOLR
CUTANEOUS | Status: DC | PRN
  Administered 2017-08-13: 09:00:00 via TOPICAL

## 2017-08-13 MED ORDER — BUPIVACAINE HCL (PF) 0.5 % IJ SOLN
INTRAMUSCULAR | Status: DC | PRN
  Administered 2017-08-13: 5 mL

## 2017-08-13 MED ORDER — ONDANSETRON HCL 4 MG PO TABS: 4.0000 mg | ORAL_TABLET | Freq: Four times a day (QID) | ORAL | Status: DC | PRN

## 2017-08-13 MED ORDER — LACTATED RINGERS IV SOLN
INTRAVENOUS | Status: DC | PRN
  Administered 2017-08-13 (×2): via INTRAVENOUS

## 2017-08-13 MED ORDER — ONDANSETRON HCL 4 MG/2ML IJ SOLN: 4.0000 mg | Freq: Four times a day (QID) | INTRAMUSCULAR | Status: DC | PRN

## 2017-08-13 SURGICAL SUPPLY — 75 items
ADH SKN CLS APL DERMABOND .7 (GAUZE/BANDAGES/DRESSINGS) ×1
APL SKNCLS STERI-STRIP NONHPOA (GAUZE/BANDAGES/DRESSINGS)
BAG DECANTER FOR FLEXI CONT (MISCELLANEOUS) ×2 IMPLANT
BASKET BONE COLLECTION (BASKET) ×2 IMPLANT
BENZOIN TINCTURE PRP APPL 2/3 (GAUZE/BANDAGES/DRESSINGS) IMPLANT
BIT DRILL 3.5 POWEREASE (BIT) ×1 IMPLANT
BLADE CLIPPER SURG (BLADE) IMPLANT
BLADE SURG 11 STRL SS (BLADE) ×2 IMPLANT
BUR MATCHSTICK NEURO 3.0 LAGG (BURR) ×2 IMPLANT
BUR PRECISION FLUTE 5.0 (BURR) ×2 IMPLANT
CANISTER SUCT 3000ML PPV (MISCELLANEOUS) ×2 IMPLANT
CARTRIDGE OIL MAESTRO DRILL (MISCELLANEOUS) ×1 IMPLANT
CONT SPEC 4OZ CLIKSEAL STRL BL (MISCELLANEOUS) ×2 IMPLANT
COVER BACK TABLE 60X90IN (DRAPES) ×2 IMPLANT
DECANTER SPIKE VIAL GLASS SM (MISCELLANEOUS) ×2 IMPLANT
DERMABOND ADVANCED (GAUZE/BANDAGES/DRESSINGS) ×1
DERMABOND ADVANCED .7 DNX12 (GAUZE/BANDAGES/DRESSINGS) ×1 IMPLANT
DEVICE INTERBODY ELEVATE 23X7 (Cage) ×2 IMPLANT
DIFFUSER DRILL AIR PNEUMATIC (MISCELLANEOUS) ×2 IMPLANT
DRAPE C-ARM 42X72 X-RAY (DRAPES) ×2 IMPLANT
DRAPE C-ARMOR (DRAPES) ×2 IMPLANT
DRAPE LAPAROTOMY 100X72X124 (DRAPES) ×2 IMPLANT
DRAPE POUCH INSTRU U-SHP 10X18 (DRAPES) ×2 IMPLANT
DRAPE SURG 17X23 STRL (DRAPES) ×2 IMPLANT
DRSG OPSITE POSTOP 4X6 (GAUZE/BANDAGES/DRESSINGS) ×1 IMPLANT
DURAPREP 26ML APPLICATOR (WOUND CARE) ×2 IMPLANT
ELECT REM PT RETURN 9FT ADLT (ELECTROSURGICAL) ×2
ELECTRODE REM PT RTRN 9FT ADLT (ELECTROSURGICAL) ×1 IMPLANT
GAUZE SPONGE 4X4 12PLY STRL (GAUZE/BANDAGES/DRESSINGS) IMPLANT
GAUZE SPONGE 4X4 16PLY XRAY LF (GAUZE/BANDAGES/DRESSINGS) IMPLANT
GLOVE BIO SURGEON STRL SZ7 (GLOVE) IMPLANT
GLOVE BIO SURGEON STRL SZ7.5 (GLOVE) ×2 IMPLANT
GLOVE BIOGEL PI IND STRL 7.0 (GLOVE) IMPLANT
GLOVE BIOGEL PI IND STRL 7.5 (GLOVE) ×1 IMPLANT
GLOVE BIOGEL PI INDICATOR 7.0 (GLOVE) ×1
GLOVE BIOGEL PI INDICATOR 7.5 (GLOVE) ×2
GLOVE ECLIPSE 7.0 STRL STRAW (GLOVE) ×2 IMPLANT
GLOVE EXAM NITRILE LRG STRL (GLOVE) IMPLANT
GLOVE EXAM NITRILE XL STR (GLOVE) IMPLANT
GLOVE EXAM NITRILE XS STR PU (GLOVE) IMPLANT
GOWN STRL REUS W/ TWL LRG LVL3 (GOWN DISPOSABLE) ×2 IMPLANT
GOWN STRL REUS W/ TWL XL LVL3 (GOWN DISPOSABLE) IMPLANT
GOWN STRL REUS W/TWL 2XL LVL3 (GOWN DISPOSABLE) IMPLANT
GOWN STRL REUS W/TWL LRG LVL3 (GOWN DISPOSABLE) ×4
GOWN STRL REUS W/TWL XL LVL3 (GOWN DISPOSABLE)
HEMOSTAT POWDER KIT SURGIFOAM (HEMOSTASIS) ×2 IMPLANT
KIT BASIN OR (CUSTOM PROCEDURE TRAY) ×2 IMPLANT
KIT INFUSE XX SMALL 0.7CC (Orthopedic Implant) ×1 IMPLANT
KIT POSITION SURG JACKSON T1 (MISCELLANEOUS) ×2 IMPLANT
KIT ROOM TURNOVER OR (KITS) ×2 IMPLANT
NDL HYPO 18GX1.5 BLUNT FILL (NEEDLE) IMPLANT
NDL SPNL 18GX3.5 QUINCKE PK (NEEDLE) IMPLANT
NEEDLE HYPO 18GX1.5 BLUNT FILL (NEEDLE) IMPLANT
NEEDLE HYPO 22GX1.5 SAFETY (NEEDLE) ×2 IMPLANT
NEEDLE SPNL 18GX3.5 QUINCKE PK (NEEDLE) ×2 IMPLANT
NS IRRIG 1000ML POUR BTL (IV SOLUTION) ×2 IMPLANT
OIL CARTRIDGE MAESTRO DRILL (MISCELLANEOUS) ×2
PACK LAMINECTOMY NEURO (CUSTOM PROCEDURE TRAY) ×2 IMPLANT
PAD ARMBOARD 7.5X6 YLW CONV (MISCELLANEOUS) ×6 IMPLANT
ROD COBALT 47.5X35 (Rod) ×2 IMPLANT
SCREW 5.5X35MM (Screw) ×8 IMPLANT
SCREW BN 35X5.5XMA NS SPNE (Screw) IMPLANT
SCREW SET SOLERA (Screw) ×8 IMPLANT
SCREW SET SOLERA TI (Screw) IMPLANT
SPONGE LAP 4X18 X RAY DECT (DISPOSABLE) IMPLANT
SPONGE SURGIFOAM ABS GEL 100 (HEMOSTASIS) ×2 IMPLANT
STRIP CLOSURE SKIN 1/2X4 (GAUZE/BANDAGES/DRESSINGS) IMPLANT
SUT VIC AB 0 CT1 18XCR BRD8 (SUTURE) ×1 IMPLANT
SUT VIC AB 0 CT1 8-18 (SUTURE) ×4
SUT VICRYL 3-0 RB1 18 ABS (SUTURE) ×3 IMPLANT
SYR 3ML LL SCALE MARK (SYRINGE) ×3 IMPLANT
TOWEL GREEN STERILE (TOWEL DISPOSABLE) ×2 IMPLANT
TOWEL GREEN STERILE FF (TOWEL DISPOSABLE) ×2 IMPLANT
TRAY FOLEY W/METER SILVER 16FR (SET/KITS/TRAYS/PACK) ×2 IMPLANT
WATER STERILE IRR 1000ML POUR (IV SOLUTION) ×2 IMPLANT

## 2017-08-13 NOTE — Op Note (Signed)
PREOP DIAGNOSIS:  1. Lumbar stenosis with claudication 2. Spondylolisthesis, L3-4  POSTOP DIAGNOSIS: Same  PROCEDURE: 1. L3 laminectomy with facetectomy for decompression of exiting nerve roots, more than would be required for placement of interbody graft 2. Placement of anterior interbody device - Medtrnoic expandable 73mm cage x2 3. Posterior instrumentation using cortical pedicle screws at L3 - L4 4. Interbody arthrodesis, L3-4 5. Use of locally harvested bone autograft 6. Use of non-structural bone allograft - BMP  SURGEON: Dr. Lisbeth Renshaw, MD  ASSISTANT: Cindra Presume, PA-C  ANESTHESIA: General Endotracheal  EBL: 500cc  SPECIMENS: None  DRAINS: None  COMPLICATIONS: None immediate  CONDITION: Hemodynamically stable to PACU  HISTORY: Taylor Gay is a 71 y.o. female who has been followed in the outpatient clinic with back and leg pain related to spondylosis, spondylolisthesis, and stenosis at L3-4. She attempted multiple conservative treatments and ultimately elected to proceed with surgical decompression and fusion. Risks and benefits were reviewed and consent was obtained.  PROCEDURE IN DETAIL: After informed consent was obtained and witnessed, the patient was brought to the operating room. After induction of general anesthesia, the patient was positioned on the operative table in the prone position. All pressure points were meticulously padded. Incision was then marked out and prepped and draped in the usual sterile fashion.  After timeout was conducted, skin was infiltrated with local anesthetic. Skin incision was then made sharply and Bovie electrocautery was used to dissect the subcutaneous tissue until the lumbodorsal fascia was identified and incised. The muscle was then elevated in the subperiosteal plane and the L3 lamina, inferior L3-4 and superior L2-3 facet complexes were identified. Self-retaining retractors were then placed.  Using intraoperative  fluoroscopy, entry points for bilateral L3 cortical pedicle screws were identified. Pilot holes were then created under lateral fluoroscopy and tapped to 5.5 x 35 mm.  At this point attention was turned to decompression. Complete L3 laminectomy with facetectomy was completed using a combination of Kerrison rongeurs and a high-speed drill. Good decompression of the exiting and traversing roots was confirmed.  Disc space was then identified, incised, and using a combination of shavers, curettes and rongeurs, complete discectomy was completed. Endplates were prepared, and a 62mm cage was tapped into place bilaterally.  Bone harvested during decompression was mixed with BMP and packed into the interspace prior to cage insertion. Good position was confirmed with fluoroscopy.  At this point, the L4 cortical pedicle screws were drilled and tapped to 5.5 mm. Screws were then placed in L3 and L4. Rod was then placed, set screws placed and final tightened. Final AP and lateral fluoroscopic images confirmed good position.  The wound was then irrigated with copious amounts of antibiotic saline, then closed in standard fashion using a combination of interrupted 0 and 3-0 Vicryl stitches in the muscular, fascial, and subcutaneous layers. Skin was then closed using standard Dermabond. Sterile dressing was then applied. The patient was then transferred to the stretcher, extubated, and taken to the postanesthesia care unit in stable hemodynamic condition.  At the end of the case all sponge, needle, cottonoid, and instrument counts were correct.

## 2017-08-13 NOTE — Transfer of Care (Signed)
Immediate Anesthesia Transfer of Care Note  Patient: Taylor Gay  Procedure(s) Performed: Procedure(s) with comments: POSTERIOR LUMBAR INTERBODY FUSION LUMBAR THREE- LUMBAR FOUR (N/A) - POSTERIOR LUMBAR INTERBODY FUSION LUMBAR 3- LUMBAR 4  Patient Location: PACU  Anesthesia Type:General  Level of Consciousness: awake and patient cooperative  Airway & Oxygen Therapy: Patient Spontanous Breathing  Post-op Assessment: Report given to RN and Post -op Vital signs reviewed and stable  Post vital signs: Reviewed and stable  Last Vitals:  Vitals:   08/13/17 0549 08/13/17 1110  BP: (!) 154/97 (!) 154/96  Pulse:  (!) 104  Resp:  19  Temp:  36.5 C  SpO2:  95%    Last Pain:  Vitals:   08/13/17 0606  TempSrc:   PainSc: 9          Complications: No apparent anesthesia complications

## 2017-08-13 NOTE — Evaluation (Signed)
Physical Therapy Evaluation Patient Details Name: Taylor Gay MRN: [id number] DOB: 1946/08/10 Today's Date: 08/13/2017   History of Present Illness  Pt is a 71 y/o female s/p L3-4 PLIF. PMH includes anxiety, HTN, neuropathy, SOB, pre-diabetes, s/p ORIF of L ankle, and R TKA.   Clinical Impression  Pt s/p surgery above with deficits below. PTA, pt was independent with use of RW, however, did report she had multiple falls in the last 6 months secondary to her legs giving out on her. Upon eval, pt limited by decreased strength, post op pain, decreased sensation, and decreased balance. Required min guard assist for mobility this session. Reports her son can assist intermittently, but has friends and neighbors that can check on her throughout the day. No follow up recommendations at this time, however, pt would benefit from outpatient PT once cleared. Will continue to follow acutely to maximize functional mobility independence and safety.     Follow Up Recommendations No PT follow up;Supervision for mobility/OOB (would benefit from OP PT once cleared )    Equipment Recommendations  None recommended by PT    Recommendations for Other Services       Precautions / Restrictions Precautions Precautions: Back Precaution Booklet Issued: Yes (comment) Precaution Comments: Reviewed back precaution handout with pt.  Required Braces or Orthoses: Spinal Brace Spinal Brace: Lumbar corset;Applied in sitting position Restrictions Weight Bearing Restrictions: No      Mobility  Bed Mobility Overal bed mobility: Needs Assistance Bed Mobility: Rolling;Sidelying to Sit;Sit to Sidelying Rolling: Min guard Sidelying to sit: Min guard     Sit to sidelying: Min guard General bed mobility comments: Min guard assist to ensure appropriate log roll technique.   Transfers Overall transfer level: Needs assistance Equipment used: Rolling walker (2 wheeled) Transfers: Sit to/from Stand Sit to Stand: Min  guard         General transfer comment: Min guard for safety. Required 2 attempts to stand. Verbal cues for safe hand placement.   Ambulation/Gait Ambulation/Gait assistance: Min guard Ambulation Distance (Feet): 100 Feet Assistive device: Rolling walker (2 wheeled) Gait Pattern/deviations: Step-through pattern;Decreased stride length Gait velocity: Decreased Gait velocity interpretation: Below normal speed for age/gender General Gait Details: Slow, overall steady gait. REquired continuous cues for upright posture and proximity to device. Educated about generalized walking program to perform at home.  Stairs            Wheelchair Mobility    Modified Rankin (Stroke Patients Only)       Balance Overall balance assessment: Needs assistance Sitting-balance support: No upper extremity supported;Feet unsupported Sitting balance-Leahy Scale: Good     Standing balance support: Bilateral upper extremity supported;During functional activity Standing balance-Leahy Scale: Poor Standing balance comment: Reliant on RW for stability                              Pertinent Vitals/Pain Pain Assessment: 0-10 Pain Score: 10-Worst pain ever Pain Location: back  Pain Descriptors / Indicators: Aching;Operative site guarding Pain Intervention(s): Limited activity within patient's tolerance;Monitored during session;Repositioned    Home Living Family/patient expects to be discharged to:: Private residence Living Arrangements: Children Available Help at Discharge: Family;Friend(s);Available 24 hours/day Type of Home: Apartment Home Access: Stairs to enter (Has the option to walk around the bulding to a level entry. ) Entrance Stairs-Rails: Right Entrance Stairs-Number of Steps: 8 Home Layout: One level Home Equipment: Walker - 2 wheels;Cane - single point;Bedside commode  Prior Function Level of Independence: Independent with assistive device(s)          Comments: USed RW at baseline.      Hand Dominance        Extremity/Trunk Assessment   Upper Extremity Assessment Upper Extremity Assessment: Defer to OT evaluation    Lower Extremity Assessment Lower Extremity Assessment: RLE deficits/detail;LLE deficits/detail;Generalized weakness RLE Deficits / Details: neuropathy in feet LLE Deficits / Details: Neuropathy in feet and numbness in shin secondary operation.     Cervical / Trunk Assessment Cervical / Trunk Assessment: Other exceptions Cervical / Trunk Exceptions: s/p PLIF   Communication   Communication: No difficulties  Cognition Arousal/Alertness: Awake/alert Behavior During Therapy: WFL for tasks assessed/performed Overall Cognitive Status: Within Functional Limits for tasks assessed                                        General Comments General comments (skin integrity, edema, etc.): Pt reports multiple falls in the past 6 months. Reports her legs give out on her and she just goes down. Son present during session.     Exercises     Assessment/Plan    PT Assessment Patient needs continued PT services  PT Problem List Decreased strength;Decreased balance;Decreased mobility;Decreased knowledge of use of DME;Decreased knowledge of precautions;Pain       PT Treatment Interventions DME instruction;Gait training;Stair training;Functional mobility training;Therapeutic activities;Therapeutic exercise;Balance training;Neuromuscular re-education;Patient/family education    PT Goals (Current goals can be found in the Care Plan section)  Acute Rehab PT Goals Patient Stated Goal: to decrease pain  PT Goal Formulation: With patient Time For Goal Achievement: 08/20/17 Potential to Achieve Goals: Good    Frequency Min 5X/week   Barriers to discharge        Co-evaluation               AM-PAC PT "6 Clicks" Daily Activity  Outcome Measure Difficulty turning over in bed (including adjusting  bedclothes, sheets and blankets)?: A Little Difficulty moving from lying on back to sitting on the side of the bed? : A Little Difficulty sitting down on and standing up from a chair with arms (e.g., wheelchair, bedside commode, etc,.)?: Unable Help needed moving to and from a bed to chair (including a wheelchair)?: A Little Help needed walking in hospital room?: A Little Help needed climbing 3-5 steps with a railing? : A Lot 6 Click Score: 15    End of Session Equipment Utilized During Treatment: Gait belt;Back brace Activity Tolerance: Patient tolerated treatment well Patient left: in bed;with call bell/phone within reach;with family/visitor present Nurse Communication: Mobility status PT Visit Diagnosis: Other abnormalities of gait and mobility (R26.89);Pain Pain - part of body:  (back )    Time: 1610-9604 PT Time Calculation (min) (ACUTE ONLY): 23 min   Charges:   PT Evaluation $PT Eval Low Complexity: 1 Low PT Treatments $Gait Training: 8-22 mins   PT G Codes:        Gladys Damme, PT, DPT  Acute Rehabilitation Services  Pager: [phone]   Lehman Prom 08/13/2017, 4:31 PM

## 2017-08-13 NOTE — Anesthesia Procedure Notes (Signed)
Procedure Name: Intubation Date/Time: 08/13/2017 8:01 AM Performed by: Rosiland Oz Pre-anesthesia Checklist: Patient identified, Suction available, Emergency Drugs available, Patient being monitored and Timeout performed Patient Re-evaluated:Patient Re-evaluated prior to induction Oxygen Delivery Method: Circle system utilized Preoxygenation: Pre-oxygenation with 100% oxygen Induction Type: IV induction Ventilation: Mask ventilation without difficulty Laryngoscope Size: Miller and 3 Grade View: Grade I Tube type: Oral Tube size: 7.0 mm Number of attempts: 1 Airway Equipment and Method: Stylet Placement Confirmation: ETT inserted through vocal cords under direct vision,  positive ETCO2 and breath sounds checked- equal and bilateral Secured at: 21 cm Tube secured with: Tape Dental Injury: Teeth and Oropharynx as per pre-operative assessment

## 2017-08-13 NOTE — Anesthesia Postprocedure Evaluation (Signed)
Anesthesia Post Note  Patient: Taylor Gay  Procedure(s) Performed: Procedure(s) (LRB): POSTERIOR LUMBAR INTERBODY FUSION LUMBAR THREE- LUMBAR FOUR (N/A)     Patient location during evaluation: PACU Anesthesia Type: General Level of consciousness: awake and alert Pain management: pain level controlled Vital Signs Assessment: post-procedure vital signs reviewed and stable Respiratory status: spontaneous breathing, nonlabored ventilation, respiratory function stable and patient connected to nasal cannula oxygen Cardiovascular status: blood pressure returned to baseline and stable Postop Assessment: no signs of nausea or vomiting Anesthetic complications: no    Last Vitals:  Vitals:   08/13/17 1155 08/13/17 1229  BP: (!) 143/65 (!) 155/67  Pulse: 91 80  Resp: 19 20  Temp: 36.6 C 36.8 C  SpO2: 95% 96%    Last Pain:  Vitals:   08/13/17 1216  TempSrc:   PainSc: 3                  Shelton Silvas

## 2017-08-13 NOTE — Progress Notes (Signed)
PT Cancellation Note  Patient Details Name: Taylor Gay MRN: [id number] DOB: [birth year]   Cancelled Treatment:    Reason Eval/Treat Not Completed: Fatigue/lethargy limiting ability to participate  RN requesting to hold until later, as pt very sleepy. Will reattempt as schedule allows.   Gladys Damme, PT, DPT  Acute Rehabilitation Services  Pager: [phone]   Lehman Prom 08/13/2017, 1:40 PM
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
## 2017-08-14 ENCOUNTER — Encounter: Payer: Self-pay | Admitting: Family Medicine

## 2017-08-14 LAB — BASIC METABOLIC PANEL
ANION GAP: 11 (ref 5–15)
BUN: 23 mg/dL — ABNORMAL HIGH (ref 6–20)
CO2: 27 mmol/L (ref 22–32)
Calcium: 8.3 mg/dL — ABNORMAL LOW (ref 8.9–10.3)
Chloride: 97 mmol/L — ABNORMAL LOW (ref 101–111)
Creatinine, Ser: 1.13 mg/dL — ABNORMAL HIGH (ref 0.44–1.00)
GFR calc Af Amer: 56 mL/min — ABNORMAL LOW (ref >60)
GFR calc non Af Amer: 48 mL/min — ABNORMAL LOW (ref >60)
GLUCOSE: 155 mg/dL — AB (ref 65–99)
POTASSIUM: 3.3 mmol/L — AB (ref 3.5–5.1)
Sodium: 135 mmol/L (ref 135–145)

## 2017-08-14 LAB — CBC
HEMATOCRIT: 29.8 % — AB (ref 36.0–46.0)
Hemoglobin: 10 g/dL — ABNORMAL LOW (ref 12.0–15.0)
MCH: 28.7 pg (ref 26.0–34.0)
MCHC: 33.6 g/dL (ref 30.0–36.0)
MCV: 85.6 fL (ref 78.0–100.0)
Platelets: 216 10*3/uL (ref 150–400)
RBC: 3.48 MIL/uL — AB (ref 3.87–5.11)
RDW: 13.8 % (ref 11.5–15.5)
WBC: 17.9 10*3/uL — AB (ref 4.0–10.5)

## 2017-08-14 MED ORDER — OXYCODONE HCL 5 MG PO TABS: 5.0000 mg | ORAL_TABLET | ORAL | 0 refills | Status: DC | PRN

## 2017-08-14 MED ORDER — METHOCARBAMOL 500 MG PO TABS: 500.0000 mg | ORAL_TABLET | Freq: Four times a day (QID) | ORAL | 2 refills | Status: DC | PRN

## 2017-08-14 NOTE — Progress Notes (Signed)
Physical Therapy Treatment Patient Details Name: Taylor Gay MRN: [id number] DOB: 1946/02/04 Today's Date: 08/14/2017    History of Present Illness Pt is a 71 y.o. female s/p L3-4 PLIF. PMH includes but not limited to anxiety, depression, HTN, neuropathy, SOB, pre-diabetes, s/p ORIF of L ankle, and R TKA.     PT Comments    Pt demonstrates good tolerance for gait this session. Pain is slightly increased as distance increases to almost 10/10 per patient. Pt maintains improved proximity to RW with gait this session. Recalls 3/3 precautions. Pt will benefit from continued acute follow-up while admitted.     Follow Up Recommendations  No PT follow up;Supervision for mobility/OOB     Equipment Recommendations  None recommended by PT    Recommendations for Other Services       Precautions / Restrictions Precautions Precautions: Back;Fall Precaution Booklet Issued: Yes (comment) Precaution Comments: reviewed back precautions Required Braces or Orthoses: Spinal Brace Spinal Brace: Lumbar corset;Applied in sitting position Restrictions Weight Bearing Restrictions: No    Mobility  Bed Mobility Overal bed mobility: Needs Assistance Bed Mobility: Rolling;Sidelying to Sit;Sit to Sidelying Rolling: Supervision (OT at edge of bed for safety) Sidelying to sit: Supervision     Sit to sidelying: Supervision General bed mobility comments: pt standing up in room when PT arrives  Transfers Overall transfer level: Needs assistance Equipment used: Rolling walker (2 wheeled) Transfers: Sit to/from Stand Sit to Stand: Supervision         General transfer comment: supervison for safety with cues for hand placement when sitting  Ambulation/Gait Ambulation/Gait assistance: Supervision Ambulation Distance (Feet): 500 Feet Assistive device: Rolling walker (2 wheeled) Gait Pattern/deviations: Step-through pattern;Decreased stride length Gait velocity: Decreased Gait velocity  interpretation: Below normal speed for age/gender General Gait Details: slow gait pattern. Cues for proximity to RW throughout gait.    Stairs            Wheelchair Mobility    Modified Rankin (Stroke Patients Only)       Balance Overall balance assessment: Needs assistance Sitting-balance support: No upper extremity supported;Feet unsupported Sitting balance-Leahy Scale: Good     Standing balance support: During functional activity;No upper extremity supported Standing balance-Leahy Scale: Fair                              Cognition Arousal/Alertness: Awake/alert Behavior During Therapy: WFL for tasks assessed/performed Overall Cognitive Status: Within Functional Limits for tasks assessed                                        Exercises      General Comments        Pertinent Vitals/Pain Pain Assessment: 0-10 Pain Score: 4  Pain Location: back  Pain Descriptors / Indicators: Aching;Throbbing;Stabbing;Sore Pain Intervention(s): Monitored during session;Premedicated before session    Home Living Family/patient expects to be discharged to:: Private residence Living Arrangements: Children Available Help at Discharge: Family;Friend(s);Available PRN/intermittently Type of Home: Apartment Home Access: Stairs to enter (has the option to walk around the building to a level entry) Entrance Stairs-Rails: Right Home Layout: One level Home Equipment: Walker - 2 wheels;Bedside commode;Adaptive equipment (has a hurry cane )      Prior Function Level of Independence: Independent with assistive device(s)      Comments: Used RW at baseline.    PT Goals (  current goals can now be found in the care plan section) Acute Rehab PT Goals Patient Stated Goal: to decrease pain  Progress towards PT goals: Progressing toward goals    Frequency    Min 5X/week      PT Plan Current plan remains appropriate    Co-evaluation               AM-PAC PT "6 Clicks" Daily Activity  Outcome Measure  Difficulty turning over in bed (including adjusting bedclothes, sheets and blankets)?: None Difficulty moving from lying on back to sitting on the side of the bed? : None Difficulty sitting down on and standing up from a chair with arms (e.g., wheelchair, bedside commode, etc,.)?: None Help needed moving to and from a bed to chair (including a wheelchair)?: A Little Help needed walking in hospital room?: A Little Help needed climbing 3-5 steps with a railing? : A Little 6 Click Score: 21    End of Session Equipment Utilized During Treatment: Gait belt;Back brace Activity Tolerance: Patient tolerated treatment well Patient left: in chair;with call bell/phone within reach;with nursing/sitter in room Nurse Communication: Mobility status PT Visit Diagnosis: Other abnormalities of gait and mobility (R26.89);Pain Pain - part of body:  (back)     Time: 5809-9833 PT Time Calculation (min) (ACUTE ONLY): 10 min  Charges:  $Gait Training: 8-22 mins                    G Codes:       Colin Broach PT, DPT  [phone]    [name] 08/14/2017, 9:00 AM

## 2017-08-14 NOTE — Discharge Summary (Signed)
Physician Discharge Summary  Patient ID: Taylor Gay MRN: [id number] DOB/AGE: [birth year] 71 y.o.  Admit date: 08/13/2017 Discharge date: 08/14/2017  Admission Diagnoses:  Lumbar spondylolisthesis  Discharge Diagnoses:  Same Active Problems:   Spondylolisthesis at L3-L4 level   Discharged Condition: Stable  Hospital Course:  Taylor Gay is a 71 y.o. female who was admitted for the below procedure. There were no post operative complications. At time of discharge, pain was well controlled, ambulating with Pt/OT, tolerating po, voiding normal. Ready for discharge.  Treatments: Surgery 1. L3 laminectomy with facetectomy for decompression of exiting nerve roots, more than would be required for placement of interbody graft 2. Placement of anterior interbody device - Medtrnoic expandable 7mm cage x2 3. Posterior instrumentation using cortical pedicle screws at L3 - L4 4. Interbody arthrodesis, L3-4 5. Use of locally harvested bone autograft 6. Use of non-structural bone allograft - BMP  Discharge Exam: Blood pressure 124/66, pulse 71, temperature 98.3 F (36.8 C), temperature source Oral, resp. rate 20, weight 98.4 kg (217 lb), SpO2 97 %. Awake, alert, oriented Speech fluent, appropriate CN grossly intact 5/5 BUE/BLE Wound c/d/i  Disposition: 01-Home or Self Care  Discharge Instructions    Call MD for:  difficulty breathing, headache or visual disturbances    Complete by:  As directed    Call MD for:  persistant dizziness or light-headedness    Complete by:  As directed    Call MD for:  redness, tenderness, or signs of infection (pain, swelling, redness, odor or green/yellow discharge around incision site)    Complete by:  As directed    Call MD for:  severe uncontrolled pain    Complete by:  As directed    Call MD for:  temperature >100.4    Complete by:  As directed    Diet general    Complete by:  As directed    Driving Restrictions    Complete by:  As directed     Do not drive until given clearance.   Increase activity slowly    Complete by:  As directed    Lifting restrictions    Complete by:  As directed    Do not lift anything >10lbs. Avoid bending and twisting in awkward positions. Avoid bending at the back.   May shower / Bathe    Complete by:  As directed    In 24 hours. Okay to wash wound with warm soapy water. Avoid scrubbing the wound. Pat dry.   Remove dressing in 24 hours    Complete by:  As directed      Allergies as of 08/14/2017      Reactions   Amitriptyline Hcl Shortness Of Breath, Other (See Comments)   REACTION: SOB, could not move, felt like her heart stopped, felt like she was dying    Lyrica [pregabalin] Anaphylaxis, Swelling   Neomycin-bacitracin Zn-polymyx Anaphylaxis, Shortness Of Breath, Swelling   Went to the dermatologist and had something removed and the dermatologist put neosporin on the place he removed and pt's neck and throat started swelling and she was short of breath. Told to never use again, told she was allergic to the polysporin in neosporin, pt uses mupirocin ointment    Polysporin [bacitracin-polymyxin B] Anaphylaxis, Shortness Of Breath, Swelling   Went to the dermatologist and had something removed and the dermatologist put neosporin on the place he removed and pt's neck and throat started swelling and she was short of breath. Told to never use again, told  she was allergic to the polysporin in neosporin, pt uses mupirocin ointment    Flexeril [cyclobenzaprine Hcl] Other (See Comments)   Hangover feelings   Morphine Other (See Comments)   Chest pain  ...can take hydrocodone    Rabeprazole Sodium Swelling   SWELLING REACTION UNSPECIFIED    Codeine Other (See Comments)   Makes her feel really bad, like a really bad hangover    Flexeril [cyclobenzaprine] Nausea Only, Other (See Comments)   "Hangover" feeling   Omeprazole Nausea And Vomiting   UPSET STOMACH   Pantoprazole Sodium Diarrhea   Tramadol  Nausea Only, Other (See Comments)    "Hangover"  "Doesn't help the pain "      Medication List    STOP taking these medications   baclofen 10 MG tablet Commonly known as:  LIORESAL   oxyCODONE-acetaminophen 5-325 MG tablet Commonly known as:  PERCOCET/ROXICET     TAKE these medications   DULoxetine 60 MG capsule Commonly known as:  CYMBALTA Take 1 capsule (60 mg total) by mouth daily.   GOODY HEADACHE PO Take 1 Package by mouth 5 (five) times daily as needed (HEADACHE).   methocarbamol 500 MG tablet Commonly known as:  ROBAXIN Take 1 tablet (500 mg total) by mouth every 6 (six) hours as needed for muscle spasms.   mupirocin ointment 2 % Commonly known as:  BACTROBAN Place 1 application into the nose 2 (two) times daily. What changed:  when to take this  reasons to take this   oxyCODONE 5 MG immediate release tablet Commonly known as:  Oxy IR/ROXICODONE Take 1 tablet (5 mg total) by mouth every 4 (four) hours as needed for breakthrough pain.   ranitidine 300 MG tablet Commonly known as:  ZANTAC TAKE 1 TABLET TWICE DAILY. What changed:  See the new instructions.   triamterene-hydrochlorothiazide 75-50 MG tablet Commonly known as:  MAXZIDE TAKE 1 TABLET DAILY.   VENTOLIN HFA 108 (90 Base) MCG/ACT inhaler Generic drug:  albuterol USE 2 PUFFS EVERY 6 HOURS AS NEEDED FOR WHEEZING.            Discharge Care Instructions        Start     Ordered   08/14/17 0000  methocarbamol (ROBAXIN) 500 MG tablet  Every 6 hours PRN     08/14/17 0702   08/14/17 0000  oxyCODONE (OXY IR/ROXICODONE) 5 MG immediate release tablet  Every 4 hours PRN     08/14/17 0702   08/14/17 0000  Increase activity slowly     08/14/17 5681   08/14/17 0000  May shower / Bathe    Comments:  In 24 hours. Okay to wash wound with warm soapy water. Avoid scrubbing the wound. Pat dry.   08/14/17 0702   08/14/17 0000  Driving Restrictions    Comments:  Do not drive until given clearance.    08/14/17 0702   08/14/17 0000  Lifting restrictions    Comments:  Do not lift anything >10lbs. Avoid bending and twisting in awkward positions. Avoid bending at the back.   08/14/17 0702   08/14/17 0000  Diet general     08/14/17 0702   08/14/17 0000  Remove dressing in 24 hours     08/14/17 0702   08/14/17 0000  Call MD for:  temperature >100.4     08/14/17 0702   08/14/17 0000  Call MD for:  severe uncontrolled pain     08/14/17 0702   08/14/17 0000  Call MD for:  redness, tenderness, or signs of infection (pain, swelling, redness, odor or green/yellow discharge around incision site)     08/14/17 0702   08/14/17 0000  Call MD for:  difficulty breathing, headache or visual disturbances     08/14/17 0702   08/14/17 0000  Call MD for:  persistant dizziness or light-headedness     08/14/17 0702     Follow-up Information    Lisbeth Renshaw, MD. Schedule an appointment as soon as possible for a visit in 3 week(s).   Specialty:  Neurosurgery Contact information: [address] [place] [address] [phone]           Signed: Alyson Ingles 08/14/2017, 7:03 AM

## 2017-08-14 NOTE — Evaluation (Signed)
Occupational Therapy Evaluation Patient Details Name: Taylor Gay MRN: [id number] DOB: 1946-06-01 Today's Date: 08/14/2017    History of Present Illness Pt is a 71 y.o. female s/p L3-4 PLIF. PMH includes but not limited to anxiety, depression, HTN, neuropathy, SOB, pre-diabetes, s/p ORIF of L ankle, and R TKA.    Clinical Impression   Pt s/p above. Education provided in session. Pt will have assistance intermittently at home. OT signing off.     Follow Up Recommendations  No OT follow up;Supervision - Intermittent    Equipment Recommendations  Other (comment) (AE if wanted)    Recommendations for Other Services       Precautions / Restrictions Precautions Precautions: Back;Fall Precaution Booklet Issued:  (one in room) Precaution Comments: reviewed back precautions Required Braces or Orthoses: Spinal Brace Spinal Brace: Lumbar corset;Applied in sitting position Restrictions Weight Bearing Restrictions: No      Mobility Bed Mobility Overal bed mobility: Needs Assistance Bed Mobility: Rolling;Sidelying to Sit;Sit to Sidelying Rolling: Supervision (OT at edge of bed for safety) Sidelying to sit: Supervision     Sit to sidelying: Supervision General bed mobility comments: cues given  Transfers Overall transfer level: Needs assistance Equipment used: Rolling walker (2 wheeled) Transfers: Sit to/from Stand Sit to Stand: Supervision              Balance      No LOB with ambulation using RW.                                     ADL either performed or assessed with clinical judgement   ADL Overall ADL's : Needs assistance/impaired                 Upper Body Dressing : Set up;Supervision/safety;Sitting   Lower Body Dressing: Minimal assistance;Sit to/from stand;With adaptive equipment   Toilet Transfer: Supervision/safety;RW;Ambulation (sit to stand from bed; and set up for RW)           Functional mobility during ADLs:  Supervision/safety;Rolling walker General ADL Comments: Educated on back brace. Educated on LB dressing technique. Educated on safety. Discussed techniques to try to maintain back precautions.  Educated on AE and pt practiced.     Vision         Perception     Praxis      Pertinent Vitals/Pain Pain Assessment: No/denies pain Pain Score:  (11) Pain Location: back  Pain Descriptors / Indicators: Aching;Throbbing;Stabbing;Sore (stinging) Pain Intervention(s): Monitored during session     Hand Dominance  (uses both)   Extremity/Trunk Assessment Upper Extremity Assessment Upper Extremity Assessment: Overall WFL for tasks assessed   Lower Extremity Assessment Lower Extremity Assessment: Defer to PT evaluation       Communication Communication Communication: No difficulties   Cognition Arousal/Alertness: Awake/alert Behavior During Therapy: WFL for tasks assessed/performed Overall Cognitive Status: No family/caregiver present to determine baseline cognitive functioning (did not remember PT coming yesterday)                                     General Comments       Exercises     Shoulder Instructions      Home Living Family/patient expects to be discharged to:: Private residence Living Arrangements: Children Available Help at Discharge: Family;Friend(s);Available PRN/intermittently Type of Home: Apartment Home Access: Stairs to enter (has  the option to walk around the building to a level entry) Entrance Stairs-Number of Steps: 8 Entrance Stairs-Rails: Right Home Layout: One level     Bathroom Shower/Tub: Chief Strategy Officer: Handicapped height (counter near)     Home Equipment: Environmental consultant - 2 wheels;Bedside commode;Adaptive equipment (has a hurry cane ) Adaptive Equipment: Reacher        Prior Functioning/Environment Level of Independence: Independent with assistive device(s)        Comments: Used RW at baseline.          OT Problem List: Decreased strength;Pain;Decreased range of motion;Decreased activity tolerance;Decreased cognition      OT Treatment/Interventions:      OT Goals(Current goals can be found in the care plan section)    OT Frequency:     Barriers to D/C:            Co-evaluation              AM-PAC PT "6 Clicks" Daily Activity     Outcome Measure Help from another person eating meals?: None Help from another person taking care of personal grooming?: A Little Help from another person toileting, which includes using toliet, bedpan, or urinal?: A Little Help from another person bathing (including washing, rinsing, drying)?: A Little Help from another person to put on and taking off regular upper body clothing?: A Little Help from another person to put on and taking off regular lower body clothing?: A Little 6 Click Score: 19   End of Session Equipment Utilized During Treatment: Rolling walker;Back brace;Other (comment) (AE) Nurse Communication: Mobility status  Activity Tolerance: Patient tolerated treatment well Patient left: in bed;with call bell/phone within reach  OT Visit Diagnosis: Pain Pain - Right/Left:  (back) Pain - part of body:  (back)                Time: 9604-5409 OT Time Calculation (min): 32 min Charges:  OT General Charges $OT Visit: 1 Procedure OT Evaluation $OT Eval Moderate Complexity: 1 Procedure OT Treatments $Self Care/Home Management : 8-22 mins G-Codes:     Earlie Raveling OTR/L 08/14/2017, 8:53 AM

## 2017-08-14 NOTE — Care Management Note (Signed)
Case Management Note  Patient Details  Name: Taylor Gay MRN: [id number] Date of Birth: 16-Jul-1946  Subjective/Objective:                 Patient with order to DC to home today. Chart reviewed. No Home Health or Equipment needs, no unacknowledged Case Management consults or medication needs identified at the time of this note. Plan for DC to home. If needs arise today prior to discharge, please call Lawerance Sabal RN CM at [phone].    Action/Plan:   Expected Discharge Date:  08/14/17               Expected Discharge Plan:  Home/Self Care  In-House Referral:     Discharge planning Services  CM Consult  Post Acute Care Choice:    Choice offered to:     DME Arranged:    DME Agency:     HH Arranged:    HH Agency:     Status of Service:  Completed, signed off  If discussed at [employer] of Stay Meetings, dates discussed:    Additional Comments:  Lawerance Sabal, RN 08/14/2017, 7:50 AM

## 2017-08-14 NOTE — Progress Notes (Signed)
Patient is discharged from room 3C06 at this time. Alert and in stable condition IV site d/c'd and instructions read to patient and son with understanding verbalized. Left unit via wheelchair with all belongings at side

## 2017-08-16 MED FILL — Thrombin For Soln 5000 Unit: CUTANEOUS | Qty: 5000 | Status: AC

## 2017-08-16 MED FILL — Gelatin Absorbable MT Powder: OROMUCOSAL | Qty: 1 | Status: AC

## 2017-09-02 DIAGNOSIS — Z6831 Body mass index (BMI) 31.0-31.9, adult: Secondary | ICD-10-CM | POA: Diagnosis not present

## 2017-09-02 DIAGNOSIS — M4316 Spondylolisthesis, lumbar region: Secondary | ICD-10-CM | POA: Diagnosis not present

## 2017-09-02 DIAGNOSIS — I10 Essential (primary) hypertension: Secondary | ICD-10-CM | POA: Diagnosis not present

## 2017-09-03 ENCOUNTER — Ambulatory Visit: Payer: Medicare Other | Admitting: Neurology

## 2017-09-04 ENCOUNTER — Emergency Department (HOSPITAL_COMMUNITY): Payer: Medicare Other

## 2017-09-04 ENCOUNTER — Encounter: Payer: Self-pay | Admitting: Family Medicine

## 2017-09-04 ENCOUNTER — Encounter (HOSPITAL_COMMUNITY): Payer: Self-pay | Admitting: *Deleted

## 2017-09-04 ENCOUNTER — Emergency Department (HOSPITAL_COMMUNITY)
Admission: EM | Admit: 2017-09-04 | Discharge: 2017-09-04 | Disposition: A | Discharge status: Home / Self Care | Payer: Medicare Other | Attending: Emergency Medicine | Admitting: Emergency Medicine

## 2017-09-04 DIAGNOSIS — J40 Bronchitis, not specified as acute or chronic: Secondary | ICD-10-CM

## 2017-09-04 DIAGNOSIS — R7303 Prediabetes: Secondary | ICD-10-CM | POA: Insufficient documentation

## 2017-09-04 DIAGNOSIS — R079 Chest pain, unspecified: Secondary | ICD-10-CM | POA: Diagnosis not present

## 2017-09-04 DIAGNOSIS — Z79899 Other long term (current) drug therapy: Secondary | ICD-10-CM | POA: Diagnosis not present

## 2017-09-04 DIAGNOSIS — J209 Acute bronchitis, unspecified: Secondary | ICD-10-CM | POA: Insufficient documentation

## 2017-09-04 DIAGNOSIS — I10 Essential (primary) hypertension: Secondary | ICD-10-CM | POA: Diagnosis not present

## 2017-09-04 DIAGNOSIS — R0602 Shortness of breath: Secondary | ICD-10-CM

## 2017-09-04 LAB — CBC
HCT: 34.9 % — ABNORMAL LOW (ref 36.0–46.0)
Hemoglobin: 11.8 g/dL — ABNORMAL LOW (ref 12.0–15.0)
MCH: 29.1 pg (ref 26.0–34.0)
MCHC: 33.8 g/dL (ref 30.0–36.0)
MCV: 86.2 fL (ref 78.0–100.0)
PLATELETS: 333 10*3/uL (ref 150–400)
RBC: 4.05 MIL/uL (ref 3.87–5.11)
RDW: 13.6 % (ref 11.5–15.5)
WBC: 18.2 10*3/uL — ABNORMAL HIGH (ref 4.0–10.5)

## 2017-09-04 LAB — BASIC METABOLIC PANEL
ANION GAP: 11 (ref 5–15)
BUN: 20 mg/dL (ref 6–20)
CALCIUM: 8.7 mg/dL — AB (ref 8.9–10.3)
CO2: 24 mmol/L (ref 22–32)
CREATININE: 1.12 mg/dL — AB (ref 0.44–1.00)
Chloride: 99 mmol/L — ABNORMAL LOW (ref 101–111)
GFR calc non Af Amer: 49 mL/min — ABNORMAL LOW (ref >60)
GFR, EST AFRICAN AMERICAN: 57 mL/min — AB (ref >60)
Glucose, Bld: 144 mg/dL — ABNORMAL HIGH (ref 65–99)
Potassium: 3 mmol/L — ABNORMAL LOW (ref 3.5–5.1)
Sodium: 134 mmol/L — ABNORMAL LOW (ref 135–145)

## 2017-09-04 LAB — I-STAT TROPONIN, ED
TROPONIN I, POC: 0 ng/mL (ref 0.00–0.08)
Troponin i, poc: 0.01 ng/mL (ref 0.00–0.08)

## 2017-09-04 LAB — MAGNESIUM: MAGNESIUM: 1.6 mg/dL — AB (ref 1.7–2.4)

## 2017-09-04 LAB — BRAIN NATRIURETIC PEPTIDE: B NATRIURETIC PEPTIDE 5: 52.9 pg/mL (ref 0.0–100.0)

## 2017-09-04 MED ORDER — MAGNESIUM SULFATE 2 GM/50ML IV SOLN
2.0000 g | Freq: Once | INTRAVENOUS | Status: AC
  Administered 2017-09-04: 2 g via INTRAVENOUS
  Filled 2017-09-04: qty 50

## 2017-09-04 MED ORDER — DOXYCYCLINE HYCLATE 100 MG PO TABS
100.0000 mg | ORAL_TABLET | Freq: Once | ORAL | Status: AC
  Administered 2017-09-04: 100 mg via ORAL
  Filled 2017-09-04: qty 1

## 2017-09-04 MED ORDER — SODIUM CHLORIDE 0.9 % IV BOLUS (SEPSIS)
500.0000 mL | Freq: Once | INTRAVENOUS | Status: AC
  Administered 2017-09-04: 500 mL via INTRAVENOUS

## 2017-09-04 MED ORDER — POTASSIUM CHLORIDE CRYS ER 20 MEQ PO TBCR
40.0000 meq | EXTENDED_RELEASE_TABLET | Freq: Once | ORAL | Status: AC
  Administered 2017-09-04: 40 meq via ORAL
  Filled 2017-09-04: qty 2

## 2017-09-04 MED ORDER — IOPAMIDOL (ISOVUE-370) INJECTION 76%
INTRAVENOUS | Status: AC
  Administered 2017-09-04: 80 mL
  Filled 2017-09-04: qty 100

## 2017-09-04 MED ORDER — DOXYCYCLINE HYCLATE 100 MG PO CAPS: 100.0000 mg | ORAL_CAPSULE | Freq: Two times a day (BID) | ORAL | 0 refills | Status: AC

## 2017-09-04 NOTE — ED Notes (Signed)
Patient transported to CT 

## 2017-09-04 NOTE — ED Triage Notes (Signed)
States she woke this am 'burning up' and when she woke she noted her temp to be 99. States she feels sob and with side pain when taking breath. Pt had lumbar surgery 8/29. No problems with lower back. Speaking in full complete sentences and appears in nad. No cp.
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
## 2017-09-04 NOTE — ED Notes (Signed)
Pt returned from ct

## 2017-09-04 NOTE — ED Notes (Signed)
Patient transported to X-ray 

## 2017-09-04 NOTE — ED Notes (Signed)
Pt returned from xray

## 2017-09-04 NOTE — ED Notes (Signed)
Pt provided with crackers and a coke per dr little okay

## 2017-09-09 ENCOUNTER — Encounter: Payer: Self-pay | Admitting: Neurology

## 2017-09-09 ENCOUNTER — Encounter: Payer: Self-pay | Admitting: Family Medicine

## 2017-09-15 ENCOUNTER — Encounter: Payer: Self-pay | Admitting: Family Medicine

## 2017-09-15 ENCOUNTER — Ambulatory Visit (INDEPENDENT_AMBULATORY_CARE_PROVIDER_SITE_OTHER): Payer: Medicare Other | Admitting: Family Medicine

## 2017-09-15 VITALS — BP 130/80 | HR 83 | Temp 98.1°F | Wt 213.5 lb

## 2017-09-15 DIAGNOSIS — Z23 Encounter for immunization: Secondary | ICD-10-CM

## 2017-09-15 DIAGNOSIS — E876 Hypokalemia: Secondary | ICD-10-CM

## 2017-09-15 DIAGNOSIS — D72829 Elevated white blood cell count, unspecified: Secondary | ICD-10-CM

## 2017-09-15 DIAGNOSIS — L603 Nail dystrophy: Secondary | ICD-10-CM | POA: Diagnosis not present

## 2017-09-15 DIAGNOSIS — J209 Acute bronchitis, unspecified: Secondary | ICD-10-CM | POA: Diagnosis not present

## 2017-09-15 LAB — CBC WITH DIFFERENTIAL/PLATELET
BASOS ABS: 0.1 10*3/uL (ref 0.0–0.1)
Basophils Relative: 1.2 % (ref 0.0–3.0)
Eosinophils Absolute: 0.2 10*3/uL (ref 0.0–0.7)
Eosinophils Relative: 1.8 % (ref 0.0–5.0)
HCT: 36.7 % (ref 36.0–46.0)
Hemoglobin: 12.4 g/dL (ref 12.0–15.0)
LYMPHS ABS: 1.6 10*3/uL (ref 0.7–4.0)
Lymphocytes Relative: 19.6 % (ref 12.0–46.0)
MCHC: 33.7 g/dL (ref 30.0–36.0)
MCV: 87.3 fl (ref 78.0–100.0)
MONOS PCT: 5 % (ref 3.0–12.0)
Monocytes Absolute: 0.4 10*3/uL (ref 0.1–1.0)
NEUTROS ABS: 6.1 10*3/uL (ref 1.4–7.7)
NEUTROS PCT: 72.4 % (ref 43.0–77.0)
PLATELETS: 325 10*3/uL (ref 150.0–400.0)
RBC: 4.2 Mil/uL (ref 3.87–5.11)
RDW: 14 % (ref 11.5–15.5)
WBC: 8.4 10*3/uL (ref 4.0–10.5)

## 2017-09-15 LAB — BASIC METABOLIC PANEL
BUN: 22 mg/dL (ref 6–23)
CALCIUM: 9.5 mg/dL (ref 8.4–10.5)
CO2: 27 meq/L (ref 19–32)
Chloride: 103 mEq/L (ref 96–112)
Creatinine, Ser: 1.06 mg/dL (ref 0.40–1.20)
GFR: 54.54 mL/min — ABNORMAL LOW (ref >60.00)
GLUCOSE: 114 mg/dL — AB (ref 70–99)
Potassium: 4.4 mEq/L (ref 3.5–5.1)
SODIUM: 140 meq/L (ref 135–145)

## 2017-09-15 LAB — MAGNESIUM: MAGNESIUM: 2 mg/dL (ref 1.5–2.5)
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
## 2017-09-22 ENCOUNTER — Encounter: Payer: Self-pay | Admitting: Podiatry

## 2017-09-22 ENCOUNTER — Ambulatory Visit (INDEPENDENT_AMBULATORY_CARE_PROVIDER_SITE_OTHER): Payer: Medicare Other | Admitting: Podiatry

## 2017-09-22 VITALS — BP 140/94 | HR 78

## 2017-09-22 DIAGNOSIS — L600 Ingrowing nail: Secondary | ICD-10-CM

## 2017-09-22 NOTE — Patient Instructions (Signed)
  Long Term Care Instructions-Post Nail Surgery  You have had your ingrown toenail and root treated with a chemical.  This chemical causes a burn that will drain and ooze like a blister.  This can drain for 6-8 weeks or longer.  It is important to keep this area clean, covered, and follow the soaking instructions dispensed at the time of your surgery.  This area will eventually dry and form a scab.  Once the scab forms you no longer need to soak or apply a dressing.  If at any time you experience an increase in pain, redness, swelling, or drainage, you should contact the office as soon as possible.   Epsom Salt Soak Instructions    IF SOAKING IS STILL NECESSARY, START THIS 2 WEEKS AFTER INITIAL PROCEDURE   Place 1/4 cup of epsom salts in a quart of warm tap water.  Soak your foot or feet in the solution for 20 minutes twice a day until you notice the area has dried and a scab has formed. Continue to apply other medications to the area as directed by the doctor such as polysporin, neosporin or cortisporin drops.  IF YOUR SKIN BECOMES IRRITATED WHILE USING THESE INSTRUCTIONS, IT IS OKAY TO SWITCH TO  WHITE VINEGAR AND WATER. Or you may use antibacterial soap and water to keep the toe clean  Monitor for any signs/symptoms of infection. Call the office immediately if any occur or go directly to the emergency room. Call with any questions/concerns. 
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
## 2017-10-05 ENCOUNTER — Encounter: Payer: Self-pay | Admitting: Family Medicine

## 2017-10-05 ENCOUNTER — Ambulatory Visit (INDEPENDENT_AMBULATORY_CARE_PROVIDER_SITE_OTHER): Payer: Medicare Other | Admitting: Family Medicine

## 2017-10-05 VITALS — BP 130/80 | HR 74 | Temp 97.8°F | Wt 218.3 lb

## 2017-10-05 DIAGNOSIS — J029 Acute pharyngitis, unspecified: Secondary | ICD-10-CM

## 2017-10-05 DIAGNOSIS — K219 Gastro-esophageal reflux disease without esophagitis: Secondary | ICD-10-CM

## 2017-10-05 DIAGNOSIS — R0781 Pleurodynia: Secondary | ICD-10-CM | POA: Diagnosis not present

## 2017-10-05 LAB — POCT RAPID STREP A (OFFICE): Rapid Strep A Screen: NEGATIVE

## 2017-10-05 MED ORDER — DOXYCYCLINE HYCLATE 100 MG PO CAPS: 100.0000 mg | ORAL_CAPSULE | Freq: Two times a day (BID) | ORAL | 0 refills | Status: DC
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
## 2017-10-05 NOTE — Patient Instructions (Signed)
Get back on Ranitidine twice daily Elevate head of bed 4-6 inches. Finish out the Doxycycline Let me know if cough not better in 1-2 weeks.

## 2017-10-06 ENCOUNTER — Ambulatory Visit (INDEPENDENT_AMBULATORY_CARE_PROVIDER_SITE_OTHER): Payer: Medicare Other | Admitting: Neurology

## 2017-10-06 ENCOUNTER — Ambulatory Visit: Payer: Medicare Other | Admitting: Podiatry

## 2017-10-06 ENCOUNTER — Encounter: Payer: Self-pay | Admitting: Neurology

## 2017-10-06 VITALS — BP 140/80 | HR 61 | Ht 69.0 in | Wt 216.2 lb

## 2017-10-06 DIAGNOSIS — R413 Other amnesia: Secondary | ICD-10-CM | POA: Diagnosis not present

## 2017-10-06 DIAGNOSIS — E538 Deficiency of other specified B group vitamins: Secondary | ICD-10-CM | POA: Diagnosis not present

## 2017-10-06 DIAGNOSIS — G629 Polyneuropathy, unspecified: Secondary | ICD-10-CM

## 2017-10-06 NOTE — Progress Notes (Signed)
Follow-up Visit   Date: 10/06/17    Taylor NeighboursLinda B Gay MRN: [id number] DOB: 05/15/1946   Interim History: Taylor Gay is a 71 y.o. right-handed Caucasian female with migraine, anxiety, hypertension, lumbar spinal stenosis, depression, IBS returning to the clinic for follow-up of neuropathy and new complaints of memory changes.  The patient was accompanied to the clinic by self.  History of present illness: She reports having three spells of leg weakness (1 and 2) she was walking through her home and legs gave out and (3) she was walking through a parking lot at her apartment and bilateral legs gave out.  She has no preceding warning signs such as lightheadedness, tingling, or sensation of heaviness. With her first two spells, she was easily able to get back up.  However, with her most recent episode, she had to wait about 5 minutes before she could stand, but some of this is due to the fact that she fell on her knees and legs under her which caused a great deal of pain.  Once she stood up, she was a little unsteady.  There was no confusion, bowel/urinary incontinence.  She has been using a cane since her fall.  She fractured right foot and suffered an abrasion to her right knee.   She also complains of numbness of the legs for the past few years.  She has difficulty with driving and cannot always feel the pedals.  She denies any personal history of alcohol use or diabetes.  Mother had diabetic neuropathy in her later years.   She also complains of a migraine which started two days ago.  She has migraines about once every 4 months.  [facility] and was taking Darvocet, which helped.   UPDATE 10/06/2017:  She is here for 5 month follow-up visit.  She had L3 laminectomy in August and has not had any further spells of leg weakness.  Today, she is mostly bothered by her nausea and is being treated for bronchitis with antibiotics.  She continues to have numbness of  the feet and has slipped her feet of the foot pedals in her car.  I have asked her to stop driving at her last visit and she is waiting for her son to get his licence back.    She has problems with short-term memory, such as remembering tasks and especially what she is trying to say.  Sometimes, she forgets words.  At other times, she may be reading something and unable to vocalize the words.  She drives, manages own finances and medications. She has some difficulty with household chores because of low back pain.  She has history of depression and takes Cymbalta 60mg  for this.  She takes percocet 7.5mg  at bedtime for low back pain.    Medications:  Current Outpatient Prescriptions on File Prior to Visit  Medication Sig Dispense Refill  . Aspirin-Acetaminophen-Caffeine (GOODY HEADACHE PO) Take 1 Package by mouth 5 (five) times daily as needed (HEADACHE).     Marland Kitchen. doxycycline (VIBRAMYCIN) 100 MG capsule Take 1 capsule (100 mg total) by mouth 2 (two) times daily. 20 capsule 0  . DULoxetine (CYMBALTA) 60 MG capsule Take 1 capsule (60 mg total) by mouth daily. 90 capsule 3  . methocarbamol (ROBAXIN) 500 MG tablet Take 1 tablet (500 mg total) by mouth every 6 (six) hours as needed for muscle spasms. 60 tablet 2  . mupirocin ointment (BACTROBAN) 2 % Place 1 application into the  nose 2 (two) times daily. (Patient taking differently: Place 1 application into the nose 2 (two) times daily as needed (CUTS). ) 22 g 1  . oxyCODONE-acetaminophen (PERCOCET) 7.5-325 MG tablet Take 1 tablet by mouth at bedtime.  0  . ranitidine (ZANTAC) 300 MG tablet TAKE 1 TABLET TWICE DAILY. (Patient taking differently: TAKE 300 MG TWICE DAILY AS NEEDED FOR ACID REFLUX) 180 tablet 1  . triamterene-hydrochlorothiazide (MAXZIDE) 75-50 MG tablet TAKE 1 TABLET DAILY. (Patient taking differently: Take 1 tablet by mouth daily) 90 tablet 1  . VENTOLIN HFA 108 (90 Base) MCG/ACT inhaler USE 2 PUFFS EVERY 6 HOURS AS NEEDED FOR WHEEZING. 18 g 2     No current facility-administered medications on file prior to visit.     Allergies:  Allergies  Allergen Reactions  . Amitriptyline Hcl Shortness Of Breath and Other (See Comments)    REACTION: SOB, could not move, felt like her heart stopped, felt like she was dying   . Lyrica [Pregabalin] Anaphylaxis and Swelling  . Neomycin-Bacitracin Zn-Polymyx Anaphylaxis, Shortness Of Breath, Swelling and Other (See Comments)    Went to the dermatologist and had something removed and the dermatologist put neosporin on the place he removed and pt's neck and throat started swelling and she was short of breath. Told to never use again, told she was allergic to the polysporin in neosporin, pt uses mupirocin ointment   . Polysporin [Bacitracin-Polymyxin B] Anaphylaxis, Shortness Of Breath, Swelling and Other (See Comments)    Went to the dermatologist and had something removed and the dermatologist put neosporin on the place he removed and pt's neck and throat started swelling and she was short of breath. Told to never use again, told she was allergic to the polysporin in neosporin, pt uses mupirocin ointment   . Flexeril [Cyclobenzaprine Hcl] Other (See Comments)    Hangover feelings  . Morphine Other (See Comments)    Chest pain  ...can take hydrocodone   . Rabeprazole Sodium Swelling and Other (See Comments)    SWELLING REACTION UNSPECIFIED   . Codeine Other (See Comments)    Makes her feel really bad, like a really bad hangover   . Flexeril [Cyclobenzaprine] Nausea Only and Other (See Comments)    "Hangover" feeling  . Omeprazole Nausea And Vomiting and Other (See Comments)    UPSET STOMACH  . Pantoprazole Sodium Diarrhea  . Tramadol Nausea Only and Other (See Comments)     "Hangover"  "Doesn't help the pain "    Review of Systems:  CONSTITUTIONAL: No fevers, chills, night sweats, or weight loss.  EYES: No visual changes or eye pain ENT: No hearing changes.  No history of nose bleeds.    RESPIRATORY: No cough, wheezing and shortness of breath.   CARDIOVASCULAR: Negative for chest pain, and palpitations.   GI: Negative for abdominal discomfort, blood in stools or black stools.  No recent change in bowel habits.   GU:  No history of incontinence.   MUSCLOSKELETAL: No history of joint pain or swelling.  No myalgias.   SKIN: Negative for lesions, rash, and itching.   ENDOCRINE: Negative for cold or heat intolerance, polydipsia or goiter.   PSYCH:  + depression +anxiety symptoms.   NEURO: As Above.   Vital Signs:  BP 140/80   Pulse 61   Ht 5\' 9"  (1.753 m)   Wt 216 lb 4 oz (98.1 kg)   SpO2 97%   BMI 31.93 kg/m  Pain Scale: 0 on a scale of  0-10   General: well appearing, anxious  Neurological Exam: MENTAL STATUS including orientation to time, place, person, recent and remote memory, attention span and concentration, language, and fund of knowledge is fairly intact.  She is difficult to redirect and has some pressured speech. Montreal Cognitive Assessment  10/06/2017  Visuospatial/ Executive (0/5) 3  Naming (0/3) 3  Attention: Read list of digits (0/2) 2  Attention: Read list of letters (0/1) 1  Attention: Serial 7 subtraction starting at 100 (0/3) 3  Language: Repeat phrase (0/2) 2  Language : Fluency (0/1) 1  Abstraction (0/2) 2  Delayed Recall (0/5) 4  Orientation (0/6) 6  Total 27  Adjusted Score (based on education) 28   CRANIAL NERVES: Pupils equal round and reactive to light.  Normal conjugate, extra-ocular eye movements in all directions of gaze.  No ptosis.  Face is symmetric.   MOTOR:  Motor strength is 5/5 in all extremities. No pronator drift.  Tone is normal.    MSRs:  Reflexes are 2+/4 throughout, except reduced ankle jerks bilaterally.  SENSORY:  Vibration is diminished at the ankles bilaterally.  COORDINATION/GAIT:  Normal finger-to- nose-finger.  Gait slightly wide-based and stable.   Data: NCS/EMG of the legs 05/11/2017: The  electrophysiologic findings are most consistent with a symmetric and chronic sensorimotor polyneuropathy, axon loss in type, affecting the lower extremities. Overall, these findings are moderate in degree electrically.  MRI/A brain 05/12/2017: 1. No acute intracranial abnormality. 2. Nonspecific foci of T2 FLAIR hyperintense signal abnormality predominantly in subcortical white matter most consistent with mild chronic microvascular ischemic changes. No specific findings for demyelination. 3. Mild brain parenchymal volume loss. 4. Patent circle of Willis. No high-grade stenosis, large vessel occlusion, or aneurysm identified.  MRI lumbar spine wo contrast 03/17/2017:  Transitional lumbosacral anatomy. Numbering scheme is the same as that used on the prior study.  No change in spondylosis appearing worst at L3-4 where there is moderate central canal, right worse than left lateral recess and left worse than right foraminal stenosis.  MRI thoracic spine wo contrast 03/17/2017:  Very small left paracentral protrusions T5-6 and T6-7 without central canal or foraminal narrowing. The examination is otherwise negative.  Lab Results  Component Value Date   TSH 1.57 12/18/2016   Lab Results  Component Value Date   VITAMINB12 236 04/28/2017     IMPRESSION/PLAN: 1.  Idiopathic peripheral neuropathy manifesting with numbness  - She denies painful paresthesias, therefore no role for medications  - Do not recommend that she drive or having hand controls  - Fall precautions discussed, she is compliant with using a cane and has not suffered any interval falls  2.  Low vitamin B12   - Start vitamin B12 daily  3.  Cognitive changes, likely mood related and possibly due to low vitamin B12 levels.     - Formal neurocognitive testing.  4.  s/p L3 laminectomy by Dr. Conchita Paris (07/2017).  She has not had any further spells of leg buckling since this time.   The duration of this appointment visit  was 30 minutes of face-to-face time with the patient.  Greater than 50% of this time was spent in counseling, explanation of diagnosis, planning of further management, and coordination of care.   Thank you for allowing me to participate in patient's care.  If I can answer any additional questions, I would be pleased to do so.    Sincerely,    Xandria Gallaga K. Allena Katz, DO

## 2017-10-06 NOTE — Patient Instructions (Addendum)
1.  Start vitamin B12  2.  Formal neurocognitive testing 3.  Do not drive.  Please consider having hand controls installed in your car  Return to clinic in 6 months

## 2017-11-04 ENCOUNTER — Encounter: Payer: Self-pay | Admitting: Family Medicine

## 2017-11-22 ENCOUNTER — Encounter: Payer: Self-pay | Admitting: Gastroenterology

## 2017-11-22 ENCOUNTER — Ambulatory Visit: Payer: Medicare Other | Admitting: Gastroenterology

## 2017-11-22 ENCOUNTER — Other Ambulatory Visit (INDEPENDENT_AMBULATORY_CARE_PROVIDER_SITE_OTHER): Payer: Medicare Other

## 2017-11-22 VITALS — BP 120/84 | HR 96 | Ht 67.25 in | Wt 215.0 lb

## 2017-11-22 DIAGNOSIS — R079 Chest pain, unspecified: Secondary | ICD-10-CM

## 2017-11-22 DIAGNOSIS — R1011 Right upper quadrant pain: Secondary | ICD-10-CM

## 2017-11-22 DIAGNOSIS — K219 Gastro-esophageal reflux disease without esophagitis: Secondary | ICD-10-CM | POA: Diagnosis not present

## 2017-11-22 LAB — HEPATIC FUNCTION PANEL
ALBUMIN: 4.4 g/dL (ref 3.5–5.2)
ALK PHOS: 94 U/L (ref 39–117)
ALT: 15 U/L (ref 0–35)
AST: 14 U/L (ref 0–37)
BILIRUBIN DIRECT: 0.1 mg/dL (ref 0.0–0.3)
TOTAL PROTEIN: 7.6 g/dL (ref 6.0–8.3)
Total Bilirubin: 0.2 mg/dL (ref 0.2–1.2)

## 2017-11-22 NOTE — Progress Notes (Signed)
    History of Present Illness: This is a 71 year old female with RUQ pain, right lower chest pain and mild bloating. Symptoms not related to meals or bowel movements.  She relates symptoms seem to occur randomly and they are present frequently usually every few days.  She has significant chronic low back pain and spine disease.  She takes Percocet at night to help control pain.  Colonoscopy 09/2015 abnormal mucosa descending colon and hepatic flexure. EGD 09/2015: erosive gastritis, LA class A esophagitis HIDA 2017 normal with 95% EF Abd US 2016 hepatic steatosis otherwise normal Abd/pelvic CT 2016 small hepatic cyst, mild L3-L4 degenerative change  1. Surgical [P], descending and hepatic flexure - FOCAL ACTIVE COLITIS WITH MUCOSAL EROSION. - NO DYSPLASIA OR MALIGNANCY IDENTIFIED. - SEE COMMENT. 2. Surgical [P], gastric antrum - MILD CHRONIC GASTRITIS WITH FOCAL EROSION. -A WARTHIN-STARRY STAIN IS NEGATIVE FOR HELICOBACTER PYLORI. -NEGATIVE FOR INTESTINAL METAPLASIA OR MALIGNANCY. 3. Surgical [P], GE junction -BENIGN SQUAMOGLANDULAR MUCOSA WITH CHRONIC INFLAMMATION. -NEGATIVE FOR INTESTINAL METAPLASIA, DYSPLASIA OR MALIGNANCY.  Current Medications, Allergies, Past Medical History, Past Surgical History, Family History and Social History were reviewed in [facility] electronic [facility] record.  Physical Exam: General: Well developed, well nourished, no acute distress Head: Normocephalic and atraumatic Eyes:  sclerae anicteric, EOMI Ears: Normal auditory acuity Mouth: No deformity or lesions Lungs: Clear throughout to auscultation Chest: Right lower anterior and lateral chest wall tenderness to palpation Heart: Regular rate and rhythm; no murmurs, rubs or bruits Abdomen: Soft, mild right upper quadrant and right flank tenderness just below the costal margin and non distended. No masses, hepatosplenomegaly or hernias noted. Normal Bowel sounds Rectal: not done Musculoskeletal:  Symmetrical with no gross deformities  Pulses:  Normal pulses noted Extremities: No clubbing, cyanosis, edema or deformities noted Neurological: Alert oriented x 4, grossly nonfocal Psychological:  Alert and cooperative. Normal mood and affect  Assessment and Recommendations:  1. GERD. History of DU, GU. Ranitidine 300 mg daily (not prn) long term. Follow antireflux measures.   2. Right chest pain, RUQ pain. This appears to be musculoskeletal pain and she should follow-up with her PCP for further management.  She had an extensive GI evaluation in 2016 and 2017 as summarized above.  Schedule RUQ US and obtain LFTs.   3. Hepatic steatosis. See #2.  Long-term weight loss program with a lower carb and lower fat diet supervised by her PCP.

## 2017-11-22 NOTE — Patient Instructions (Signed)
Your physician has requested that you go to the basement for the following lab work before leaving today: Hepatic function panel.  You have been scheduled for an abdominal ultrasound at [facility] (1st floor of hospital) on 11/25/17 at 10:00am. Please arrive 15 minutes prior to your appointment for registration. Make certain not to have anything to eat or drink 8 hours prior to your appointment. Should you need to reschedule your appointment, please contact radiology at [phone]. This test typically takes about 30 minutes to perform.  Normal BMI (Body Mass Index- based on height and weight) is between 23 and 30. Your BMI today is Body mass index is 33.42 kg/m. Marland Kitchen. Please consider follow up  regarding your BMI with your Primary Care Provider.   Thank you for choosing me and [facility].  Venita LickMalcolm T. Pleas KochStark, Jr., MD., Clementeen GrahamFACG

## 2017-11-25 ENCOUNTER — Ambulatory Visit (HOSPITAL_COMMUNITY)
Admission: RE | Admit: 2017-11-25 | Discharge: 2017-11-25 | Disposition: A | Discharge status: Home / Self Care | Payer: Medicare Other | Source: Ambulatory Visit | Attending: Gastroenterology | Admitting: Gastroenterology

## 2017-11-25 DIAGNOSIS — R1011 Right upper quadrant pain: Secondary | ICD-10-CM | POA: Diagnosis present

## 2017-11-25 DIAGNOSIS — K760 Fatty (change of) liver, not elsewhere classified: Secondary | ICD-10-CM | POA: Insufficient documentation

## 2017-11-26 ENCOUNTER — Telehealth: Payer: Self-pay | Admitting: Family Medicine

## 2017-11-26 NOTE — Telephone Encounter (Signed)
ok 

## 2017-11-26 NOTE — Telephone Encounter (Signed)
Spoke with patient and she will wait until her appointment for her labs.

## 2017-11-26 NOTE — Telephone Encounter (Signed)
Copied from CRM [phone]. Topic: Inquiry >> Nov 26, 2017  9:13 AM Yvonna Alanisobinson, Andra M wrote: Reason for CRM: Patient wants her lab completed before her January 2019 Physical. Patient would like a call back at [phone] today to inquire about the lab.       Thank You!!!

## 2017-11-26 NOTE — Telephone Encounter (Signed)
Okay to order labs?

## 2017-12-06 ENCOUNTER — Encounter: Payer: Self-pay | Admitting: Family Medicine

## 2017-12-13 ENCOUNTER — Ambulatory Visit: Payer: Medicare Other | Admitting: Family Medicine

## 2017-12-15 ENCOUNTER — Ambulatory Visit: Payer: Medicare Other | Admitting: Family Medicine

## 2017-12-15 ENCOUNTER — Encounter: Payer: Self-pay | Admitting: Family Medicine

## 2017-12-15 VITALS — BP 142/86 | HR 82 | Temp 98.0°F | Wt 216.0 lb

## 2017-12-15 DIAGNOSIS — R635 Abnormal weight gain: Secondary | ICD-10-CM

## 2017-12-15 DIAGNOSIS — L409 Psoriasis, unspecified: Secondary | ICD-10-CM

## 2017-12-15 DIAGNOSIS — R600 Localized edema: Secondary | ICD-10-CM

## 2017-12-15 DIAGNOSIS — I10 Essential (primary) hypertension: Secondary | ICD-10-CM

## 2017-12-15 MED ORDER — FUROSEMIDE 20 MG PO TABS
ORAL_TABLET | ORAL | 3 refills | Status: DC
Start: 2017-12-15 — End: 2019-11-03

## 2017-12-15 NOTE — Patient Instructions (Signed)
Consider calorie tracking app such as "Myfitnesspal"     Potassium Content of Foods Potassium is a mineral found in many foods and drinks. It helps keep fluids and minerals balanced in your body and affects how steadily your heart beats. Potassium also helps control your blood pressure and keep your muscles and nervous system healthy. Certain health conditions and medicines may change the balance of potassium in your body. When this happens, you can help balance your level of potassium through the foods that you do or do not eat. Your health care provider or dietitian may recommend an amount of potassium that you should have each day. The following lists of foods provide the amount of potassium (in parentheses) per serving in each item. High in potassium The following foods and beverages have 200 mg or more of potassium per serving:  Apricots, 2 raw or 5 dry (200 mg).  Artichoke, 1 medium (345 mg).  Avocado, raw,  each (245 mg).  Banana, 1 medium (425 mg).  Beans, lima, or baked beans, canned,  cup (280 mg).  Beans, white, canned,  cup (595 mg).  Beef roast, 3 oz (320 mg).  Beef, ground, 3 oz (270 mg).  Beets, raw or cooked,  cup (260 mg).  Bran muffin, 2 oz (300 mg).  Broccoli,  cup (230 mg).  Brussels sprouts,  cup (250 mg).  Cantaloupe,  cup (215 mg).  Cereal, 100% bran,  cup (200-400 mg).  Cheeseburger, single, fast food, 1 each (225-400 mg).  Chicken, 3 oz (220 mg).  Clams, canned, 3 oz (535 mg).  Crab, 3 oz (225 mg).  Dates, 5 each (270 mg).  Dried beans and peas,  cup (300-475 mg).  Figs, dried, 2 each (260 mg).  Fish: halibut, tuna, cod, snapper, 3 oz (480 mg).  Fish: salmon, haddock, swordfish, perch, 3 oz (300 mg).  Fish, tuna, canned 3 oz (200 mg).  [place]ama[place] fries, fast food, 3 oz (470 mg).  Granola with fruit and nuts,  cup (200 mg).  Grapefruit juice,  cup (200 mg).  Greens, beet,  cup (655 mg).  Honeydew melon,  cup (200  mg).  Kale, raw, 1 cup (300 mg).  Kiwi, 1 medium (240 mg).  Kohlrabi, rutabaga, parsnips,  cup (280 mg).  Lentils,  cup (365 mg).  Mango, 1 each (325 mg).  Milk, chocolate, 1 cup (420 mg).  Milk: nonfat, low-fat, whole, buttermilk, 1 cup (350-380 mg).  Molasses, 1 Tbsp (295 mg).  Mushrooms,  cup (280) mg.  Nectarine, 1 each (275 mg).  Nuts: almonds, peanuts, hazelnuts, [place], cashew, mixed, 1 oz (200 mg).  Nuts, pistachios, 1 oz (295 mg).  Orange, 1 each (240 mg).  Orange juice,  cup (235 mg).  Papaya, medium,  fruit (390 mg).  Peanut butter, chunky, 2 Tbsp (240 mg).  Peanut butter, smooth, 2 Tbsp (210 mg).  Pear, 1 medium (200 mg).  Pomegranate, 1 whole (400 mg).  Pomegranate juice,  cup (215 mg).  Pork, 3 oz (350 mg).  Potato chips, salted, 1 oz (465 mg).  Potato, baked with skin, 1 medium (925 mg).  Potatoes, boiled,  cup (255 mg).  Potatoes, mashed,  cup (330 mg).  Prune juice,  cup (370 mg).  Prunes, 5 each (305 mg).  Pudding, chocolate,  cup (230 mg).  Pumpkin, canned,  cup (250 mg).  Raisins, seedless,  cup (270 mg).  Seeds, sunflower or pumpkin, 1 oz (240 mg).  Soy milk, 1 cup (300 mg).  Spinach,  cup (  420 mg).  Spinach, canned,  cup (370 mg).  Sweet potato, baked with skin, 1 medium (450 mg).  Swiss chard,  cup (480 mg).  Tomato or vegetable juice,  cup (275 mg).  Tomato sauce or puree,  cup (400-550 mg).  Tomato, raw, 1 medium (290 mg).  Tomatoes, canned,  cup (200-300 mg).  [place], 3 oz (250 mg).  Wheat germ, 1 oz (250 mg).  Winter squash,  cup (250 mg).  Yogurt, plain or fruited, 6 oz (260-435 mg).  Zucchini,  cup (220 mg).  Moderate in potassium The following foods and beverages have 50-200 mg of potassium per serving:  Apple, 1 each (150 mg).  Apple juice,  cup (150 mg).  Applesauce,  cup (90 mg).  Apricot nectar,  cup (140 mg).  Asparagus, small spears,  cup or 6 spears (155  mg).  Bagel, cinnamon raisin, 1 each (130 mg).  Bagel, egg or plain, 4 in., 1 each (70 mg).  Beans, green,  cup (90 mg).  Beans, yellow,  cup (190 mg).  Beer, regular, 12 oz (100 mg).  Beets, canned,  cup (125 mg).  Blackberries,  cup (115 mg).  Blueberries,  cup (60 mg).  Bread, whole wheat, 1 slice (70 mg).  Broccoli, raw,  cup (145 mg).  Cabbage,  cup (150 mg).  Carrots, cooked or raw,  cup (180 mg).  Cauliflower, raw,  cup (150 mg).  Celery, raw,  cup (155 mg).  Cereal, bran flakes, cup (120-150 mg).  Cheese, cottage,  cup (110 mg).  Cherries, 10 each (150 mg).  Chocolate, 1 oz bar (165 mg).  Coffee, brewed 6 oz (90 mg).  Corn,  cup or 1 ear (195 mg).  Cucumbers,  cup (80 mg).  Egg, large, 1 each (60 mg).  Eggplant,  cup (60 mg).  Endive, raw, cup (80 mg).  English muffin, 1 each (65 mg).  Fish, orange roughy, 3 oz (150 mg).  Frankfurter, beef or pork, 1 each (75 mg).  Fruit cocktail,  cup (115 mg).  Grape juice,  cup (170 mg).  Grapefruit,  fruit (175 mg).  Grapes,  cup (155 mg).  Greens: kale, turnip, collard,  cup (110-150 mg).  Ice cream or frozen yogurt, chocolate,  cup (175 mg).  Ice cream or frozen yogurt, vanilla,  cup (120-150 mg).  Lemons, limes, 1 each (80 mg).  Lettuce, all types, 1 cup (100 mg).  Mixed vegetables,  cup (150 mg).  Mushrooms, raw,  cup (110 mg).  Nuts: walnuts, pecans, or macadamia, 1 oz (125 mg).  Oatmeal,  cup (80 mg).  Okra,  cup (110 mg).  Onions, raw,  cup (120 mg).  Peach, 1 each (185 mg).  Peaches, canned,  cup (120 mg).  Pears, canned,  cup (120 mg).  Peas, green, frozen,  cup (90 mg).  Peppers, green,  cup (130 mg).  Peppers, red,  cup (160 mg).  Pineapple juice,  cup (165 mg).  Pineapple, fresh or canned,  cup (100 mg).  Plums, 1 each (105 mg).  Pudding, vanilla,  cup (150 mg).  Raspberries,  cup (90 mg).  Rhubarb,  cup (115  mg).  Rice, wild,  cup (80 mg).  Shrimp, 3 oz (155 mg).  Spinach, raw, 1 cup (170 mg).  Strawberries,  cup (125 mg).  Summer squash  cup (175-200 mg).  Swiss chard, raw, 1 cup (135 mg).  Tangerines, 1 each (140 mg).  Tea, brewed, 6 oz (65 mg).  Turnips,  cup (140  mg).  Watermelon,  cup (85 mg).  Wine, red, table, 5 oz (180 mg).  Wine, white, table, 5 oz (100 mg).  Low in potassium The following foods and beverages have less than 50 mg of potassium per serving.  Bread, white, 1 slice (30 mg).  Carbonated beverages, 12 oz (less than 5 mg).  Cheese, 1 oz (20-30 mg).  Cranberries,  cup (45 mg).  Cranberry juice cocktail,  cup (20 mg).  Fats and oils, 1 Tbsp (less than 5 mg).  Hummus, 1 Tbsp (32 mg).  Nectar: papaya, mango, or pear,  cup (35 mg).  Rice, white or brown,  cup (50 mg).  Spaghetti or macaroni,  cup cooked (30 mg).  Tortilla, flour or corn, 1 each (50 mg).  Waffle, 4 in., 1 each (50 mg).  Water chestnuts,  cup (40 mg).  This information is not intended to replace advice given to you by your health care provider. Make sure you discuss any questions you have with your health care provider. Document Released: 07/21/2005 Document Revised: 05/14/2016 Document Reviewed: 11/03/2013 Elsevier Interactive Patient Education  [employer].
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
## 2017-12-24 ENCOUNTER — Encounter: Payer: Self-pay | Admitting: Family Medicine

## 2017-12-24 ENCOUNTER — Ambulatory Visit (INDEPENDENT_AMBULATORY_CARE_PROVIDER_SITE_OTHER): Payer: Medicare Other | Admitting: Family Medicine

## 2017-12-24 VITALS — BP 132/90 | HR 110 | Temp 97.9°F | Ht 67.0 in | Wt 216.0 lb

## 2017-12-24 DIAGNOSIS — L400 Psoriasis vulgaris: Secondary | ICD-10-CM | POA: Diagnosis not present

## 2017-12-24 DIAGNOSIS — R21 Rash and other nonspecific skin eruption: Secondary | ICD-10-CM | POA: Diagnosis not present

## 2017-12-24 DIAGNOSIS — Z0001 Encounter for general adult medical examination with abnormal findings: Secondary | ICD-10-CM

## 2017-12-24 DIAGNOSIS — Z0000 Encounter for general adult medical examination without abnormal findings: Secondary | ICD-10-CM

## 2017-12-24 LAB — LIPID PANEL
CHOLESTEROL: 235 mg/dL — AB (ref 0–200)
HDL: 59.4 mg/dL (ref >39.00)
LDL Cholesterol: 154 mg/dL — ABNORMAL HIGH (ref 0–99)
NonHDL: 175.1
Total CHOL/HDL Ratio: 4
Triglycerides: 106 mg/dL (ref 0.0–149.0)
VLDL: 21.2 mg/dL (ref 0.0–40.0)

## 2017-12-24 LAB — CBC WITH DIFFERENTIAL/PLATELET
BASOS PCT: 1.2 % (ref 0.0–3.0)
Basophils Absolute: 0.1 10*3/uL (ref 0.0–0.1)
Eosinophils Absolute: 0.1 10*3/uL (ref 0.0–0.7)
Eosinophils Relative: 1.5 % (ref 0.0–5.0)
HCT: 43.4 % (ref 36.0–46.0)
Hemoglobin: 14.3 g/dL (ref 12.0–15.0)
LYMPHS ABS: 2 10*3/uL (ref 0.7–4.0)
Lymphocytes Relative: 24.4 % (ref 12.0–46.0)
MCHC: 33 g/dL (ref 30.0–36.0)
MCV: 84 fl (ref 78.0–100.0)
MONO ABS: 0.5 10*3/uL (ref 0.1–1.0)
Monocytes Relative: 6.4 % (ref 3.0–12.0)
NEUTROS ABS: 5.4 10*3/uL (ref 1.4–7.7)
NEUTROS PCT: 66.5 % (ref 43.0–77.0)
Platelets: 328 10*3/uL (ref 150.0–400.0)
RBC: 5.16 Mil/uL — ABNORMAL HIGH (ref 3.87–5.11)
RDW: 15.1 % (ref 11.5–15.5)
WBC: 8 10*3/uL (ref 4.0–10.5)

## 2017-12-24 LAB — BASIC METABOLIC PANEL
BUN: 30 mg/dL — ABNORMAL HIGH (ref 6–23)
CO2: 22 mEq/L (ref 19–32)
Calcium: 9.9 mg/dL (ref 8.4–10.5)
Chloride: 102 mEq/L (ref 96–112)
Creatinine, Ser: 1.11 mg/dL (ref 0.40–1.20)
GFR: 51.67 mL/min — AB (ref >60.00)
Glucose, Bld: 112 mg/dL — ABNORMAL HIGH (ref 70–99)
POTASSIUM: 3.7 meq/L (ref 3.5–5.1)
SODIUM: 138 meq/L (ref 135–145)

## 2017-12-24 LAB — TSH: TSH: 2.06 u[IU]/mL (ref 0.35–4.50)

## 2017-12-24 LAB — HEPATIC FUNCTION PANEL
ALBUMIN: 4.6 g/dL (ref 3.5–5.2)
ALT: 16 U/L (ref 0–35)
AST: 18 U/L (ref 0–37)
Alkaline Phosphatase: 108 U/L (ref 39–117)
Bilirubin, Direct: 0.1 mg/dL (ref 0.0–0.3)
Total Bilirubin: 0.4 mg/dL (ref 0.2–1.2)
Total Protein: 7.3 g/dL (ref 6.0–8.3)

## 2017-12-24 MED ORDER — ONETOUCH VERIO W/DEVICE KIT: 1.0000 | PACK | 0 refills | Status: AC

## 2017-12-24 MED ORDER — NYSTATIN 100000 UNIT/GM EX CREA: 1.0000 "application " | TOPICAL_CREAM | Freq: Two times a day (BID) | CUTANEOUS | 1 refills | Status: DC | PRN
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
## 2017-12-27 ENCOUNTER — Encounter (HOSPITAL_COMMUNITY): Payer: Self-pay | Admitting: *Deleted

## 2017-12-27 ENCOUNTER — Other Ambulatory Visit: Payer: Self-pay

## 2017-12-27 ENCOUNTER — Emergency Department (HOSPITAL_COMMUNITY): Payer: Medicare Other

## 2017-12-27 ENCOUNTER — Emergency Department (HOSPITAL_COMMUNITY)
Admission: EM | Admit: 2017-12-27 | Discharge: 2017-12-27 | Disposition: A | Discharge status: Home / Self Care | Payer: Medicare Other | Attending: Emergency Medicine | Admitting: Emergency Medicine

## 2017-12-27 ENCOUNTER — Ambulatory Visit: Payer: Self-pay | Admitting: *Deleted

## 2017-12-27 DIAGNOSIS — H532 Diplopia: Secondary | ICD-10-CM | POA: Insufficient documentation

## 2017-12-27 DIAGNOSIS — I10 Essential (primary) hypertension: Secondary | ICD-10-CM | POA: Diagnosis not present

## 2017-12-27 DIAGNOSIS — R2681 Unsteadiness on feet: Secondary | ICD-10-CM | POA: Diagnosis not present

## 2017-12-27 DIAGNOSIS — R42 Dizziness and giddiness: Secondary | ICD-10-CM

## 2017-12-27 DIAGNOSIS — I639 Cerebral infarction, unspecified: Secondary | ICD-10-CM | POA: Diagnosis not present

## 2017-12-27 DIAGNOSIS — Z96651 Presence of right artificial knee joint: Secondary | ICD-10-CM | POA: Diagnosis not present

## 2017-12-27 LAB — DIFFERENTIAL
BASOS ABS: 0 10*3/uL (ref 0.0–0.1)
BASOS PCT: 0 %
EOS ABS: 0.1 10*3/uL (ref 0.0–0.7)
EOS PCT: 1 %
LYMPHS ABS: 2.1 10*3/uL (ref 0.7–4.0)
Lymphocytes Relative: 16 %
Monocytes Absolute: 0.8 10*3/uL (ref 0.1–1.0)
Monocytes Relative: 6 %
Neutro Abs: 10.5 10*3/uL — ABNORMAL HIGH (ref 1.7–7.7)
Neutrophils Relative %: 77 %

## 2017-12-27 LAB — CBG MONITORING, ED: GLUCOSE-CAPILLARY: 102 mg/dL — AB (ref 65–99)

## 2017-12-27 LAB — I-STAT CHEM 8, ED
BUN: 24 mg/dL — ABNORMAL HIGH (ref 6–20)
CALCIUM ION: 1.11 mmol/L — AB (ref 1.15–1.40)
Chloride: 102 mmol/L (ref 101–111)
Creatinine, Ser: 1 mg/dL (ref 0.44–1.00)
GLUCOSE: 138 mg/dL — AB (ref 65–99)
HCT: 42 % (ref 36.0–46.0)
HEMOGLOBIN: 14.3 g/dL (ref 12.0–15.0)
Potassium: 3.7 mmol/L (ref 3.5–5.1)
Sodium: 139 mmol/L (ref 135–145)
TCO2: 24 mmol/L (ref 22–32)

## 2017-12-27 LAB — CBC
HCT: 42.2 % (ref 36.0–46.0)
Hemoglobin: 13.9 g/dL (ref 12.0–15.0)
MCH: 27.7 pg (ref 26.0–34.0)
MCHC: 32.9 g/dL (ref 30.0–36.0)
MCV: 84.2 fL (ref 78.0–100.0)
Platelets: 302 10*3/uL (ref 150–400)
RBC: 5.01 MIL/uL (ref 3.87–5.11)
RDW: 14.4 % (ref 11.5–15.5)
WBC: 13.6 10*3/uL — ABNORMAL HIGH (ref 4.0–10.5)

## 2017-12-27 LAB — PROTIME-INR
INR: 0.95
Prothrombin Time: 12.6 seconds (ref 11.4–15.2)

## 2017-12-27 LAB — URINALYSIS, ROUTINE W REFLEX MICROSCOPIC
BILIRUBIN URINE: NEGATIVE
GLUCOSE, UA: NEGATIVE mg/dL
HGB URINE DIPSTICK: NEGATIVE
KETONES UR: NEGATIVE mg/dL
Leukocytes, UA: NEGATIVE
Nitrite: NEGATIVE
PROTEIN: NEGATIVE mg/dL
Specific Gravity, Urine: 1.019 (ref 1.005–1.030)
pH: 5 (ref 5.0–8.0)

## 2017-12-27 LAB — COMPREHENSIVE METABOLIC PANEL
ALBUMIN: 4.2 g/dL (ref 3.5–5.0)
ALT: 21 U/L (ref 14–54)
ANION GAP: 10 (ref 5–15)
AST: 21 U/L (ref 15–41)
Alkaline Phosphatase: 103 U/L (ref 38–126)
BILIRUBIN TOTAL: 0.6 mg/dL (ref 0.3–1.2)
BUN: 23 mg/dL — AB (ref 6–20)
CHLORIDE: 101 mmol/L (ref 101–111)
CO2: 25 mmol/L (ref 22–32)
Calcium: 9.7 mg/dL (ref 8.9–10.3)
Creatinine, Ser: 1.03 mg/dL — ABNORMAL HIGH (ref 0.44–1.00)
GFR calc Af Amer: >60 mL/min (ref >60)
GFR, EST NON AFRICAN AMERICAN: 54 mL/min — AB (ref >60)
Glucose, Bld: 140 mg/dL — ABNORMAL HIGH (ref 65–99)
POTASSIUM: 3.6 mmol/L (ref 3.5–5.1)
Sodium: 136 mmol/L (ref 135–145)
TOTAL PROTEIN: 7.7 g/dL (ref 6.5–8.1)

## 2017-12-27 LAB — I-STAT TROPONIN, ED: TROPONIN I, POC: 0.01 ng/mL (ref 0.00–0.08)

## 2017-12-27 LAB — APTT: APTT: 31 s (ref 24–36)

## 2017-12-27 MED ORDER — MECLIZINE HCL 12.5 MG PO TABS: 12.5000 mg | ORAL_TABLET | Freq: Two times a day (BID) | ORAL | 0 refills | Status: DC | PRN

## 2017-12-27 MED ORDER — LORAZEPAM 2 MG/ML IJ SOLN
1.0000 mg | Freq: Once | INTRAMUSCULAR | Status: AC | PRN
  Administered 2017-12-27: 1 mg via INTRAVENOUS
  Filled 2017-12-27: qty 1

## 2017-12-27 NOTE — ED Triage Notes (Signed)
Pt c/o double vision bilaterally with weakness and impaired gait ongoing since 2:15 yesterday morning, pt ambulatory with walker, pt called PCP today & told to come here for eval, pt A&O x4, no slurred speech, no facial droop, VAN negative
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
## 2017-12-27 NOTE — ED Notes (Signed)
Pt said as soon as nurse gave ativan headache immediatly stopped hurting.

## 2017-12-27 NOTE — ED Provider Notes (Signed)
  Physical Exam  BP 130/86   Pulse 88   Temp 97.7 F (36.5 C) (Oral)   Resp 16   Ht 5\' 9"  (1.753 m)   Wt 96.6 kg (213 lb)   SpO2 100%   BMI 31.45 kg/m   Physical Exam  ED Course/Procedures     Procedures  MDM  Patient care assumed at 5 pm.  Patient has history of migraines and has been having some dizziness as well as headaches.  Also has some double vision as well.  She actually sees neurology outpatient and is scheduled for extensive testing in several days. MRI ordered and pending at sign out.   10:32 PM Patient ambulated well with walker. No headaches. No slurred speech, nl neuro exam currently. MRI brain unremarkable. UA normal. Consider peripheral vertigo vs complex migraines. She has neuro follow up. Will dc home with meclizine prn.       Charlynne PanderYao, David Hsienta, MD 12/27/17 2233

## 2017-12-27 NOTE — Telephone Encounter (Signed)
Called in c/o having severe dizziness, double vision, nausea, and "my knees buckling" that started 2:15am yesterday when you got up to go to the bathroom.  You went back to sleep and woke up still dizzy and having to hold onto things to walk around.   You held one eye shut to help with the double vision.   Today you are still feeling dizzy and having to use your walker (which you don't normally use) to get around due to the dizziness.   She said she didn't have a way to the ED but after talking with her more in detail I was able to encourage her to go to the ED which she is going to do.   She is going to call one of her children to take her to [facility].   She won't be able to go until after 1:00 because she is going to sit for a disabled neighbor while the caretaker goes and gets groceries.   I encouraged her see if someone else could sit with her neighbor due to the severity of her situation.   She is going to see what she can work out. I instructed her to call 911 if her symptoms became worse or she needed to go before she could arrange a ride to [facility].   I verbalized understanding.  Reason for Disposition . SEVERE dizziness (e.g., unable to stand, requires support to walk, feels like passing out now)  Answer Assessment - Initial Assessment Questions 1. DESCRIPTION: "Describe your dizziness."     I woke up yesterday 2:15am to go to the bathroom and I was having double vision, dizzy, nausea and my knees were very weak.  I went back to sleep and when I woke up I was still dizzy.   I had to hold one eye shut to help me get around.   I went back to sleep. 2. LIGHTHEADED: "Do you feel lightheaded?" (e.g., somewhat faint, woozy, weak upon standing)     I'm am feeling better today but I'm still dizzy today.   I get migraines.   I didn't have a migraine yesterday with these symptoms.   This dizziness does not happen during a migraine. 3. VERTIGO: "Do you feel like either you or the room is  spinning or tilting?" (i.e. vertigo)     No spinning just very light headed.   If I turn my head quickly it makes me dizzy.   I had to lay down a few minutes ago because I'm dizzy.   No nausea today.   I'm having to use my walker which I don't usually do.   I have to hold onto things to walk arouind. 4. SEVERITY: "How bad is it?"  "Do you feel like you are going to faint?" "Can you stand and walk?"   - MILD - walking normally   - MODERATE - interferes with normal activities (e.g., work, school)    - SEVERE - unable to stand, requires support to walk, feels like passing out now.      Unable to stand without support 5. ONSET:  "When did the dizziness begin?"     Yesterday at 2:15am 6. AGGRAVATING FACTORS: "Does anything make it worse?" (e.g., standing, change in head position)     Yes.   Standing and moving. 7. HEART RATE: "Can you tell me your heart rate?" "How many beats in 15 seconds?"  (Note: not all patients can do this)       I  don't feel any thing in my chest.   I just felt nausea.   My stomach hurt while I was feeling nausea. 8. CAUSE: "What do you think is causing the dizziness?"     I think it might be vertigo but I've never had vertigo for it. 9. RECURRENT SYMPTOM: "Have you had dizziness before?" If so, ask: "When was the last time?" "What happened that time?"     Never happened before 10. OTHER SYMPTOMS: "Do you have any other symptoms?" (e.g., fever, chest pain, vomiting, diarrhea, bleeding)       Nausea, no chest pain.  No diarrhea.   I just have a chronic pain in my spine. 11. PREGNANCY: "Is there any chance you are pregnant?" "When was your last menstrual period?"       Not asked due to age  Protocols used: DIZZINESS [facility]- LIGHTHEADEDNESS-A-AH

## 2017-12-27 NOTE — Telephone Encounter (Signed)
Confirmed patient has arrived in the ER now. 

## 2017-12-27 NOTE — Telephone Encounter (Signed)
Monitor for ER arrival 

## 2017-12-27 NOTE — ED Notes (Signed)
Patient transported to MRI 

## 2017-12-27 NOTE — Discharge Instructions (Signed)
Take meclizine as needed for dizziness.   Follow up with your neurologist as scheduled  Return to ER if you have worse dizziness, headaches, falling.

## 2017-12-29 LAB — URINE CULTURE: SPECIAL REQUESTS: NORMAL

## 2017-12-30 ENCOUNTER — Ambulatory Visit: Payer: Medicare Other | Admitting: Psychology

## 2017-12-30 ENCOUNTER — Ambulatory Visit (INDEPENDENT_AMBULATORY_CARE_PROVIDER_SITE_OTHER): Payer: Medicare Other | Admitting: Psychology

## 2017-12-30 ENCOUNTER — Encounter: Payer: Self-pay | Admitting: Psychology

## 2017-12-30 DIAGNOSIS — R413 Other amnesia: Secondary | ICD-10-CM | POA: Diagnosis not present

## 2017-12-30 DIAGNOSIS — R4789 Other speech disturbances: Secondary | ICD-10-CM

## 2017-12-30 DIAGNOSIS — F339 Major depressive disorder, recurrent, unspecified: Secondary | ICD-10-CM

## 2017-12-30 NOTE — Progress Notes (Signed)
   Neuropsychology Note  Taylor Gay completed 60 minutes of neuropsychological testing with technician, Taylor Gay, BS, under the supervision of Dr. Elvis CoilMaryBeth Bailar, Licensed Psychologist. The patient did not appear overtly distressed by the testing session, per behavioral observation or via self-report to the technician. Rest breaks were offered.   Communication between the psychologist and technician was ongoing throughout the testing session and changes were made as deemed necessary based on patient performance on testing, technician observations and additional pertinent factors (e.g., current level of functioning, level of presumed impairment, nature of symptoms, emotional and behavioral responses during the status exam/testing, level of literacy, estimated premorbid baseline intellectual abilities, and observed level of motivation/effort).  Taylor NeighboursLinda B Gay will return within 2 weeks for a feedback session with Dr. Alinda DoomsBailar at which time her test performances, clinical impressions and treatment recommendations will be reviewed in detail. The patient understands she can contact our office should she require our assistance before this time.  35 minutes spent performing neuropsychological evaluation services/clinical decision making (psychologist). [CPT 96132] 60 minutes spent face-to-face with patient administering standardized tests, 30 minutes spent scoring (technician). [CPT [id number], [id number] units]  Full report to follow.
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
## 2018-01-03 ENCOUNTER — Telehealth: Payer: Self-pay | Admitting: Family Medicine

## 2018-01-03 DIAGNOSIS — M2550 Pain in unspecified joint: Secondary | ICD-10-CM

## 2018-01-03 DIAGNOSIS — M1710 Unilateral primary osteoarthritis, unspecified knee: Secondary | ICD-10-CM

## 2018-01-03 NOTE — Telephone Encounter (Signed)
Referral placed.

## 2018-01-03 NOTE — Telephone Encounter (Signed)
Okay to fill?  Dx please

## 2018-01-03 NOTE — Telephone Encounter (Signed)
Ok to refer.

## 2018-01-03 NOTE — Telephone Encounter (Signed)
Copied from CRM [id number]. Topic: Referral - Question >> Jan 03, 2018  9:19 AM Oneal GroutSebastian, Jennifer S wrote: Reason for CRM: Requesting referral to see Dr Dierdre ForthBeekman, Rheumatologist for joint pain

## 2018-01-03 NOTE — Telephone Encounter (Signed)
Requesting test strip refill.   Don't see where she is diabetic or been using test strips.   Pt of Dr. Lucie LeatherBurchette's  See attached.

## 2018-01-03 NOTE — Telephone Encounter (Signed)
Copied from CRM [id number]. Topic: Quick Communication - Rx Refill/Question >> Jan 03, 2018  9:21 AM Oneal GroutSebastian, Jennifer S wrote: Medication:needs refill of test strips for One Touch Verio   Has the patient contacted their pharmacy? No current pharmacy does not carry   (Agent: If no, request that the patient contact the pharmacy for the refill.)   Preferred Pharmacy (with phone number or street name): [facility] [address]   Agent: Please be advised that RX refills may take up to 3 business days. We ask that you follow-up with your pharmacy.

## 2018-01-03 NOTE — Telephone Encounter (Signed)
Yes,  Dx=prediabetes/hyperglycemia

## 2018-01-05 DIAGNOSIS — Z1231 Encounter for screening mammogram for malignant neoplasm of breast: Secondary | ICD-10-CM | POA: Diagnosis not present

## 2018-01-05 DIAGNOSIS — Z803 Family history of malignant neoplasm of breast: Secondary | ICD-10-CM | POA: Diagnosis not present

## 2018-01-05 MED ORDER — GLUCOSE BLOOD VI STRP: ORAL_STRIP | 12 refills | Status: DC

## 2018-01-05 NOTE — Addendum Note (Signed)
Addended by: Kern ReapVEREEN, Xxavier Noon B on: 01/05/2018 12:24 PM   Modules accepted: Orders

## 2018-01-10 ENCOUNTER — Other Ambulatory Visit: Payer: Self-pay | Admitting: Family Medicine

## 2018-02-10 DIAGNOSIS — M2550 Pain in unspecified joint: Secondary | ICD-10-CM | POA: Diagnosis not present

## 2018-02-10 DIAGNOSIS — Z6831 Body mass index (BMI) 31.0-31.9, adult: Secondary | ICD-10-CM | POA: Diagnosis not present

## 2018-02-10 DIAGNOSIS — M533 Sacrococcygeal disorders, not elsewhere classified: Secondary | ICD-10-CM | POA: Diagnosis not present

## 2018-02-10 DIAGNOSIS — E669 Obesity, unspecified: Secondary | ICD-10-CM | POA: Diagnosis not present

## 2018-02-15 ENCOUNTER — Encounter: Payer: Self-pay | Admitting: Family Medicine

## 2018-02-15 ENCOUNTER — Telehealth: Payer: Self-pay | Admitting: Gastroenterology

## 2018-02-15 ENCOUNTER — Ambulatory Visit (INDEPENDENT_AMBULATORY_CARE_PROVIDER_SITE_OTHER): Payer: Medicare Other | Admitting: Family Medicine

## 2018-02-15 VITALS — BP 120/80 | HR 78 | Temp 97.5°F | Wt 216.1 lb

## 2018-02-15 DIAGNOSIS — I10 Essential (primary) hypertension: Secondary | ICD-10-CM

## 2018-02-15 DIAGNOSIS — L400 Psoriasis vulgaris: Secondary | ICD-10-CM

## 2018-02-15 DIAGNOSIS — F4321 Adjustment disorder with depressed mood: Secondary | ICD-10-CM | POA: Diagnosis not present

## 2018-02-15 DIAGNOSIS — E669 Obesity, unspecified: Secondary | ICD-10-CM | POA: Diagnosis not present

## 2018-02-15 MED ORDER — RANITIDINE HCL 300 MG PO TABS: 300.0000 mg | ORAL_TABLET | Freq: Two times a day (BID) | ORAL | 1 refills | Status: AC

## 2018-02-15 NOTE — Telephone Encounter (Signed)
Prescription for Zantac sent to patient's pharmacy and patient notified. Patient verbalized understanding.
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
## 2018-02-15 NOTE — Patient Instructions (Signed)
Would follow up with Dr Dierdre ForthBeekman if no improvement with the Etodolac by end of week.    Be sure and take the Etodolac with food  Your BP is much better today.

## 2018-02-16 NOTE — Telephone Encounter (Signed)
[facility] [address] Please re-send. She said that [facility] not [facility] for strips so it was going to be to expensive

## 2018-02-18 ENCOUNTER — Telehealth: Payer: Self-pay | Admitting: Family Medicine

## 2018-02-18 ENCOUNTER — Other Ambulatory Visit: Payer: Self-pay

## 2018-02-18 MED ORDER — GLUCOSE BLOOD VI STRP: ORAL_STRIP | 12 refills | Status: DC

## 2018-02-18 NOTE — Telephone Encounter (Signed)
Switching [facility] [address]

## 2018-02-18 NOTE — Telephone Encounter (Signed)
Copied from CRM [phone]. Topic: Quick Communication - Rx Refill/Question >> Feb 18, 2018  9:21 AM Floria RavelingStovall, Shana A wrote: Medication: glucose blood (ONETOUCH VERIO) test strip [[id number]][[id number]]    Has the patient contacted their pharmacy? No   (Agent: If no, request that the patient contact the pharmacy for the refill.)   Preferred Pharmacy (with phone number or street name): [facility] [address] /  they are cheaper [facility]  [facility]    Agent: Please be advised that RX refills may take up to 3 business days. We ask that you follow-up with your pharmacy.
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
## 2018-02-21 ENCOUNTER — Telehealth: Payer: Self-pay | Admitting: Psychology

## 2018-02-21 NOTE — Telephone Encounter (Signed)
Patient called and said that she has a really bad cold and feels she should not come in tomorrow. Could the results be given over the phone or should she reschedule to another day? She also was requesting the results sent to her so she will remember. Please Advise. Thanks

## 2018-02-21 NOTE — Telephone Encounter (Signed)
I would be happy to call her so she does not have to come in. Please let her know I'll call her at the appointment time, and I can also send her written summary as well. Thanks!!

## 2018-02-21 NOTE — Telephone Encounter (Signed)
I called patient and made her aware. Thank you

## 2018-02-22 ENCOUNTER — Encounter: Payer: Self-pay | Admitting: Psychology

## 2018-02-22 ENCOUNTER — Ambulatory Visit: Payer: Medicare Other | Admitting: Psychology

## 2018-02-22 DIAGNOSIS — R413 Other amnesia: Secondary | ICD-10-CM

## 2018-02-22 DIAGNOSIS — F332 Major depressive disorder, recurrent severe without psychotic features: Secondary | ICD-10-CM

## 2018-03-11 DIAGNOSIS — D1801 Hemangioma of skin and subcutaneous tissue: Secondary | ICD-10-CM | POA: Diagnosis not present

## 2018-03-11 DIAGNOSIS — L814 Other melanin hyperpigmentation: Secondary | ICD-10-CM | POA: Diagnosis not present

## 2018-03-11 DIAGNOSIS — D225 Melanocytic nevi of trunk: Secondary | ICD-10-CM | POA: Diagnosis not present

## 2018-03-11 DIAGNOSIS — L304 Erythema intertrigo: Secondary | ICD-10-CM | POA: Diagnosis not present

## 2018-03-19 ENCOUNTER — Encounter: Payer: Self-pay | Admitting: Family Medicine

## 2018-04-04 ENCOUNTER — Telehealth: Payer: Self-pay | Admitting: Psychology

## 2018-04-04 NOTE — Telephone Encounter (Signed)
Never got your report you were to mail to her and states that the medication is not working and she wanted to know if you had talked with Dr Caryl NeverBurchette about medication. She will see be here on Wednesday to see Dr Allena KatzPatel she states that she can pick it up your report then if that is ok.but would like to know the status of the medication.

## 2018-04-04 NOTE — Telephone Encounter (Signed)
I assume she is talking about her antidepressant. Per my report, I recommended the following:  "1. I highly recommend that the patient have more aggressive treatment of her severe depression. She is taking Cymbalta with no perceived benefit. She notes that she took Lexapro back in 2005 when her brother passed away and that was very effective. I recommend that she speak with her PCP about going back on Lexapro. Of course all medication decisions are deferred to her prescribing physicians."  She should contact her PCP to discuss this.   The report was mailed to her but she is welcome to have a copy when she is back in our office to see Dr. Allena KatzPatel.

## 2018-04-04 NOTE — Telephone Encounter (Signed)
I'll address these questions at her f/u on Wednesday.

## 2018-04-06 ENCOUNTER — Telehealth: Payer: Self-pay | Admitting: *Deleted

## 2018-04-06 ENCOUNTER — Other Ambulatory Visit (INDEPENDENT_AMBULATORY_CARE_PROVIDER_SITE_OTHER): Payer: Medicare Other

## 2018-04-06 ENCOUNTER — Encounter: Payer: Self-pay | Admitting: Neurology

## 2018-04-06 ENCOUNTER — Ambulatory Visit (INDEPENDENT_AMBULATORY_CARE_PROVIDER_SITE_OTHER): Payer: Medicare Other | Admitting: Neurology

## 2018-04-06 VITALS — BP 130/88 | HR 104 | Ht 67.0 in | Wt 215.0 lb

## 2018-04-06 DIAGNOSIS — G629 Polyneuropathy, unspecified: Secondary | ICD-10-CM | POA: Diagnosis not present

## 2018-04-06 DIAGNOSIS — F339 Major depressive disorder, recurrent, unspecified: Secondary | ICD-10-CM | POA: Diagnosis not present

## 2018-04-06 DIAGNOSIS — E538 Deficiency of other specified B group vitamins: Secondary | ICD-10-CM

## 2018-04-06 DIAGNOSIS — R7989 Other specified abnormal findings of blood chemistry: Secondary | ICD-10-CM

## 2018-04-06 LAB — VITAMIN B12: Vitamin B-12: 220 pg/mL (ref 211–911)

## 2018-04-06 MED ORDER — TIZANIDINE HCL 2 MG PO TABS: 2.0000 mg | ORAL_TABLET | Freq: Every evening | ORAL | 3 refills | Status: DC | PRN

## 2018-04-06 NOTE — Progress Notes (Signed)
Follow-up Visit   Date: 04/06/18    Taylor Gay MRN: [id number] DOB: 1946-01-13   Interim History: Taylor Gay is a 72 y.o. right-handed Caucasian female with migraine, anxiety, hypertension, lumbar spinal stenosis, depression, IBS returning to the clinic for follow-up of neuropathy and new complaints of memory changes.  The patient was accompanied to the clinic by self.  History of present illness: She reports having three spells of leg weakness (1 and 2) she was walking through her home and legs gave out and (3) she was walking through a parking lot at her apartment and bilateral legs gave out.  She has no preceding warning signs such as lightheadedness, tingling, or sensation of heaviness. With her first two spells, she was easily able to get back up.  However, with her most recent episode, she had to wait about 5 minutes before she could stand, but some of this is due to the fact that she fell on her knees and legs under her which caused a great deal of pain.  Once she stood up, she was a little unsteady.  There was no confusion, bowel/urinary incontinence.  She has been using a cane since her fall.  She fractured right foot and suffered an abrasion to her right knee.   She also complains of numbness of the legs for the past few years.  She has difficulty with driving and cannot always feel the pedals.  She denies any personal history of alcohol use or diabetes.  Mother had diabetic neuropathy in her later years.   She also complains of a migraine which started two days ago.  She has migraines about once every 4 months.  [facility] and was taking Darvocet, which helped.   UPDATE 10/06/2017:  She is here for 5 month follow-up visit.  She had L3 laminectomy in August and has not had any further spells of leg weakness.  Today, she is mostly bothered by her nausea and is being treated for bronchitis with antibiotics.  She continues to have numbness of  the feet and has slipped her feet of the foot pedals in her car.  I have asked her to stop driving at her last visit and she is waiting for her son to get his licence back.    She has problems with short-term memory, such as remembering tasks and especially what she is trying to say.  Sometimes, she forgets words.  At other times, she may be reading something and unable to vocalize the words.  She drives, manages own finances and medications. She has some difficulty with household chores because of low back pain.  She has history of depression and takes Cymbalta 32m for this.  She takes percocet 7.573mat bedtime for low back pain.   UPDATE 04/06/2018:  She is here for 6 month follow-up visit.  She had formal neurocognitive testing which showed severe major depression, no evidence of dementia.  She is reassured by not having dementia, but is continuously forgetting appointments and endorses low mood.  She is also concerned about new rash that has come up on her elbows, abdomen, and legs and has been seeing dermatology and rheumatology.  There has been no new changes with her neuropathy; fortunately, she has not had any falls.  She continues to drive very cautiously.  She also complains of low back pain and has seen Dr. NuKathyrn Sheriffor this in the past.   Medications:  Current Outpatient Medications  on File Prior to Visit  Medication Sig Dispense Refill  . Aspirin-Acetaminophen-Caffeine (GOODY HEADACHE PO) Take 1 Package by mouth 5 (five) times daily as needed (HEADACHE).     . Blood Glucose Monitoring Suppl (ONETOUCH VERIO) w/Device KIT 1 kit by Does not apply route as directed. 1 kit 0  . cholecalciferol (VITAMIN D) 1000 units tablet Take 1,000 Units by mouth daily.    . Cyanocobalamin (VITAMIN B 12 PO) Take 1 tablet by mouth daily.    . DULoxetine (CYMBALTA) 60 MG capsule Take 1 capsule (60 mg total) by mouth daily. 90 capsule 3  . etodolac (LODINE) 500 MG tablet Take 500 mg by mouth 2 (two) times  daily.    . furosemide (LASIX) 20 MG tablet Take one daily as needed for increased leg edema. 30 tablet 3  . glucose blood (ONETOUCH VERIO) test strip Use once daily.  Dx pre-diabetes 100 each 12  . meclizine (ANTIVERT) 12.5 MG tablet Take 1 tablet (12.5 mg total) by mouth 2 (two) times daily as needed for dizziness. 15 tablet 0  . Menthol-Zinc Oxide (GOLD BOND EX) Apply 1 application topically as needed (itchy patches).    . mupirocin ointment (BACTROBAN) 2 % Place 1 application into the nose 2 (two) times daily. (Patient taking differently: Place 1 application into the nose 2 (two) times daily as needed (CUTS). ) 22 g 1  . nystatin cream (MYCOSTATIN) Apply 1 application topically 2 (two) times daily as needed for dry skin. 30 g 1  . ranitidine (ZANTAC) 300 MG tablet Take 1 tablet (300 mg total) by mouth 2 (two) times daily. 180 tablet 1  . triamterene-hydrochlorothiazide (MAXZIDE) 75-50 MG tablet TAKE 1 TABLET BY MOUTH DAILY. 90 tablet 1  . VENTOLIN HFA 108 (90 Base) MCG/ACT inhaler USE 2 PUFFS EVERY 6 HOURS AS NEEDED FOR WHEEZING. 18 g 2   No current facility-administered medications on file prior to visit.     Allergies:  Allergies  Allergen Reactions  . Amitriptyline Hcl Shortness Of Breath and Other (See Comments)    REACTION: SOB, could not move, felt like her heart stopped, felt like she was dying   . Lyrica [Pregabalin] Anaphylaxis and Swelling  . Neomycin-Bacitracin Zn-Polymyx Anaphylaxis, Shortness Of Breath, Swelling and Other (See Comments)    Went to the dermatologist and had something removed and the dermatologist put neosporin on the place he removed and pt's neck and throat started swelling and she was short of breath. Told to never use again, told she was allergic to the polysporin in neosporin, pt uses mupirocin ointment   . Polysporin [Bacitracin-Polymyxin B] Anaphylaxis, Shortness Of Breath, Swelling and Other (See Comments)    Went to the dermatologist and had something  removed and the dermatologist put neosporin on the place he removed and pt's neck and throat started swelling and she was short of breath. Told to never use again, told she was allergic to the polysporin in neosporin, pt uses mupirocin ointment   . Flexeril [Cyclobenzaprine Hcl] Other (See Comments)    Hangover feelings  . Morphine Other (See Comments)    Chest pain  ...can take hydrocodone   . Rabeprazole Sodium Swelling and Other (See Comments)    SWELLING REACTION UNSPECIFIED   . Codeine Other (See Comments)    Makes her feel really bad, like a really bad hangover   . Flexeril [Cyclobenzaprine] Nausea Only and Other (See Comments)    "Hangover" feeling  . Omeprazole Nausea And Vomiting and Other (See  Comments)    UPSET STOMACH  . Pantoprazole Sodium Diarrhea  . Tramadol Nausea Only and Other (See Comments)     "Hangover"  "Doesn't help the pain "    Review of Systems:  CONSTITUTIONAL: No fevers, chills, night sweats, or weight loss.  EYES: No visual changes or eye pain ENT: No hearing changes.  No history of nose bleeds.   RESPIRATORY: No cough, wheezing and shortness of breath.   CARDIOVASCULAR: Negative for chest pain, and palpitations.   GI: Negative for abdominal discomfort, blood in stools or black stools.  No recent change in bowel habits.   GU:  No history of incontinence.   MUSCLOSKELETAL: +history of joint pain or swelling.  No myalgias.   SKIN: Negative for lesions, + rash, +itching.   ENDOCRINE: Negative for cold or heat intolerance, polydipsia or goiter.   PSYCH:  + depression +anxiety symptoms.   NEURO: As Above.   Vital Signs:  BP 130/88   Pulse (!) 104   Ht _0  (1.702 m)   Wt 215 lb (97.5 kg)   SpO2 92%   BMI 33.67 kg/m  Pain Scale: 0 on a scale of 0-10  General Medical Exam:   General:  Well appearing, anxious  Eyes/ENT: see cranial nerve examination.   Neck: No masses appreciated.  Full range of motion without tenderness.  No carotid  bruits. Respiratory:  Clear to auscultation, good air entry bilaterally.   Cardiac:  Regular rate and rhythm, no murmur.   Ext;  Erythematous rash over the elbows and face  Neurological Exam: MENTAL STATUS including orientation to time, place, person, recent and remote memory, attention span and concentration, language, and fund of knowledge is intact.  Montreal Cognitive Assessment  10/06/2017  Visuospatial/ Executive (0/5) 3  Naming (0/3) 3  Attention: Read list of digits (0/2) 2  Attention: Read list of letters (0/1) 1  Attention: Serial 7 subtraction starting at 100 (0/3) 3  Language: Repeat phrase (0/2) 2  Language : Fluency (0/1) 1  Abstraction (0/2) 2  Delayed Recall (0/5) 4  Orientation (0/6) 6  Total 27  Adjusted Score (based on education) 28   CRANIAL NERVES: Pupils equal round and reactive to light.  Normal conjugate, extra-ocular eye movements in all directions of gaze.  No ptosis.  Face is symmetric.   MOTOR:  Motor strength is 5/5 in all extremities. No pronator drift.  Tone is normal.    SENSORY:  Vibration is diminished at the ankles bilaterally.  COORDINATION/GAIT:    Gait slightly wide-based and stable.   Data: NCS/EMG of the legs 05/11/2017: The electrophysiologic findings are most consistent with a symmetric and chronic sensorimotor polyneuropathy, axon loss in type, affecting the lower extremities. Overall, these findings are moderate in degree electrically.  MRI/A brain 05/12/2017: 1. No acute intracranial abnormality. 2. Nonspecific foci of T2 FLAIR hyperintense signal abnormality predominantly in subcortical white matter most consistent with mild chronic microvascular ischemic changes. No specific findings for demyelination. 3. Mild brain parenchymal volume loss. 4. Patent circle of Willis. No high-grade stenosis, large vessel occlusion, or aneurysm identified.  MRI lumbar spine wo contrast 03/17/2017:  Transitional lumbosacral anatomy. Numbering scheme is  the same as that used on the prior study.  No change in spondylosis appearing worst at L3-4 where there is moderate central canal, right worse than left lateral recess and left worse than right foraminal stenosis.  MRI thoracic spine wo contrast 03/17/2017:  Very small left paracentral protrusions T5-6 and T6-7 without  central canal or foraminal narrowing. The examination is otherwise negative.  Lab Results  Component Value Date   TSH 2.06 12/24/2017   Lab Results  Component Value Date   VITAMINB12 236 04/28/2017     IMPRESSION/PLAN: 1.  Idiopathic peripheral neuropathy manifesting with numbness.  Clinically, stable without worsening.  She does not have pain associated with neuropathy.  We have discussed driving safety in the past and I have recommneded hand controls because of inability to manipulate pedals.  Recommend driving local distances, daytime, and avoid heavy traffic times.  Continue to use a cane as needed.  Fortunately, she has not suffered any falls.  2.  Low vitamin B12.  She admits being noncompliant with her vitamin B12 supplements.  Levels will be rechecked as she was very borderline-normal in the past.    3.  Cognitive changes due to severe major depressive disorder, no sign of neurocognitive disorder on formal neurocognitive testing. These results were reviewed with patient.  Recommend follow-up with Dr. Elease Hashimoto for medication management.   4.  Chronic low back pain.  Offered to start tizanidine 7m at bedtime.  She is followed by Dr. NKathyrn Sheriffand is s/p L3-4 laminectomy   Return to clinic as needed  Greater than 50% of this 30 minute visit was spent in counseling, explanation of diagnosis, planning of further management, and coordination of care.   Thank you for allowing me to participate in patient's care.  If I can answer any additional questions, I would be pleased to do so.    Sincerely,    Darious Rehman K. PPosey Pronto DO

## 2018-04-06 NOTE — Patient Instructions (Addendum)
Start tizanidine 2mg  at bedtime for low back pain  Check vitamin B12 level  Use cane as needed  Restricts driving to daytime, local distances, and avoid heavy traffic times  Return to clinic as needed

## 2018-04-06 NOTE — Telephone Encounter (Signed)
Patient given the results and will start the injections on Monday.

## 2018-04-06 NOTE — Telephone Encounter (Signed)
-----   Message from [place] Chardonika K Patel, DO sent at 04/06/2018 12:26 PM EDT ----- Please inform patient that her vitamin B12 levels are low and I would recommend starting vitmain B12 injections Vitamin B12 1000mcg IM injection daily x 7 days, weekly x 4 weeks, then monthly thereafter x 1 year.  If she is unable to do injections, please encourage her to take her supplements 1000mcg daily.

## 2018-04-11 ENCOUNTER — Ambulatory Visit (INDEPENDENT_AMBULATORY_CARE_PROVIDER_SITE_OTHER): Payer: Medicare Other

## 2018-04-11 ENCOUNTER — Encounter: Payer: Self-pay | Admitting: Family Medicine

## 2018-04-11 DIAGNOSIS — E538 Deficiency of other specified B group vitamins: Secondary | ICD-10-CM

## 2018-04-11 MED ORDER — CYANOCOBALAMIN 1000 MCG/ML IJ SOLN
1000.0000 ug | Freq: Once | INTRAMUSCULAR | Status: AC
  Administered 2018-04-11: 1000 ug via INTRAMUSCULAR

## 2018-04-12 ENCOUNTER — Ambulatory Visit (INDEPENDENT_AMBULATORY_CARE_PROVIDER_SITE_OTHER): Payer: Medicare Other

## 2018-04-12 DIAGNOSIS — E538 Deficiency of other specified B group vitamins: Secondary | ICD-10-CM | POA: Diagnosis not present

## 2018-04-12 MED ORDER — CYANOCOBALAMIN 1000 MCG/ML IJ SOLN
1000.0000 ug | Freq: Once | INTRAMUSCULAR | Status: AC
  Administered 2018-04-12: 1000 ug via INTRAMUSCULAR

## 2018-04-13 ENCOUNTER — Ambulatory Visit (INDEPENDENT_AMBULATORY_CARE_PROVIDER_SITE_OTHER): Payer: Medicare Other | Admitting: *Deleted

## 2018-04-13 DIAGNOSIS — E538 Deficiency of other specified B group vitamins: Secondary | ICD-10-CM

## 2018-04-13 MED ORDER — CYANOCOBALAMIN 1000 MCG/ML IJ SOLN
1000.0000 ug | Freq: Once | INTRAMUSCULAR | Status: AC
  Administered 2018-04-13: 1000 ug via INTRAMUSCULAR

## 2018-04-14 ENCOUNTER — Ambulatory Visit (INDEPENDENT_AMBULATORY_CARE_PROVIDER_SITE_OTHER): Payer: Medicare Other | Admitting: *Deleted

## 2018-04-14 DIAGNOSIS — E538 Deficiency of other specified B group vitamins: Secondary | ICD-10-CM

## 2018-04-14 MED ORDER — CYANOCOBALAMIN 1000 MCG/ML IJ SOLN
1000.0000 ug | Freq: Once | INTRAMUSCULAR | Status: AC
  Administered 2018-04-14: 1000 ug via INTRAMUSCULAR

## 2018-04-15 ENCOUNTER — Ambulatory Visit (INDEPENDENT_AMBULATORY_CARE_PROVIDER_SITE_OTHER): Payer: Medicare Other | Admitting: *Deleted

## 2018-04-15 DIAGNOSIS — E538 Deficiency of other specified B group vitamins: Secondary | ICD-10-CM

## 2018-04-15 MED ORDER — CYANOCOBALAMIN 1000 MCG/ML IJ SOLN
1000.0000 ug | Freq: Once | INTRAMUSCULAR | Status: AC
  Administered 2018-04-15: 1000 ug via INTRAMUSCULAR

## 2018-04-18 ENCOUNTER — Ambulatory Visit (INDEPENDENT_AMBULATORY_CARE_PROVIDER_SITE_OTHER): Payer: Medicare Other

## 2018-04-18 DIAGNOSIS — E538 Deficiency of other specified B group vitamins: Secondary | ICD-10-CM

## 2018-04-18 MED ORDER — CYANOCOBALAMIN 1000 MCG/ML IJ SOLN
1000.0000 ug | Freq: Once | INTRAMUSCULAR | Status: AC
  Administered 2018-04-18: 1000 ug via INTRAMUSCULAR

## 2018-04-18 NOTE — Progress Notes (Signed)
b12

## 2018-04-19 ENCOUNTER — Ambulatory Visit (INDEPENDENT_AMBULATORY_CARE_PROVIDER_SITE_OTHER): Payer: Medicare Other | Admitting: *Deleted

## 2018-04-19 DIAGNOSIS — E538 Deficiency of other specified B group vitamins: Secondary | ICD-10-CM | POA: Diagnosis not present

## 2018-04-19 MED ORDER — CYANOCOBALAMIN 1000 MCG/ML IJ SOLN
1000.0000 ug | Freq: Once | INTRAMUSCULAR | Status: AC
  Administered 2018-04-19: 1000 ug via INTRAMUSCULAR

## 2018-04-25 ENCOUNTER — Telehealth: Payer: Self-pay | Admitting: Family Medicine

## 2018-04-25 NOTE — Telephone Encounter (Signed)
Recommend follow up so we can discuss how to taper off.

## 2018-04-25 NOTE — Telephone Encounter (Signed)
Copied from CRM [id number]. Topic: Quick Communication - See Telephone Encounter >> Apr 25, 2018  8:06 AM Eston Mould B wrote: CRM for notification. See Telephone encounter for: 04/25/18.  PT is requesting her medication be changed from DULoxetine (CYMBALTA) 60 MG capsule   to lexapro    She states the Cymbalta does not help with pain and depression  [facility] - [place], [place] - [address] [phone] (Phone) [phone] (Fax)

## 2018-04-25 NOTE — Telephone Encounter (Signed)
Appointment made

## 2018-04-26 ENCOUNTER — Ambulatory Visit: Payer: Medicare Other | Admitting: Family Medicine

## 2018-04-26 ENCOUNTER — Ambulatory Visit (INDEPENDENT_AMBULATORY_CARE_PROVIDER_SITE_OTHER): Payer: Medicare Other | Admitting: *Deleted

## 2018-04-26 ENCOUNTER — Encounter: Payer: Self-pay | Admitting: Family Medicine

## 2018-04-26 VITALS — BP 124/80 | HR 86 | Temp 98.1°F | Wt 216.0 lb

## 2018-04-26 DIAGNOSIS — M545 Low back pain: Secondary | ICD-10-CM

## 2018-04-26 DIAGNOSIS — E538 Deficiency of other specified B group vitamins: Secondary | ICD-10-CM | POA: Diagnosis not present

## 2018-04-26 DIAGNOSIS — F4321 Adjustment disorder with depressed mood: Secondary | ICD-10-CM | POA: Diagnosis not present

## 2018-04-26 DIAGNOSIS — G8929 Other chronic pain: Secondary | ICD-10-CM

## 2018-04-26 MED ORDER — GLUCOSE BLOOD VI STRP: ORAL_STRIP | 12 refills | Status: AC

## 2018-04-26 MED ORDER — ESCITALOPRAM OXALATE 10 MG PO TABS: 10.0000 mg | ORAL_TABLET | Freq: Every day | ORAL | 5 refills | Status: DC

## 2018-04-26 MED ORDER — KETOROLAC TROMETHAMINE 60 MG/2ML IM SOLN
60.0000 mg | Freq: Once | INTRAMUSCULAR | Status: AC
  Administered 2018-04-26: 60 mg via INTRAMUSCULAR

## 2018-04-26 MED ORDER — CYANOCOBALAMIN 1000 MCG/ML IJ SOLN
1000.0000 ug | Freq: Once | INTRAMUSCULAR | Status: AC
  Administered 2018-04-26: 1000 ug via INTRAMUSCULAR
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
## 2018-04-26 NOTE — Patient Instructions (Signed)
Start the Cymbalta 30 mg daily for one week and then discontinue  After D/C the Cymbalta may start the Lexapro 10 mg once daily.

## 2018-04-27 ENCOUNTER — Encounter: Payer: Self-pay | Admitting: Family Medicine

## 2018-05-03 ENCOUNTER — Ambulatory Visit (INDEPENDENT_AMBULATORY_CARE_PROVIDER_SITE_OTHER): Payer: Medicare Other | Admitting: *Deleted

## 2018-05-03 DIAGNOSIS — E538 Deficiency of other specified B group vitamins: Secondary | ICD-10-CM

## 2018-05-03 MED ORDER — CYANOCOBALAMIN 1000 MCG/ML IJ SOLN
1000.0000 ug | Freq: Once | INTRAMUSCULAR | Status: AC
  Administered 2018-05-03: 1000 ug via INTRAMUSCULAR

## 2018-05-10 ENCOUNTER — Ambulatory Visit (INDEPENDENT_AMBULATORY_CARE_PROVIDER_SITE_OTHER): Payer: Medicare Other | Admitting: *Deleted

## 2018-05-10 ENCOUNTER — Ambulatory Visit: Payer: Medicare Other

## 2018-05-10 DIAGNOSIS — E538 Deficiency of other specified B group vitamins: Secondary | ICD-10-CM

## 2018-05-10 MED ORDER — CYANOCOBALAMIN 1000 MCG/ML IJ SOLN
1000.0000 ug | Freq: Once | INTRAMUSCULAR | Status: AC
  Administered 2018-05-10: 1000 ug via INTRAMUSCULAR

## 2018-05-17 ENCOUNTER — Ambulatory Visit (INDEPENDENT_AMBULATORY_CARE_PROVIDER_SITE_OTHER): Payer: Medicare Other | Admitting: *Deleted

## 2018-05-17 DIAGNOSIS — E538 Deficiency of other specified B group vitamins: Secondary | ICD-10-CM | POA: Diagnosis not present

## 2018-05-17 MED ORDER — CYANOCOBALAMIN 1000 MCG/ML IJ SOLN
1000.0000 ug | Freq: Once | INTRAMUSCULAR | Status: AC
  Administered 2018-05-17: 1000 ug via INTRAMUSCULAR

## 2018-05-23 ENCOUNTER — Other Ambulatory Visit: Payer: Self-pay

## 2018-05-23 DIAGNOSIS — L309 Dermatitis, unspecified: Secondary | ICD-10-CM | POA: Diagnosis not present

## 2018-05-23 DIAGNOSIS — L820 Inflamed seborrheic keratosis: Secondary | ICD-10-CM | POA: Diagnosis not present

## 2018-05-23 DIAGNOSIS — D485 Neoplasm of uncertain behavior of skin: Secondary | ICD-10-CM | POA: Diagnosis not present

## 2018-05-23 DIAGNOSIS — L308 Other specified dermatitis: Secondary | ICD-10-CM | POA: Diagnosis not present

## 2018-05-23 DIAGNOSIS — L304 Erythema intertrigo: Secondary | ICD-10-CM | POA: Diagnosis not present

## 2018-05-24 ENCOUNTER — Ambulatory Visit: Payer: Medicare Other | Admitting: Family Medicine

## 2018-05-24 ENCOUNTER — Encounter: Payer: Self-pay | Admitting: Family Medicine

## 2018-05-24 VITALS — BP 120/80 | HR 72 | Temp 97.8°F | Wt 220.6 lb

## 2018-05-24 DIAGNOSIS — F4321 Adjustment disorder with depressed mood: Secondary | ICD-10-CM | POA: Diagnosis not present

## 2018-05-24 MED ORDER — BUPROPION HCL ER (XL) 150 MG PO TB24: 150.0000 mg | ORAL_TABLET | Freq: Every day | ORAL | 11 refills | Status: DC
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
## 2018-05-24 NOTE — Patient Instructions (Signed)
Start the Wellbutrin and be sure to take it in the morning.

## 2018-06-14 ENCOUNTER — Ambulatory Visit (INDEPENDENT_AMBULATORY_CARE_PROVIDER_SITE_OTHER): Payer: Medicare Other | Admitting: *Deleted

## 2018-06-14 DIAGNOSIS — E538 Deficiency of other specified B group vitamins: Secondary | ICD-10-CM

## 2018-06-14 MED ORDER — CYANOCOBALAMIN 1000 MCG/ML IJ SOLN
1000.0000 ug | Freq: Once | INTRAMUSCULAR | Status: AC
Start: 2018-06-14 — End: 2018-06-14
  Administered 2018-06-14: 1000 ug via INTRAMUSCULAR

## 2018-06-24 ENCOUNTER — Encounter: Payer: Self-pay | Admitting: Family Medicine

## 2018-06-24 ENCOUNTER — Ambulatory Visit: Payer: Medicare Other | Admitting: Family Medicine

## 2018-06-24 VITALS — BP 122/60 | Temp 98.0°F | Wt 215.0 lb

## 2018-06-24 DIAGNOSIS — F4321 Adjustment disorder with depressed mood: Secondary | ICD-10-CM

## 2018-06-24 MED ORDER — WELLBUTRIN XL 300 MG PO TB24: 300.0000 mg | ORAL_TABLET | Freq: Every day | ORAL | 3 refills | Status: DC
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
## 2018-07-19 ENCOUNTER — Ambulatory Visit (INDEPENDENT_AMBULATORY_CARE_PROVIDER_SITE_OTHER): Payer: Medicare Other | Admitting: *Deleted

## 2018-07-19 ENCOUNTER — Other Ambulatory Visit: Payer: Self-pay | Admitting: Family Medicine

## 2018-07-19 ENCOUNTER — Ambulatory Visit (INDEPENDENT_AMBULATORY_CARE_PROVIDER_SITE_OTHER): Payer: Medicare Other

## 2018-07-19 VITALS — BP 124/80 | HR 74 | Ht 69.0 in | Wt 209.0 lb

## 2018-07-19 DIAGNOSIS — E538 Deficiency of other specified B group vitamins: Secondary | ICD-10-CM

## 2018-07-19 DIAGNOSIS — Z0000 Encounter for general adult medical examination without abnormal findings: Secondary | ICD-10-CM

## 2018-07-19 MED ORDER — CYANOCOBALAMIN 1000 MCG/ML IJ SOLN
1000.0000 ug | Freq: Once | INTRAMUSCULAR | Status: AC
  Administered 2018-07-19: 1000 ug via INTRAMUSCULAR

## 2018-07-19 NOTE — Patient Instructions (Addendum)
Taylor Gay , Thank you for taking time to come for your Medicare Wellness Visit. I appreciate your ongoing commitment to your health goals. Please review the following plan we discussed and let me know if I can assist you in the future.   A1c was drawn 03/2017 and was good Will see Dr. Elease Hashimoto 8/5 for fup of this and other   Will schedule hearing screen [facility] - can assist with hearing aid x 1  No reviews  [facility]  [id number]  [phone]  [email or web address]  Will try to schedule an eye exam this year   Focus on where you and your son will live !   These are the goals we discussed: Goals    . Exercise 150 min/wk Moderate Activity     Try to find some type of exercise you enjoy.  Can chair yoga; or core exercise  Continue your walking in place x 30 seconds and then move arms        This is a list of the screening recommended for you and due dates:  Health Maintenance  Topic Date Due  .  Hepatitis C: One time screening is recommended by Center for Disease Control  (CDC) for  adults born from [birth year] through 1963.   12/23/2025*  . Flu Shot  07/21/2018  . Mammogram  12/21/2018  . Colon Cancer Screening  10/14/2020  . Tetanus Vaccine  12/23/2025  . DEXA scan (bone density measurement)  Completed  . Pneumonia vaccines  Completed  *Topic was postponed. The date shown is not the original due date.      Fall Prevention in the Home Falls can cause injuries. They can happen to people of all ages. There are many things you can do to make your home safe and to help prevent falls. What can I do on the outside of my home?  Regularly fix the edges of walkways and driveways and fix any cracks.  Remove anything that might make you trip as you walk through a door, such as a raised step or threshold.  Trim any bushes or trees on the  path to your home.  Use bright outdoor lighting.  Clear any walking paths of anything that might make someone trip, such as rocks or tools.  Regularly check to see if handrails are loose or broken. Make sure that both sides of any steps have handrails.  Any raised decks and porches should have guardrails on the edges.  Have any leaves, snow, or ice cleared regularly.  Use sand or salt on walking paths during winter.  Clean up any spills in your garage right away. This includes oil or grease spills. What can I do in the bathroom?  Use night lights.  Install grab bars by the toilet and in the tub and shower. Do not use towel bars as grab bars.  Use non-skid mats or decals in the tub or shower.  If you need to sit down in the shower, use a plastic, non-slip stool.  Keep the floor dry. Clean up any water that spills on the floor as soon as it happens.  Remove soap buildup in the tub or shower regularly.  Attach bath mats securely with double-sided non-slip rug tape.  Do not have throw rugs and other things on the floor that can make you trip. What can I do in the bedroom?  Use night lights.  Make sure that you have a  light by your bed that is easy to reach.  Do not use any sheets or blankets that are too big for your bed. They should not hang down onto the floor.  Have a firm chair that has side arms. You can use this for support while you get dressed.  Do not have throw rugs and other things on the floor that can make you trip. What can I do in the kitchen?  Clean up any spills right away.  Avoid walking on wet floors.  Keep items that you use a lot in easy-to-reach places.  If you need to reach something above you, use a strong step stool that has a grab bar.  Keep electrical cords out of the way.  Do not use floor polish or wax that makes floors slippery. If you must use wax, use non-skid floor wax.  Do not have throw rugs and other things on the floor that can  make you trip. What can I do with my stairs?  Do not leave any items on the stairs.  Make sure that there are handrails on both sides of the stairs and use them. Fix handrails that are broken or loose. Make sure that handrails are as long as the stairways.  Check any carpeting to make sure that it is firmly attached to the stairs. Fix any carpet that is loose or worn.  Avoid having throw rugs at the top or bottom of the stairs. If you do have throw rugs, attach them to the floor with carpet tape.  Make sure that you have a light switch at the top of the stairs and the bottom of the stairs. If you do not have them, ask someone to add them for you. What else can I do to help prevent falls?  Wear shoes that: ? Do not have high heels. ? Have rubber bottoms. ? Are comfortable and fit you well. ? Are closed at the toe. Do not wear sandals.  If you use a stepladder: ? Make sure that it is fully opened. Do not climb a closed stepladder. ? Make sure that both sides of the stepladder are locked into place. ? Ask someone to hold it for you, if possible.  Clearly mark and make sure that you can see: ? Any grab bars or handrails. ? First and last steps. ? Where the edge of each step is.  Use tools that help you move around (mobility aids) if they are needed. These include: ? Canes. ? Walkers. ? Scooters. ? Crutches.  Turn on the lights when you go into a dark area. Replace any light bulbs as soon as they burn out.  Set up your furniture so you have a clear path. Avoid moving your furniture around.  If any of your floors are uneven, fix them.  If there are any pets around you, be aware of where they are.  Review your medicines with your doctor. Some medicines can make you feel dizzy. This can increase your chance of falling. Ask your doctor what other things that you can do to help prevent falls. This information is not intended to replace advice given to you by your health care  provider. Make sure you discuss any questions you have with your health care provider. Document Released: 10/03/2009 Document Revised: 05/14/2016 Document Reviewed: 01/11/2015 Elsevier Interactive Patient Education  2018 [place] Maintenance, Female Adopting a healthy lifestyle and getting preventive care can go a long way to promote health and wellness.  Talk with your health care provider about what schedule of regular examinations is right for you. This is a good chance for you to check in with your provider about disease prevention and staying healthy. In between checkups, there are plenty of things you can do on your own. Experts have done a lot of research about which lifestyle changes and preventive measures are most likely to keep you healthy. Ask your health care provider for more information. Weight and diet Eat a healthy diet  Be sure to include plenty of vegetables, fruits, low-fat dairy products, and lean protein.  Do not eat a lot of foods high in solid fats, added sugars, or salt.  Get regular exercise. This is one of the most important things you can do for your health. ? Most adults should exercise for at least 150 minutes each week. The exercise should increase your heart rate and make you sweat (moderate-intensity exercise). ? Most adults should also do strengthening exercises at least twice a week. This is in addition to the moderate-intensity exercise.  Maintain a healthy weight  Body mass index (BMI) is a measurement that can be used to identify possible weight problems. It estimates body fat based on height and weight. Your health care provider can help determine your BMI and help you achieve or maintain a healthy weight.  For females 34 years of age and older: ? A BMI below 18.5 is considered underweight. ? A BMI of 18.5 to 24.9 is normal. ? A BMI of 25 to 29.9 is considered overweight. ? A BMI of 30 and above is considered obese.  Watch levels of  cholesterol and blood lipids  You should start having your blood tested for lipids and cholesterol at 70 years of age, then have this test every 5 years.  You may need to have your cholesterol levels checked more often if: ? Your lipid or cholesterol levels are high. ? You are older than 70 years of age. ? You are at high risk for heart disease.  Cancer screening Lung Cancer  Lung cancer screening is recommended for adults 49-3 years old who are at high risk for lung cancer because of a history of smoking.  A yearly low-dose CT scan of the lungs is recommended for people who: ? Currently smoke. ? Have quit within the past 15 years. ? Have at least a 30-pack-year history of smoking. A pack year is smoking an average of one pack of cigarettes a day for 1 year.  Yearly screening should continue until it has been 15 years since you quit.  Yearly screening should stop if you develop a health problem that would prevent you from having lung cancer treatment.  Breast Cancer  Practice breast self-awareness. This means understanding how your breasts normally appear and feel.  It also means doing regular breast self-exams. Let your health care provider know about any changes, no matter how small.  If you are in your 20s or 30s, you should have a clinical breast exam (CBE) by a health care provider every 1-3 years as part of a regular health exam.  If you are 74 or older, have a CBE every year. Also consider having a breast X-ray (mammogram) every year.  If you have a family history of breast cancer, talk to your health care provider about genetic screening.  If you are at high risk for breast cancer, talk to your health care provider about having an MRI and a mammogram every year.  Breast cancer  gene (BRCA) assessment is recommended for women who have family members with BRCA-related cancers. BRCA-related cancers include: ? Breast. ? Ovarian. ? Tubal. ? Peritoneal cancers.  Results  of the assessment will determine the need for genetic counseling and BRCA1 and BRCA2 testing.  Cervical Cancer Your health care provider may recommend that you be screened regularly for cancer of the pelvic organs (ovaries, uterus, and vagina). This screening involves a pelvic examination, including checking for microscopic changes to the surface of your cervix (Pap test). You may be encouraged to have this screening done every 3 years, beginning at age 62.  For women ages 53-65, health care providers may recommend pelvic exams and Pap testing every 3 years, or they may recommend the Pap and pelvic exam, combined with testing for human papilloma virus (HPV), every 5 years. Some types of HPV increase your risk of cervical cancer. Testing for HPV may also be done on women of any age with unclear Pap test results.  Other health care providers may not recommend any screening for nonpregnant women who are considered low risk for pelvic cancer and who do not have symptoms. Ask your health care provider if a screening pelvic exam is right for you.  If you have had past treatment for cervical cancer or a condition that could lead to cancer, you need Pap tests and screening for cancer for at least 20 years after your treatment. If Pap tests have been discontinued, your risk factors (such as having a new sexual partner) need to be reassessed to determine if screening should resume. Some women have medical problems that increase the chance of getting cervical cancer. In these cases, your health care provider may recommend more frequent screening and Pap tests.  Colorectal Cancer  This type of cancer can be detected and often prevented.  Routine colorectal cancer screening usually begins at 70 years of age and continues through 70 years of age.  Your health care provider may recommend screening at an earlier age if you have risk factors for colon cancer.  Your health care provider may also recommend using  home test kits to check for hidden blood in the stool.  A small camera at the end of a tube can be used to examine your colon directly (sigmoidoscopy or colonoscopy). This is done to check for the earliest forms of colorectal cancer.  Routine screening usually begins at age 59.  Direct examination of the colon should be repeated every 5-10 years through 70 years of age. However, you may need to be screened more often if early forms of precancerous polyps or small growths are found.  Skin Cancer  Check your skin from head to toe regularly.  Tell your health care provider about any new moles or changes in moles, especially if there is a change in a mole's shape or color.  Also tell your health care provider if you have a mole that is larger than the size of a pencil eraser.  Always use sunscreen. Apply sunscreen liberally and repeatedly throughout the day.  Protect yourself by wearing long sleeves, pants, a wide-brimmed hat, and sunglasses whenever you are outside.  Heart disease, diabetes, and high blood pressure  High blood pressure causes heart disease and increases the risk of stroke. High blood pressure is more likely to develop in: ? People who have blood pressure in the high end of the normal range (130-139/85-89 mm Hg). ? People who are overweight or obese. ? People who are African American.  If  you are 37-35 years of age, have your blood pressure checked every 3-5 years. If you are 39 years of age or older, have your blood pressure checked every year. You should have your blood pressure measured twice-once when you are at a hospital or clinic, and once when you are not at a hospital or clinic. Record the average of the two measurements. To check your blood pressure when you are not at a hospital or clinic, you can use: ? An automated blood pressure machine at a pharmacy. ? A home blood pressure monitor.  If you are between 52 years and 8 years old, ask your health care provider  if you should take aspirin to prevent strokes.  Have regular diabetes screenings. This involves taking a blood sample to check your fasting blood sugar level. ? If you are at a normal weight and have a low risk for diabetes, have this test once every three years after 70 years of age. ? If you are overweight and have a high risk for diabetes, consider being tested at a younger age or more often. Preventing infection Hepatitis B  If you have a higher risk for hepatitis B, you should be screened for this virus. You are considered at high risk for hepatitis B if: ? You were born in a country where hepatitis B is common. Ask your health care provider which countries are considered high risk. ? Your parents were born in a high-risk country, and you have not been immunized against hepatitis B (hepatitis B vaccine). ? You have HIV or AIDS. ? You use needles to inject street drugs. ? You live with someone who has hepatitis B. ? You have had sex with someone who has hepatitis B. ? You get hemodialysis treatment. ? You take certain medicines for conditions, including cancer, organ transplantation, and autoimmune conditions.  Hepatitis C  Blood testing is recommended for: ? Everyone born from [birth year] through 1963. ? Anyone with known risk factors for hepatitis C.  Sexually transmitted infections (STIs)  You should be screened for sexually transmitted infections (STIs) including gonorrhea and chlamydia if: ? You are sexually active and are younger than 70 years of age. ? You are older than 70 years of age and your health care provider tells you that you are at risk for this type of infection. ? Your sexual activity has changed since you were last screened and you are at an increased risk for chlamydia or gonorrhea. Ask your health care provider if you are at risk.  If you do not have HIV, but are at risk, it may be recommended that you take a prescription medicine daily to prevent HIV infection. This  is called pre-exposure prophylaxis (PrEP). You are considered at risk if: ? You are sexually active and do not regularly use condoms or know the HIV status of your partner(s). ? You take drugs by injection. ? You are sexually active with a partner who has HIV.  Talk with your health care provider about whether you are at high risk of being infected with HIV. If you choose to begin PrEP, you should first be tested for HIV. You should then be tested every 3 months for as long as you are taking PrEP. Pregnancy  If you are premenopausal and you may become pregnant, ask your health care provider about preconception counseling.  If you may become pregnant, take 400 to 800 micrograms (mcg) of folic acid every day.  If you want to prevent pregnancy, talk to  your health care provider about birth control (contraception). Osteoporosis and menopause  Osteoporosis is a disease in which the bones lose minerals and strength with aging. This can result in serious bone fractures. Your risk for osteoporosis can be identified using a bone density scan.  If you are 41 years of age or older, or if you are at risk for osteoporosis and fractures, ask your health care provider if you should be screened.  Ask your health care provider whether you should take a calcium or vitamin D supplement to lower your risk for osteoporosis.  Menopause may have certain physical symptoms and risks.  Hormone replacement therapy may reduce some of these symptoms and risks. Talk to your health care provider about whether hormone replacement therapy is right for you. Follow these instructions at home:  Schedule regular health, dental, and eye exams.  Stay current with your immunizations.  Do not use any tobacco products including cigarettes, chewing tobacco, or electronic cigarettes.  If you are pregnant, do not drink alcohol.  If you are breastfeeding, limit how much and how often you drink alcohol.  Limit alcohol intake  to no more than 1 drink per day for nonpregnant women. One drink equals 12 ounces of beer, 5 ounces of wine, or 1 ounces of hard liquor.  Do not use street drugs.  Do not share needles.  Ask your health care provider for help if you need support or information about quitting drugs.  Tell your health care provider if you often feel depressed.  Tell your health care provider if you have ever been abused or do not feel safe at home. This information is not intended to replace advice given to you by your health care provider. Make sure you discuss any questions you have with your health care provider. Document Released: 06/22/2011 Document Revised: 05/14/2016 Document Reviewed: 09/10/2015 Elsevier Interactive Patient Education  [employer].

## 2018-07-25 ENCOUNTER — Encounter: Payer: Self-pay | Admitting: Family Medicine

## 2018-07-25 ENCOUNTER — Ambulatory Visit: Payer: Medicare Other | Admitting: Family Medicine

## 2018-07-25 VITALS — BP 110/80 | HR 80 | Temp 98.0°F | Wt 208.7 lb

## 2018-07-25 DIAGNOSIS — F4321 Adjustment disorder with depressed mood: Secondary | ICD-10-CM

## 2018-07-25 DIAGNOSIS — R7303 Prediabetes: Secondary | ICD-10-CM | POA: Diagnosis not present

## 2018-07-25 DIAGNOSIS — R059 Cough, unspecified: Secondary | ICD-10-CM

## 2018-07-25 DIAGNOSIS — M545 Low back pain, unspecified: Secondary | ICD-10-CM

## 2018-07-25 DIAGNOSIS — R05 Cough: Secondary | ICD-10-CM

## 2018-07-25 LAB — POCT GLYCOSYLATED HEMOGLOBIN (HGB A1C): Hemoglobin A1C: 6.1 % — AB (ref 4.0–5.6)

## 2018-07-25 NOTE — Patient Instructions (Signed)
Congratulations on the weight loss.  Keep up the good work.
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
## 2018-08-16 ENCOUNTER — Ambulatory Visit (INDEPENDENT_AMBULATORY_CARE_PROVIDER_SITE_OTHER): Payer: Medicare Other | Admitting: *Deleted

## 2018-08-16 DIAGNOSIS — E538 Deficiency of other specified B group vitamins: Secondary | ICD-10-CM | POA: Diagnosis not present

## 2018-08-16 MED ORDER — CYANOCOBALAMIN 1000 MCG/ML IJ SOLN
1000.0000 ug | Freq: Once | INTRAMUSCULAR | Status: AC
  Administered 2018-08-16: 1000 ug via INTRAMUSCULAR

## 2018-08-24 ENCOUNTER — Other Ambulatory Visit: Payer: Self-pay | Admitting: Neurology

## 2018-09-13 ENCOUNTER — Ambulatory Visit: Payer: Medicare Other

## 2018-09-16 ENCOUNTER — Ambulatory Visit (INDEPENDENT_AMBULATORY_CARE_PROVIDER_SITE_OTHER): Payer: Medicare Other | Admitting: *Deleted

## 2018-09-16 DIAGNOSIS — E538 Deficiency of other specified B group vitamins: Secondary | ICD-10-CM | POA: Diagnosis not present

## 2018-09-16 MED ORDER — CYANOCOBALAMIN 1000 MCG/ML IJ SOLN
1000.0000 ug | Freq: Once | INTRAMUSCULAR | Status: AC
  Administered 2018-09-16: 1000 ug via INTRAMUSCULAR

## 2018-09-19 ENCOUNTER — Encounter: Payer: Self-pay | Admitting: Family Medicine

## 2018-09-27 ENCOUNTER — Ambulatory Visit (INDEPENDENT_AMBULATORY_CARE_PROVIDER_SITE_OTHER): Payer: Medicare Other | Admitting: Family Medicine

## 2018-09-27 ENCOUNTER — Other Ambulatory Visit: Payer: Self-pay

## 2018-09-27 ENCOUNTER — Encounter: Payer: Self-pay | Admitting: Family Medicine

## 2018-09-27 VITALS — BP 110/70 | HR 89 | Temp 97.8°F | Wt 203.6 lb

## 2018-09-27 DIAGNOSIS — F339 Major depressive disorder, recurrent, unspecified: Secondary | ICD-10-CM | POA: Diagnosis not present

## 2018-09-27 DIAGNOSIS — L820 Inflamed seborrheic keratosis: Secondary | ICD-10-CM | POA: Diagnosis not present

## 2018-09-27 DIAGNOSIS — I10 Essential (primary) hypertension: Secondary | ICD-10-CM | POA: Diagnosis not present

## 2018-09-27 DIAGNOSIS — Z23 Encounter for immunization: Secondary | ICD-10-CM | POA: Diagnosis not present

## 2018-09-27 NOTE — Patient Instructions (Signed)
Persistent Depressive Disorder, Adult Persistent depressive disorder (PDD) is a mental health condition that causes symptoms of low-level depression for 2 years or longer. It may also be called long-term (chronic) depression or dysthymia. PDD may include episodes of more severe depression that last for about 2 weeks (major depressive disorder or MDD). PDD can affect the way you think, feel, and sleep. This condition may also affect your relationships. You may be more likely to get sick if you have PDD. What are the causes? The exact cause of this condition is not known. PDD is most likely caused by a combination of things, which may include:  Genetic factors. These are traits that are passed along from parent to child.  Individual factors. Your personality, your behavior, and the way you handle your thoughts and feelings may contribute to PDD. This includes personality traits and behaviors learned from others.  Physical factors, such as: ? Differences in the part of your brain that controls emotion. This part of your brain may be different than it is in people who do not have PDD. ? Long-term (chronic) medical or psychiatric illnesses.  Social factors. Traumatic experiences or major life changes may play a role in the development of PDD.  What increases the risk? This condition is more likely to develop in women. The following factors may make you more likely to develop PDD:  A family history of depression.  Abnormally low levels of certain brain chemicals.  Traumatic events in childhood, especially abuse or the loss of a parent.  Being under a lot of stress, or long-term stress, especially from upsetting life experiences or losses.  A history of: ? Chronic physical illness. ? Other mental health disorders. ? Substance abuse.  Poor living conditions.  Experiencing social exclusion or discrimination on a regular basis.  What are the signs or symptoms? Symptoms of this condition  occur for most of the day, and may include:  Fatigue or low energy.  Eating too much or too little.  Sleeping too much or too little.  Restlessness or agitation.  Feelings of hopelessness.  Feeling worthless or guilty.  Anxiety.  Poor concentration or difficulty making decisions.  Low self-esteem.  Negative outlook.  Inability to have fun or experience pleasure.  Social withdrawal.  Unexplained physical complaints.  Irritability.  Aggressive behavior or anger.  How is this diagnosed? This condition may be diagnosed based on:  Your symptoms.  Your medical history, including your mental health history. This may involve tests to evaluate your mental health. You may be asked questions about your lifestyle, including any drug and alcohol use, and how long you have had symptoms of PDD.  A physical exam.  Blood tests to rule out other conditions.  You may be diagnosed with PDD if you have had a depressed mood for 2 years or longer, as well as other symptoms of depression. How is this treated? This condition is usually treated by mental health professionals, such as psychologists, psychiatrists, and clinical social workers. You may need more than one type of treatment. Treatment may include:  Psychotherapy. This is also called talk therapy or counseling. Types of psychotherapy include: ? Cognitive behavioral therapy (CBT). This type of therapy teaches you to recognize unhealthy feelings, thoughts, and behaviors, and replace them with positive thoughts and actions. ? Interpersonal therapy (IPT). This helps you to improve the way you relate to and communicate with others. ? Family therapy. This treatment includes members of your family.  Medicine to treat anxiety and   depression, or to help you control certain emotions and behaviors.  Lifestyle changes, such as: ? Limiting alcohol and drug use. ? Exercising regularly. ? Getting plenty of sleep. ? Making healthy eating  choices. ? Spending more time outdoors.  Follow these instructions at home: Activity  Return to your normal activities as told by your health care provider.  Exercise regularly and spend time outdoors as told by your health care provider. General instructions  Take over-the-counter and prescription medicines only as told by your health care provider.  Do not drink alcohol. If you drink alcohol, limit your alcohol intake to no more than 1 drink a day for nonpregnant women and 2 drinks a day for men. One drink equals 12 oz of beer, 5 oz of wine, or 1 oz of hard liquor. Alcohol can affect any antidepressant medicines you are taking. Talk to your health care provider about your alcohol use.  Eat a healthy diet and get plenty of sleep.  Find activities that you enjoy doing, and make time to do them.  Consider joining a support group. Your health care provider may be able to recommend a support group.  Keep all follow-up visits as told by your health care provider. This is important. Where to find more information: National Alliance on Mental Illness  www.nami.org  U.[place]. National Institute of Mental Health  www.nimh.nih.gov  National Suicide Prevention Lifeline  1-800-273-TALK (8255). This is free, 24-hour help.  Contact a health care provider if:  Your symptoms get worse.  You develop new symptoms.  You have trouble sleeping or doing your daily activities. Get help right away if:  You self-harm.  You have serious thoughts about hurting yourself or others.  You see, hear, taste, smell, or feel things that are not present (hallucinate). This information is not intended to replace advice given to you by your health care provider. Make sure you discuss any questions you have with your health care provider. Document Released: 11/23/2012 Document Revised: 08/06/2016 Document Reviewed: 06/20/2016 Elsevier Interactive Patient Education  2018 Elsevier Inc.  
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
## 2018-10-18 ENCOUNTER — Ambulatory Visit (INDEPENDENT_AMBULATORY_CARE_PROVIDER_SITE_OTHER): Payer: Medicare Other | Admitting: *Deleted

## 2018-10-18 DIAGNOSIS — E538 Deficiency of other specified B group vitamins: Secondary | ICD-10-CM

## 2018-10-18 MED ORDER — CYANOCOBALAMIN 1000 MCG/ML IJ SOLN
1000.0000 ug | Freq: Once | INTRAMUSCULAR | Status: AC
  Administered 2018-10-18: 1000 ug via INTRAMUSCULAR

## 2018-10-25 ENCOUNTER — Other Ambulatory Visit: Payer: Self-pay | Admitting: Neurology

## 2018-11-15 ENCOUNTER — Ambulatory Visit: Payer: Medicare Other

## 2018-11-16 ENCOUNTER — Ambulatory Visit (INDEPENDENT_AMBULATORY_CARE_PROVIDER_SITE_OTHER): Payer: Medicare Other | Admitting: Neurology

## 2018-11-16 DIAGNOSIS — E538 Deficiency of other specified B group vitamins: Secondary | ICD-10-CM

## 2018-11-16 MED ORDER — CYANOCOBALAMIN 1000 MCG/ML IJ SOLN
1000.0000 ug | Freq: Once | INTRAMUSCULAR | Status: AC
  Administered 2018-11-16: 1000 ug via INTRAMUSCULAR

## 2018-11-16 NOTE — Progress Notes (Signed)
B12 injection to right deltoid with no apparent complications.   

## 2018-12-20 ENCOUNTER — Ambulatory Visit: Payer: Medicare Other

## 2018-12-28 ENCOUNTER — Other Ambulatory Visit: Payer: Self-pay

## 2018-12-28 ENCOUNTER — Ambulatory Visit (INDEPENDENT_AMBULATORY_CARE_PROVIDER_SITE_OTHER): Payer: Medicare Other | Admitting: Family Medicine

## 2018-12-28 ENCOUNTER — Ambulatory Visit (INDEPENDENT_AMBULATORY_CARE_PROVIDER_SITE_OTHER): Payer: Medicare Other | Admitting: *Deleted

## 2018-12-28 ENCOUNTER — Encounter: Payer: Self-pay | Admitting: Family Medicine

## 2018-12-28 VITALS — BP 136/78 | HR 82 | Temp 97.7°F | Ht 66.0 in | Wt 209.2 lb

## 2018-12-28 DIAGNOSIS — Z0000 Encounter for general adult medical examination without abnormal findings: Secondary | ICD-10-CM | POA: Diagnosis not present

## 2018-12-28 DIAGNOSIS — E538 Deficiency of other specified B group vitamins: Secondary | ICD-10-CM | POA: Diagnosis not present

## 2018-12-28 LAB — LIPID PANEL
CHOLESTEROL: 187 mg/dL (ref 0–200)
HDL: 50.9 mg/dL (ref >39.00)
LDL Cholesterol: 97 mg/dL (ref 0–99)
NonHDL: 135.62
TRIGLYCERIDES: 193 mg/dL — AB (ref 0.0–149.0)
Total CHOL/HDL Ratio: 4
VLDL: 38.6 mg/dL (ref 0.0–40.0)

## 2018-12-28 LAB — BASIC METABOLIC PANEL
BUN: 30 mg/dL — ABNORMAL HIGH (ref 6–23)
CALCIUM: 9.6 mg/dL (ref 8.4–10.5)
CO2: 29 meq/L (ref 19–32)
CREATININE: 1.09 mg/dL (ref 0.40–1.20)
Chloride: 102 mEq/L (ref 96–112)
GFR: 52.61 mL/min — AB (ref >60.00)
Glucose, Bld: 104 mg/dL — ABNORMAL HIGH (ref 70–99)
Potassium: 4 mEq/L (ref 3.5–5.1)
SODIUM: 141 meq/L (ref 135–145)

## 2018-12-28 LAB — CBC WITH DIFFERENTIAL/PLATELET
BASOS PCT: 0.8 % (ref 0.0–3.0)
Basophils Absolute: 0.1 10*3/uL (ref 0.0–0.1)
Eosinophils Absolute: 0.2 10*3/uL (ref 0.0–0.7)
Eosinophils Relative: 3 % (ref 0.0–5.0)
HEMATOCRIT: 39.5 % (ref 36.0–46.0)
HEMOGLOBIN: 13.1 g/dL (ref 12.0–15.0)
LYMPHS PCT: 19.3 % (ref 12.0–46.0)
Lymphs Abs: 1.5 10*3/uL (ref 0.7–4.0)
MCHC: 33.2 g/dL (ref 30.0–36.0)
MCV: 85.8 fl (ref 78.0–100.0)
MONOS PCT: 5.6 % (ref 3.0–12.0)
Monocytes Absolute: 0.4 10*3/uL (ref 0.1–1.0)
NEUTROS ABS: 5.6 10*3/uL (ref 1.4–7.7)
Neutrophils Relative %: 71.3 % (ref 43.0–77.0)
PLATELETS: 275 10*3/uL (ref 150.0–400.0)
RBC: 4.6 Mil/uL (ref 3.87–5.11)
RDW: 14.1 % (ref 11.5–15.5)
WBC: 7.8 10*3/uL (ref 4.0–10.5)

## 2018-12-28 LAB — HEMOGLOBIN A1C: Hgb A1c MFr Bld: 6.1 % (ref 4.6–6.5)

## 2018-12-28 LAB — HEPATIC FUNCTION PANEL
ALT: 9 U/L (ref 0–35)
AST: 11 U/L (ref 0–37)
Albumin: 4.1 g/dL (ref 3.5–5.2)
Alkaline Phosphatase: 88 U/L (ref 39–117)
BILIRUBIN DIRECT: 0 mg/dL (ref 0.0–0.3)
TOTAL PROTEIN: 6.7 g/dL (ref 6.0–8.3)
Total Bilirubin: 0.2 mg/dL (ref 0.2–1.2)

## 2018-12-28 LAB — TSH: TSH: 2.79 u[IU]/mL (ref 0.35–4.50)

## 2018-12-28 MED ORDER — CYANOCOBALAMIN 1000 MCG/ML IJ SOLN
1000.0000 ug | Freq: Once | INTRAMUSCULAR | Status: AC
  Administered 2018-12-28: 1000 ug via INTRAMUSCULAR

## 2018-12-28 MED ORDER — WELLBUTRIN XL 300 MG PO TB24: 300.0000 mg | ORAL_TABLET | Freq: Every day | ORAL | 3 refills | Status: DC

## 2018-12-28 NOTE — Patient Instructions (Signed)
Set up repeat mammogram  
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
## 2018-12-29 ENCOUNTER — Encounter: Payer: Self-pay | Admitting: Family Medicine

## 2019-01-04 DIAGNOSIS — H35033 Hypertensive retinopathy, bilateral: Secondary | ICD-10-CM | POA: Diagnosis not present

## 2019-01-04 DIAGNOSIS — H26491 Other secondary cataract, right eye: Secondary | ICD-10-CM | POA: Diagnosis not present

## 2019-01-04 DIAGNOSIS — E119 Type 2 diabetes mellitus without complications: Secondary | ICD-10-CM | POA: Diagnosis not present

## 2019-01-04 DIAGNOSIS — Z961 Presence of intraocular lens: Secondary | ICD-10-CM | POA: Diagnosis not present

## 2019-01-11 ENCOUNTER — Telehealth: Payer: Self-pay | Admitting: Family Medicine

## 2019-01-13 ENCOUNTER — Other Ambulatory Visit: Payer: Self-pay

## 2019-01-13 MED ORDER — MUPIROCIN 2 % EX OINT: 1.0000 "application " | TOPICAL_OINTMENT | Freq: Two times a day (BID) | CUTANEOUS | 0 refills | Status: AC | PRN

## 2019-01-13 NOTE — Telephone Encounter (Signed)
Medication has been filled and sent to [facility].

## 2019-01-13 NOTE — Telephone Encounter (Signed)
Patient states she does not know why mupirocin ointment (BACTROBAN) 2 % [Pharmacy Med Name: MUPIROCIN 2% OINTMENT]. She said that she is very allergic to neosporin and has to have this. She said Dr [name] is very aware of this. She just finished up what she had and is completely out. She fell and scraped her elbow one week ago and she needs it for this

## 2019-01-13 NOTE — Telephone Encounter (Signed)
This was last filled in 2017. Please advise.

## 2019-01-13 NOTE — Telephone Encounter (Signed)
Refill OK

## 2019-01-17 ENCOUNTER — Ambulatory Visit (INDEPENDENT_AMBULATORY_CARE_PROVIDER_SITE_OTHER): Payer: Medicare Other | Admitting: *Deleted

## 2019-01-17 DIAGNOSIS — E538 Deficiency of other specified B group vitamins: Secondary | ICD-10-CM

## 2019-01-17 MED ORDER — CYANOCOBALAMIN 1000 MCG/ML IJ SOLN
1000.0000 ug | Freq: Once | INTRAMUSCULAR | Status: AC
  Administered 2019-01-17: 1000 ug via INTRAMUSCULAR

## 2019-01-25 DIAGNOSIS — H26491 Other secondary cataract, right eye: Secondary | ICD-10-CM | POA: Diagnosis not present

## 2019-01-31 DIAGNOSIS — Z803 Family history of malignant neoplasm of breast: Secondary | ICD-10-CM | POA: Diagnosis not present

## 2019-01-31 DIAGNOSIS — Z1231 Encounter for screening mammogram for malignant neoplasm of breast: Secondary | ICD-10-CM | POA: Diagnosis not present

## 2019-01-31 LAB — HM MAMMOGRAPHY

## 2019-02-14 ENCOUNTER — Ambulatory Visit (INDEPENDENT_AMBULATORY_CARE_PROVIDER_SITE_OTHER): Payer: Medicare Other | Admitting: *Deleted

## 2019-02-14 DIAGNOSIS — E538 Deficiency of other specified B group vitamins: Secondary | ICD-10-CM | POA: Diagnosis not present

## 2019-02-14 MED ORDER — CYANOCOBALAMIN 1000 MCG/ML IJ SOLN
1000.0000 ug | Freq: Once | INTRAMUSCULAR | Status: AC
  Administered 2019-02-14: 1000 ug via INTRAMUSCULAR

## 2019-02-21 ENCOUNTER — Other Ambulatory Visit: Payer: Self-pay | Admitting: Family Medicine

## 2019-02-27 ENCOUNTER — Telehealth: Payer: Self-pay | Admitting: Neurology

## 2019-02-27 NOTE — Telephone Encounter (Signed)
Please have patient see PCP for this or she can come in for a follow up visit with Dr. Allena Katz.

## 2019-02-27 NOTE — Telephone Encounter (Signed)
Patient called regarding needing to renew her Handicapped Placard. She wanted to know could she bring the letter to the office to have Dr. Allena Katz fill out for her? She said if she does not answer to leave a message. Thanks

## 2019-03-10 ENCOUNTER — Encounter: Payer: Self-pay | Admitting: Family Medicine

## 2019-03-14 ENCOUNTER — Encounter: Payer: Self-pay | Admitting: Family Medicine

## 2019-03-14 ENCOUNTER — Ambulatory Visit (INDEPENDENT_AMBULATORY_CARE_PROVIDER_SITE_OTHER): Payer: Medicare Other

## 2019-03-14 ENCOUNTER — Ambulatory Visit (INDEPENDENT_AMBULATORY_CARE_PROVIDER_SITE_OTHER): Payer: Medicare Other | Admitting: Family Medicine

## 2019-03-14 ENCOUNTER — Other Ambulatory Visit: Payer: Self-pay

## 2019-03-14 VITALS — BP 134/76 | HR 81 | Temp 97.0°F | Ht 66.0 in | Wt 210.8 lb

## 2019-03-14 DIAGNOSIS — M542 Cervicalgia: Secondary | ICD-10-CM

## 2019-03-14 DIAGNOSIS — L719 Rosacea, unspecified: Secondary | ICD-10-CM

## 2019-03-14 DIAGNOSIS — R296 Repeated falls: Secondary | ICD-10-CM | POA: Insufficient documentation

## 2019-03-14 MED ORDER — DOXYCYCLINE HYCLATE 50 MG PO CAPS: 50.0000 mg | ORAL_CAPSULE | Freq: Every day | ORAL | 0 refills | Status: DC
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
## 2019-03-14 NOTE — Patient Instructions (Signed)
Concussion, Adult    A concussion is a brain injury from a direct hit (blow) to the head or body. This blow causes the brain to shake quickly back and forth inside the skull. This can damage brain cells and cause chemical changes in the brain. A concussion may also be known as a mild traumatic brain injury (TBI).  Concussions are usually not life-threatening, but the effects of a concussion can be serious. If you have a concussion, you should be very careful to avoid having a second concussion.  What are the causes?  This condition is caused by:  A direct blow to your head, such as from running into another player during a game, being hit in a fight, or hitting your head on a hard surface.  Sudden movement of your body that causes your brain to move back and forth inside the skull, such as in a car crash.  What are the signs or symptoms?  The signs of a concussion can be hard to notice. Early on, they may be missed by you, family members, and health care providers. You may look fine on the outside, but may act or feel differently.  Symptoms are usually temporary, and for most adults, the symptoms improve in 7-10 days. Some symptoms may appear right away, but other symptoms may not show up for hours or days. If your symptoms last longer than normal, you may have post-concussion syndrome. Every head injury is different.  Physical symptoms  Headaches. This can include a feeling of pressure in the head or migraine-like symptoms.  Tiredness (fatigue).  Dizziness.  Problems with coordination or balance.  Vision or hearing problems.  Sensitivity to light or noise.  Nausea or vomiting.  Changes in eating or sleeping patterns.  Numbness or tingling.  Mental and emotional symptoms  Memory problems.  Trouble concentrating, organizing, or making decisions.  Slowness in thinking, acting or reacting, speaking, or reading.  Irritability or mood changes.  Anxiety or depression.  How is this diagnosed?  This condition is diagnosed  based on:  Your symptoms.  A description of your injury.  You may also have tests, including:  Imaging tests, such as a CT scan or MRI. These are done to look for signs of brain injury.  Neuropsychological tests. These measure your thinking, understanding, learning, and remembering abilities.  How is this treated?  Treatment for this condition includes:  Stopping sports or activity if you are injured. Anyone who hits their head or shows signs of a concussion should not return to sports or activities the same day, and they should not return until they are checked by a health care provider.  Physical and mental rest and careful observation, usually at home. Gradually return to your normal activities.  Medicine. You may be prescribed medicines to help with symptoms such as headaches, nausea, or difficulty sleeping.  Avoid taking opioid pain medicine while recovering from a concussion.  Avoiding alcohol and drugs. Alcohol and certain drugs may slow your recovery and can put you at risk of further injury.  Referral to a concussion clinic or rehabilitation center.  How fast you will recover from a concussion depends on many factors. Recovery can take time. It is important to wait to return to activity until a health care provider says that it is safe and your symptoms are completely gone.  Follow these instructions at home:  Activity  Limit activities that require a lot of thought or concentration, such as:  Doing homework or job-related   work.  Watching TV.  Working on the computer.  Playing memory games and puzzles.  Rest. Rest helps your brain heal. Make sure you:  Get plenty of sleep. Most adults should get 7-9 hours of sleep each night.  Rest during the day. Take naps or rest breaks when you feel tired.  Do not do high-risk activities that could cause a second concussion, such as riding a bike or playing sports. Having another concussion before the first one has healed can be dangerous.  Ask your health care provider  when you can return to your normal activities, such as school, work, athletics, and driving. Your ability to react may be slower after a brain injury. Never do these activities if you are dizzy. Your health care provider will likely give you a plan for gradually returning to activities.  General instructions  Take over-the-counter and prescription medicines only as told by your health care provider. Some medicines, such as blood thinners (anticoagulants) and aspirin, may increase the chance of complications, such as bleeding.  Do not drink alcohol until your health care provider says you can.  Watch your symptoms and tell others to do the same. Complications sometimes occur after a concussion. Older adults with a brain injury may have a higher risk of serious complications.  Tell your work manager, teachers, school nurse, school counselor, coach, or athletic trainer about your injury, symptoms, and restrictions. Tell them about what you can or cannot do. They should watch you for worsening symptoms.  Keep all follow-up visits as told by your health care provider. This is important.  How is this prevented?  Avoiding another brain injury is very important, especially as you recover. In rare cases, another injury can lead to permanent brain damage, brain swelling, or death. The risk of this is greatest during the first 7-10 days after a head injury. Avoid injuries by:  Avoiding activities that could lead to a second concussion, such as contact or recreational sports, until your health care provider says it is okay.  Taking these actions once you have returned to sports or activities:  Avoiding plays or moves that can cause you to crash into another person. This is how most concussions occur.  Following the rules and being respectful of other players. Do not engage in violent or illegal plays.  Getting regular exercise that includes strength and balance training.  Wear a properly fitting helmet during sports, biking, or  other activities. Helmets can help protect you from serious skull and brain injuries, but they do not protect you from a concussion. Even when wearing a helmet, you should avoid being hit in the head.  Contact a health care provider if:  Your symptoms get worse or they do not improve.  You have new symptoms.  You have another injury.  Get help right away if:  You have severe or worsening headaches.  You have weakness or numbness in any part of your body.  You are confused.  Your coordination gets worse.  You vomit repeatedly.  You are sleepier than normal.  You have convulsions.  Your speech is slurred.  You cannot recognize people or places.  You have a seizure.  It is difficult to wake you up.  You have unusual behavior changes.  You have changes in your vision.  You lose consciousness.  Summary  A concussion is a brain injury from a direct hit (blow) to your head or body.  You may have imaging tests and neuropsychological tests to diagnose   a concussion.  Treatment for this condition includes physical and mental rest and careful observation.  Ask your health care provider when you can return to your normal activities, such as school, work, athletics, and driving. Follow safety instructions as told by your health care provider.  This information is not intended to replace advice given to you by your health care provider. Make sure you discuss any questions you have with your health care provider.  Document Released: 02/27/2004 Document Revised: 01/13/2018 Document Reviewed: 01/13/2018  Elsevier Interactive Patient Education  2019 [employer]

## 2019-03-21 ENCOUNTER — Ambulatory Visit: Payer: Medicare Other

## 2019-03-25 ENCOUNTER — Other Ambulatory Visit: Payer: Self-pay | Admitting: Family Medicine

## 2019-04-18 ENCOUNTER — Ambulatory Visit: Payer: Medicare Other

## 2019-05-12 ENCOUNTER — Encounter: Payer: Self-pay | Admitting: Family Medicine

## 2019-05-12 ENCOUNTER — Telehealth: Payer: Self-pay | Admitting: Neurology

## 2019-05-12 NOTE — Telephone Encounter (Signed)
Patient was wanting to know since we arent doing B12 injections does she need to come later on for her 3 that she has missed? Please call her back. Thanks!

## 2019-05-16 ENCOUNTER — Ambulatory Visit: Payer: Medicare Other

## 2019-05-16 ENCOUNTER — Other Ambulatory Visit: Payer: Self-pay

## 2019-05-16 DIAGNOSIS — E538 Deficiency of other specified B group vitamins: Secondary | ICD-10-CM

## 2019-05-16 NOTE — Telephone Encounter (Signed)
Appt made for 08/21/19. Pt made aware to come one week prior to have B12 drawn.

## 2019-05-16 NOTE — Telephone Encounter (Signed)
Please inform pt to continue PO supplementation and try to be better about taking it daily.  We will plan to check level in 2 months and then decide whether she can continue by mouth or need injections. She will need a 2 month follow-up visit (last seen in 03/2018).

## 2019-05-16 NOTE — Telephone Encounter (Signed)
Yes, that would be great - 1 week before appt.

## 2019-05-16 NOTE — Telephone Encounter (Signed)
Do you want her to have labs prior to visit?

## 2019-05-16 NOTE — Telephone Encounter (Signed)
Pt was due for her final B12 injection this month. This would complete her year of B12 therapy. Per pt she has missed her last three doses.   She has been trying to take her B12 oral supplement which she admits to forgetting at times.  Pt wants to know if she needs to come in for an injection?

## 2019-06-13 ENCOUNTER — Other Ambulatory Visit: Payer: Self-pay | Admitting: Family Medicine

## 2019-06-13 NOTE — Telephone Encounter (Signed)
Yes.   Refill OK for 6 months.

## 2019-06-13 NOTE — Telephone Encounter (Signed)
Is this medication continuous for rosacea?

## 2019-07-25 ENCOUNTER — Ambulatory Visit: Payer: Medicare Other

## 2019-08-10 ENCOUNTER — Encounter: Payer: Self-pay | Admitting: Family Medicine

## 2019-08-15 ENCOUNTER — Other Ambulatory Visit (INDEPENDENT_AMBULATORY_CARE_PROVIDER_SITE_OTHER): Payer: Medicare Other

## 2019-08-15 ENCOUNTER — Other Ambulatory Visit: Payer: Self-pay

## 2019-08-15 DIAGNOSIS — E538 Deficiency of other specified B group vitamins: Secondary | ICD-10-CM | POA: Diagnosis not present

## 2019-08-16 LAB — VITAMIN B12: Vitamin B-12: 1577 pg/mL — ABNORMAL HIGH (ref 200–1100)

## 2019-08-18 ENCOUNTER — Encounter: Payer: Self-pay | Admitting: Neurology

## 2019-08-20 NOTE — Progress Notes (Signed)
Follow-up Visit   Date: 08/21/19    Taylor Gay MRN: [id number] DOB: Dec 06, 1946   Interim History: Taylor Gay is a 73 y.o. right-handed Caucasian female with migraine, anxiety, hypertension, lumbar spinal stenosis, depression, IBS returning to the clinic for follow-up of neuropathy and cognitive disorder.  The patient was accompanied to the clinic by self.  History of present illness: She has problems with short-term memory, such as remembering tasks and especially what she is trying to say.  Sometimes, she forgets words.  At other times, she may be reading something and unable to vocalize the words.  She drives, manages own finances and medications. She has some difficulty with household chores because of low back pain.  She has history of depression and takes Cymbalta 65m for this.  She takes percocet 7.519mat bedtime for low back pain.   UPDATE 04/06/2018:  She is here for 6 month follow-up visit.  She had formal neurocognitive testing which showed severe major depression, no evidence of dementia.  She is reassured by not having dementia, but is continuously forgetting appointments and endorses low mood.    UPDATE 08/21/2019:  She is here for 1 year follow-up visit.  She has noticed hand tremors which is worse with activity and improved with rest.  She denies any stiffness of the hands.  She continues to have mild memory problems with forgetfulness, but overall mood is much better.  She reports feeling happy. She is planning to get married soon.  She has been dating her 3rd cousin for the past year.  There is been no change with respect to the numbness in her feet.  She denies any painful paresthesias.  Balance is fair and she uses a cane as needed.  Medications:  Current Outpatient Medications on File Prior to Visit  Medication Sig Dispense Refill  . Aspirin-Acetaminophen-Caffeine (GOODY HEADACHE PO) Take 1 Package by mouth 5 (five) times daily as needed (HEADACHE).     . Blood  Glucose Monitoring Suppl (ONETOUCH VERIO) w/Device KIT 1 kit by Does not apply route as directed. 1 kit 0  . cholecalciferol (VITAMIN D) 1000 units tablet Take 1,000 Units by mouth daily.    . cyanocobalamin (,VITAMIN B-12,) 1000 MCG/ML injection Inject 1,000 mcg into the muscle once.    . doxycycline (VIBRAMYCIN) 50 MG capsule TAKE 1 CAPSULE DAILY. 90 capsule 1  . escitalopram (LEXAPRO) 10 MG tablet TAKE 1 TABLET BY MOUTH DAILY. 30 tablet 0  . furosemide (LASIX) 20 MG tablet Take one daily as needed for increased leg edema. 30 tablet 3  . glucose blood (ONETOUCH VERIO) test strip Use once daily.  Dx pre-diabetes 100 each 12  . halobetasol (ULTRAVATE) 0.05 % ointment   0  . meclizine (ANTIVERT) 12.5 MG tablet Take 1 tablet (12.5 mg total) by mouth 2 (two) times daily as needed for dizziness. 15 tablet 0  . ranitidine (ZANTAC) 300 MG tablet Take 1 tablet (300 mg total) by mouth 2 (two) times daily. 180 tablet 1  . tiZANidine (ZANAFLEX) 2 MG tablet Take 1 tablet (2 mg total) by mouth at bedtime as needed for muscle spasms. 30 tablet 3  . triamterene-hydrochlorothiazide (MAXZIDE) 75-50 MG tablet TAKE 1 TABLET BY MOUTH DAILY. 90 tablet 3  . VENTOLIN HFA 108 (90 Base) MCG/ACT inhaler USE 2 PUFFS EVERY 6 HOURS AS NEEDED FOR WHEEZING. 18 g 2  . WELLBUTRIN XL 300 MG 24 hr tablet Take 1 tablet (300 mg total) by mouth daily. 90[place]  tablet 3  . mupirocin ointment (BACTROBAN) 2 % Apply 1 application topically 2 (two) times daily as needed (CUTS). 1 g 0   No current facility-administered medications on file prior to visit.     Allergies:  Allergies  Allergen Reactions  . Amitriptyline Hcl Shortness Of Breath and Other (See Comments)    REACTION: SOB, could not move, felt like her heart stopped, felt like she was dying   . Lyrica [Pregabalin] Anaphylaxis and Swelling  . Neomycin-Bacitracin Zn-Polymyx Anaphylaxis, Shortness Of Breath, Swelling and Other (See Comments)    Went to the dermatologist and had  something removed and the dermatologist put neosporin on the place he removed and pt's neck and throat started swelling and she was short of breath. Told to never use again, told she was allergic to the polysporin in neosporin, pt uses mupirocin ointment   . Polysporin [Bacitracin-Polymyxin B] Anaphylaxis, Shortness Of Breath, Swelling and Other (See Comments)    Went to the dermatologist and had something removed and the dermatologist put neosporin on the place he removed and pt's neck and throat started swelling and she was short of breath. Told to never use again, told she was allergic to the polysporin in neosporin, pt uses mupirocin ointment   . Flexeril [Cyclobenzaprine Hcl] Other (See Comments)    Hangover feelings  . Morphine Other (See Comments)    Chest pain  ...can take hydrocodone   . Rabeprazole Sodium Swelling and Other (See Comments)    SWELLING REACTION UNSPECIFIED   . Codeine Other (See Comments)    Makes her feel really bad, like a really bad hangover   . Flexeril [Cyclobenzaprine] Nausea Only and Other (See Comments)    "Hangover" feeling  . Omeprazole Nausea And Vomiting and Other (See Comments)    UPSET STOMACH  . Pantoprazole Sodium Diarrhea  . Tramadol Nausea Only and Other (See Comments)     "Hangover"  "Doesn't help the pain "    Review of Systems:  CONSTITUTIONAL: No fevers, chills, night sweats, or weight loss.  EYES: No visual changes or eye pain ENT: No hearing changes.  No history of nose bleeds.   RESPIRATORY: No cough, wheezing and shortness of breath.   CARDIOVASCULAR: Negative for chest pain, and palpitations.   GI: Negative for abdominal discomfort, blood in stools or black stools.  No recent change in bowel habits.   GU:  No history of incontinence.   MUSCLOSKELETAL: +history of joint pain or swelling.  No myalgias.   SKIN: Negative for lesions.   ENDOCRINE: Negative for cold or heat intolerance, polydipsia or goiter.   PSYCH:  + depression +anxiety  symptoms.   NEURO: As Above.   Vital Signs:  BP 118/78   Pulse 82   Wt 215 lb (97.5 kg)   SpO2 98%   BMI 34.70 kg/m   General Medical Exam:   General:  Well appearing, comfortable  Eyes/ENT: see cranial nerve examination.   Neck:   No carotid bruits. Respiratory:  Clear to auscultation, good air entry bilaterally.   Cardiac:  Regular rate and rhythm, no murmur.   Ext:  No edema  Neurological Exam: MENTAL STATUS including orientation to time, place, person, recent and remote memory, attention span and concentration, language, and fund of knowledge is intact.   CRANIAL NERVES: Pupils equal round and reactive to light.  Normal conjugate, extra-ocular eye movements in all directions of gaze.  No ptosis.  Face is symmetric.   MOTOR:  Motor strength is  5/5 in all extremities. No pronator drift.  Tone is normal.  Course of action tremor affecting bilateral upper extremities and hands worse with arms outstretched and reduced at rest  SENSORY:  Vibration is diminished at the ankles bilaterally.  COORDINATION/GAIT:    Gait slightly wide-based and stable.   Data: NCS/EMG of the legs 05/11/2017: The electrophysiologic findings are most consistent with a symmetric and chronic sensorimotor polyneuropathy, axon loss in type, affecting the lower extremities. Overall, these findings are moderate in degree electrically.  MRI/A brain 05/12/2017: 1. No acute intracranial abnormality. 2. Nonspecific foci of T2 FLAIR hyperintense signal abnormality predominantly in subcortical white matter most consistent with mild chronic microvascular ischemic changes. No specific findings for demyelination. 3. Mild brain parenchymal volume loss. 4. Patent circle of Willis. No high-grade stenosis, large vessel occlusion, or aneurysm identified.  MRI lumbar spine wo contrast 03/17/2017:  Transitional lumbosacral anatomy. Numbering scheme is the same as that used on the prior study.  No change in spondylosis  appearing worst at L3-4 where there is moderate central canal, right worse than left lateral recess and left worse than right foraminal stenosis.  MRI thoracic spine wo contrast 03/17/2017:  Very small left paracentral protrusions T5-6 and T6-7 without central canal or foraminal narrowing. The examination is otherwise negative.  Lab Results  Component Value Date   VITAMINB12 1,577 (H) 08/15/2019     IMPRESSION/PLAN: 1.  Essential tremor affecting the hands - new  - Monitor for now  - Patient call when symptoms are bothersome enough to start medication  2.  Idiopathic peripheral neuropathy manifesting with numbness, stable. No associated pain  - Fall precautions discussed  - Continue to use a cane, especially on uneven ground  3.  Vitamin B12 deficiency - improved.  Continue vitamin B12 1044mg daily.  4.  Cognitive changes due to severe major depressive disorder, no sign of neurocognitive disorder on formal neurocognitive testing.   5.  Chronic low back pain s/p L3-4 laminectomy by Dr. NKathyrn Sheriff Stable.  Continue tizanidine 245mat bedtime as needed.   Return to clinic in 1 year  Greater than 50% of this 25 minute visit was spent in counseling, explanation of diagnosis, planning of further management, and coordination of care.   Thank you for allowing me to participate in patient's care.  If I can answer any additional questions, I would be pleased to do so.    Sincerely,     K. PaPosey ProntoDO

## 2019-08-21 ENCOUNTER — Ambulatory Visit (INDEPENDENT_AMBULATORY_CARE_PROVIDER_SITE_OTHER): Payer: Medicare Other | Admitting: Neurology

## 2019-08-21 ENCOUNTER — Other Ambulatory Visit: Payer: Self-pay

## 2019-08-21 ENCOUNTER — Encounter: Payer: Self-pay | Admitting: Neurology

## 2019-08-21 VITALS — BP 118/78 | HR 82 | Wt 215.0 lb

## 2019-08-21 DIAGNOSIS — M47816 Spondylosis without myelopathy or radiculopathy, lumbar region: Secondary | ICD-10-CM | POA: Diagnosis not present

## 2019-08-21 DIAGNOSIS — G629 Polyneuropathy, unspecified: Secondary | ICD-10-CM

## 2019-08-21 DIAGNOSIS — G250 Essential tremor: Secondary | ICD-10-CM

## 2019-08-21 DIAGNOSIS — E538 Deficiency of other specified B group vitamins: Secondary | ICD-10-CM

## 2019-08-21 MED ORDER — TIZANIDINE HCL 2 MG PO TABS: 2.0000 mg | ORAL_TABLET | Freq: Every evening | ORAL | 3 refills | Status: AC | PRN

## 2019-08-21 NOTE — Patient Instructions (Signed)
Call my office if your tremors get worse  Return to clinic in 1 year

## 2019-08-22 ENCOUNTER — Ambulatory Visit (INDEPENDENT_AMBULATORY_CARE_PROVIDER_SITE_OTHER): Payer: Medicare Other

## 2019-08-22 DIAGNOSIS — Z23 Encounter for immunization: Secondary | ICD-10-CM

## 2019-08-23 ENCOUNTER — Ambulatory Visit: Payer: Medicare Other | Admitting: Family Medicine

## 2019-09-05 ENCOUNTER — Other Ambulatory Visit: Payer: Self-pay | Admitting: Family Medicine

## 2019-10-31 ENCOUNTER — Other Ambulatory Visit: Payer: Self-pay | Admitting: Family Medicine

## 2019-11-22 DIAGNOSIS — R69 Illness, unspecified: Secondary | ICD-10-CM | POA: Diagnosis not present

## 2020-01-01 ENCOUNTER — Encounter: Payer: Medicare Other | Admitting: Family Medicine

## 2020-01-24 DIAGNOSIS — E119 Type 2 diabetes mellitus without complications: Secondary | ICD-10-CM | POA: Diagnosis not present

## 2020-01-24 DIAGNOSIS — H35033 Hypertensive retinopathy, bilateral: Secondary | ICD-10-CM | POA: Diagnosis not present

## 2020-01-24 DIAGNOSIS — Z961 Presence of intraocular lens: Secondary | ICD-10-CM | POA: Diagnosis not present

## 2020-01-24 DIAGNOSIS — H43813 Vitreous degeneration, bilateral: Secondary | ICD-10-CM | POA: Diagnosis not present

## 2020-02-14 ENCOUNTER — Encounter: Payer: Self-pay | Admitting: Family Medicine

## 2020-02-20 ENCOUNTER — Other Ambulatory Visit: Payer: Self-pay | Admitting: Family Medicine

## 2020-03-10 DIAGNOSIS — R69 Illness, unspecified: Secondary | ICD-10-CM | POA: Diagnosis not present

## 2020-03-14 ENCOUNTER — Encounter (HOSPITAL_COMMUNITY): Payer: Self-pay

## 2020-03-14 ENCOUNTER — Emergency Department (HOSPITAL_COMMUNITY): Payer: Medicare Other

## 2020-03-14 ENCOUNTER — Observation Stay (HOSPITAL_COMMUNITY)
Admission: EM | Admit: 2020-03-14 | Discharge: 2020-03-15 | Disposition: A | Discharge status: Home / Self Care | Payer: Medicare Other | Attending: Surgery | Admitting: Surgery

## 2020-03-14 ENCOUNTER — Other Ambulatory Visit: Payer: Self-pay

## 2020-03-14 DIAGNOSIS — I10 Essential (primary) hypertension: Secondary | ICD-10-CM | POA: Insufficient documentation

## 2020-03-14 DIAGNOSIS — R270 Ataxia, unspecified: Secondary | ICD-10-CM | POA: Diagnosis not present

## 2020-03-14 DIAGNOSIS — G629 Polyneuropathy, unspecified: Secondary | ICD-10-CM | POA: Insufficient documentation

## 2020-03-14 DIAGNOSIS — K589 Irritable bowel syndrome without diarrhea: Secondary | ICD-10-CM | POA: Insufficient documentation

## 2020-03-14 DIAGNOSIS — M1990 Unspecified osteoarthritis, unspecified site: Secondary | ICD-10-CM | POA: Diagnosis not present

## 2020-03-14 DIAGNOSIS — Z885 Allergy status to narcotic agent status: Secondary | ICD-10-CM | POA: Diagnosis not present

## 2020-03-14 DIAGNOSIS — Z6831 Body mass index (BMI) 31.0-31.9, adult: Secondary | ICD-10-CM | POA: Insufficient documentation

## 2020-03-14 DIAGNOSIS — R079 Chest pain, unspecified: Secondary | ICD-10-CM | POA: Diagnosis not present

## 2020-03-14 DIAGNOSIS — S2001XA Contusion of right breast, initial encounter: Secondary | ICD-10-CM | POA: Insufficient documentation

## 2020-03-14 DIAGNOSIS — S301XXA Contusion of abdominal wall, initial encounter: Secondary | ICD-10-CM | POA: Insufficient documentation

## 2020-03-14 DIAGNOSIS — R739 Hyperglycemia, unspecified: Secondary | ICD-10-CM | POA: Insufficient documentation

## 2020-03-14 DIAGNOSIS — E669 Obesity, unspecified: Secondary | ICD-10-CM | POA: Diagnosis not present

## 2020-03-14 DIAGNOSIS — R7303 Prediabetes: Secondary | ICD-10-CM | POA: Insufficient documentation

## 2020-03-14 DIAGNOSIS — Z888 Allergy status to other drugs, medicaments and biological substances status: Secondary | ICD-10-CM | POA: Diagnosis not present

## 2020-03-14 DIAGNOSIS — Z833 Family history of diabetes mellitus: Secondary | ICD-10-CM | POA: Diagnosis not present

## 2020-03-14 DIAGNOSIS — Z8249 Family history of ischemic heart disease and other diseases of the circulatory system: Secondary | ICD-10-CM | POA: Diagnosis not present

## 2020-03-14 DIAGNOSIS — S2222XA Fracture of body of sternum, initial encounter for closed fracture: Secondary | ICD-10-CM | POA: Diagnosis not present

## 2020-03-14 DIAGNOSIS — Z96651 Presence of right artificial knee joint: Secondary | ICD-10-CM | POA: Insufficient documentation

## 2020-03-14 DIAGNOSIS — S3991XA Unspecified injury of abdomen, initial encounter: Secondary | ICD-10-CM | POA: Diagnosis not present

## 2020-03-14 DIAGNOSIS — Z20822 Contact with and (suspected) exposure to covid-19: Secondary | ICD-10-CM | POA: Insufficient documentation

## 2020-03-14 DIAGNOSIS — S2220XA Unspecified fracture of sternum, initial encounter for closed fracture: Secondary | ICD-10-CM | POA: Diagnosis not present

## 2020-03-14 DIAGNOSIS — S300XXA Contusion of lower back and pelvis, initial encounter: Secondary | ICD-10-CM | POA: Diagnosis not present

## 2020-03-14 DIAGNOSIS — Y92410 Unspecified street and highway as the place of occurrence of the external cause: Secondary | ICD-10-CM | POA: Insufficient documentation

## 2020-03-14 DIAGNOSIS — S20219A Contusion of unspecified front wall of thorax, initial encounter: Secondary | ICD-10-CM | POA: Diagnosis not present

## 2020-03-14 LAB — CBC WITH DIFFERENTIAL/PLATELET
Abs Immature Granulocytes: 0.14 10*3/uL — ABNORMAL HIGH (ref 0.00–0.07)
Basophils Absolute: 0.1 10*3/uL (ref 0.0–0.1)
Basophils Relative: 0 %
Eosinophils Absolute: 0 10*3/uL (ref 0.0–0.5)
Eosinophils Relative: 0 %
HCT: 40.5 % (ref 36.0–46.0)
Hemoglobin: 13.3 g/dL (ref 12.0–15.0)
Immature Granulocytes: 1 %
Lymphocytes Relative: 7 %
Lymphs Abs: 1.8 10*3/uL (ref 0.7–4.0)
MCH: 28.9 pg (ref 26.0–34.0)
MCHC: 32.8 g/dL (ref 30.0–36.0)
MCV: 87.9 fL (ref 80.0–100.0)
Monocytes Absolute: 1.4 10*3/uL — ABNORMAL HIGH (ref 0.1–1.0)
Monocytes Relative: 5 %
Neutro Abs: 22.5 10*3/uL — ABNORMAL HIGH (ref 1.7–7.7)
Neutrophils Relative %: 87 %
Platelets: 288 10*3/uL (ref 150–400)
RBC: 4.61 MIL/uL (ref 3.87–5.11)
RDW: 13.7 % (ref 11.5–15.5)
WBC: 25.8 10*3/uL — ABNORMAL HIGH (ref 4.0–10.5)
nRBC: 0 % (ref 0.0–0.2)

## 2020-03-14 LAB — URINALYSIS, ROUTINE W REFLEX MICROSCOPIC
Bilirubin Urine: NEGATIVE
Glucose, UA: NEGATIVE mg/dL
Hgb urine dipstick: NEGATIVE
Ketones, ur: NEGATIVE mg/dL
Leukocytes,Ua: NEGATIVE
Nitrite: NEGATIVE
Protein, ur: NEGATIVE mg/dL
Specific Gravity, Urine: >1.046 — ABNORMAL HIGH (ref 1.005–1.030)
pH: 6 (ref 5.0–8.0)

## 2020-03-14 LAB — COMPREHENSIVE METABOLIC PANEL
ALT: 17 U/L (ref 0–44)
AST: 18 U/L (ref 15–41)
Albumin: 4.2 g/dL (ref 3.5–5.0)
Alkaline Phosphatase: 83 U/L (ref 38–126)
Anion gap: 9 (ref 5–15)
BUN: 31 mg/dL — ABNORMAL HIGH (ref 8–23)
CO2: 22 mmol/L (ref 22–32)
Calcium: 9.2 mg/dL (ref 8.9–10.3)
Chloride: 107 mmol/L (ref 98–111)
Creatinine, Ser: 1.42 mg/dL — ABNORMAL HIGH (ref 0.44–1.00)
GFR calc Af Amer: 43 mL/min — ABNORMAL LOW (ref >60)
GFR calc non Af Amer: 37 mL/min — ABNORMAL LOW (ref >60)
Glucose, Bld: 164 mg/dL — ABNORMAL HIGH (ref 70–99)
Potassium: 4.1 mmol/L (ref 3.5–5.1)
Sodium: 138 mmol/L (ref 135–145)
Total Bilirubin: 0.5 mg/dL (ref 0.3–1.2)
Total Protein: 7.2 g/dL (ref 6.5–8.1)

## 2020-03-14 MED ORDER — ACETAMINOPHEN 325 MG PO TABS: 650.0000 mg | ORAL_TABLET | ORAL | Status: DC | PRN

## 2020-03-14 MED ORDER — INSULIN ASPART 100 UNIT/ML ~~LOC~~ SOLN
0.0000 [IU] | Freq: Three times a day (TID) | SUBCUTANEOUS | Status: DC
  Filled 2020-03-14: qty 0.15

## 2020-03-14 MED ORDER — IOHEXOL 300 MG/ML  SOLN
100.0000 mL | Freq: Once | INTRAMUSCULAR | Status: AC | PRN
  Administered 2020-03-14: 100 mL via INTRAVENOUS

## 2020-03-14 MED ORDER — FAMOTIDINE 20 MG PO TABS
10.0000 mg | ORAL_TABLET | Freq: Every day | ORAL | Status: DC
  Administered 2020-03-14: 10 mg via ORAL
  Filled 2020-03-14: qty 1

## 2020-03-14 MED ORDER — ONDANSETRON HCL 4 MG/2ML IJ SOLN
4.0000 mg | Freq: Once | INTRAMUSCULAR | Status: AC
  Administered 2020-03-14: 4 mg via INTRAVENOUS
  Filled 2020-03-14: qty 2

## 2020-03-14 MED ORDER — MECLIZINE HCL 25 MG PO TABS: 12.5000 mg | ORAL_TABLET | Freq: Two times a day (BID) | ORAL | Status: DC | PRN

## 2020-03-14 MED ORDER — KCL IN DEXTROSE-NACL 20-5-0.9 MEQ/L-%-% IV SOLN
INTRAVENOUS | Status: DC
  Filled 2020-03-14 (×2): qty 1000

## 2020-03-14 MED ORDER — ONDANSETRON HCL 4 MG/2ML IJ SOLN
4.0000 mg | Freq: Four times a day (QID) | INTRAMUSCULAR | Status: DC | PRN
  Administered 2020-03-14: 4 mg via INTRAVENOUS
  Filled 2020-03-14: qty 2

## 2020-03-14 MED ORDER — FUROSEMIDE 40 MG PO TABS
20.0000 mg | ORAL_TABLET | Freq: Every day | ORAL | Status: DC
  Administered 2020-03-15: 20 mg via ORAL
  Filled 2020-03-14: qty 1

## 2020-03-14 MED ORDER — HYDROMORPHONE HCL 1 MG/ML IJ SOLN
1.0000 mg | Freq: Once | INTRAMUSCULAR | Status: AC
  Administered 2020-03-14: 1 mg via INTRAVENOUS
  Filled 2020-03-14: qty 1

## 2020-03-14 MED ORDER — TRIAMTERENE-HCTZ 75-50 MG PO TABS
1.0000 | ORAL_TABLET | Freq: Every day | ORAL | Status: DC
  Administered 2020-03-15: 1 via ORAL
  Filled 2020-03-14: qty 1

## 2020-03-14 MED ORDER — ONDANSETRON 4 MG PO TBDP: 4.0000 mg | ORAL_TABLET | Freq: Four times a day (QID) | ORAL | Status: DC | PRN

## 2020-03-14 MED ORDER — ENOXAPARIN SODIUM 40 MG/0.4ML ~~LOC~~ SOLN
40.0000 mg | SUBCUTANEOUS | Status: DC
  Administered 2020-03-15: 40 mg via SUBCUTANEOUS
  Filled 2020-03-14: qty 0.4

## 2020-03-14 MED ORDER — SODIUM CHLORIDE 0.9 % IV BOLUS
500.0000 mL | Freq: Once | INTRAVENOUS | Status: AC
  Administered 2020-03-14: 500 mL via INTRAVENOUS

## 2020-03-14 MED ORDER — BUPROPION HCL ER (XL) 150 MG PO TB24
300.0000 mg | ORAL_TABLET | Freq: Every day | ORAL | Status: DC
  Administered 2020-03-15: 300 mg via ORAL
  Filled 2020-03-14: qty 2

## 2020-03-14 MED ORDER — ESCITALOPRAM OXALATE 10 MG PO TABS
10.0000 mg | ORAL_TABLET | Freq: Every day | ORAL | Status: DC
  Filled 2020-03-14: qty 1
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
## 2020-03-15 DIAGNOSIS — S2220XA Unspecified fracture of sternum, initial encounter for closed fracture: Secondary | ICD-10-CM | POA: Diagnosis not present

## 2020-03-15 LAB — COMPREHENSIVE METABOLIC PANEL
ALT: 18 U/L (ref 0–44)
AST: 19 U/L (ref 15–41)
Albumin: 3.5 g/dL (ref 3.5–5.0)
Alkaline Phosphatase: 74 U/L (ref 38–126)
Anion gap: 11 (ref 5–15)
BUN: 25 mg/dL — ABNORMAL HIGH (ref 8–23)
CO2: 22 mmol/L (ref 22–32)
Calcium: 8.4 mg/dL — ABNORMAL LOW (ref 8.9–10.3)
Chloride: 101 mmol/L (ref 98–111)
Creatinine, Ser: 1.45 mg/dL — ABNORMAL HIGH (ref 0.44–1.00)
GFR calc Af Amer: 42 mL/min — ABNORMAL LOW (ref >60)
GFR calc non Af Amer: 36 mL/min — ABNORMAL LOW (ref >60)
Glucose, Bld: 151 mg/dL — ABNORMAL HIGH (ref 70–99)
Potassium: 3.8 mmol/L (ref 3.5–5.1)
Sodium: 134 mmol/L — ABNORMAL LOW (ref 135–145)
Total Bilirubin: 0.9 mg/dL (ref 0.3–1.2)
Total Protein: 6.2 g/dL — ABNORMAL LOW (ref 6.5–8.1)

## 2020-03-15 LAB — CBC
HCT: 35.1 % — ABNORMAL LOW (ref 36.0–46.0)
Hemoglobin: 11.8 g/dL — ABNORMAL LOW (ref 12.0–15.0)
MCH: 29.1 pg (ref 26.0–34.0)
MCHC: 33.6 g/dL (ref 30.0–36.0)
MCV: 86.7 fL (ref 80.0–100.0)
Platelets: 250 10*3/uL (ref 150–400)
RBC: 4.05 MIL/uL (ref 3.87–5.11)
RDW: 13.7 % (ref 11.5–15.5)
WBC: 8.9 10*3/uL (ref 4.0–10.5)
nRBC: 0 % (ref 0.0–0.2)

## 2020-03-15 LAB — SARS CORONAVIRUS 2 (TAT 6-24 HRS): SARS Coronavirus 2: NEGATIVE

## 2020-03-15 LAB — GLUCOSE, CAPILLARY
Glucose-Capillary: 146 mg/dL — ABNORMAL HIGH (ref 70–99)
Glucose-Capillary: 200 mg/dL — ABNORMAL HIGH (ref 70–99)

## 2020-03-15 LAB — HEMOGLOBIN A1C
Hgb A1c MFr Bld: 6 % — ABNORMAL HIGH (ref 4.8–5.6)
Mean Plasma Glucose: 125.5 mg/dL

## 2020-03-15 MED ORDER — IBUPROFEN 600 MG PO TABS: 600.0000 mg | ORAL_TABLET | Freq: Four times a day (QID) | ORAL | 0 refills | Status: AC | PRN

## 2020-03-15 MED ORDER — ACETAMINOPHEN 500 MG PO TABS: 1000.0000 mg | ORAL_TABLET | Freq: Four times a day (QID) | ORAL | Status: AC | PRN

## 2020-03-15 MED ORDER — METHOCARBAMOL 500 MG PO TABS: 500.0000 mg | ORAL_TABLET | Freq: Three times a day (TID) | ORAL | 0 refills | Status: DC | PRN

## 2020-03-15 MED ORDER — OXYCODONE HCL 5 MG PO TABS
10.0000 mg | ORAL_TABLET | Freq: Four times a day (QID) | ORAL | Status: DC | PRN
  Administered 2020-03-15 (×2): 10 mg via ORAL
  Filled 2020-03-15 (×2): qty 2

## 2020-03-15 MED ORDER — IBUPROFEN 200 MG PO TABS
600.0000 mg | ORAL_TABLET | Freq: Four times a day (QID) | ORAL | Status: DC | PRN
  Administered 2020-03-15: 600 mg via ORAL
  Filled 2020-03-15: qty 3

## 2020-03-15 MED ORDER — OXYCODONE HCL 10 MG PO TABS: 10.0000 mg | ORAL_TABLET | Freq: Four times a day (QID) | ORAL | 0 refills | Status: DC | PRN
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
## 2020-03-15 NOTE — Progress Notes (Signed)
Discharge instructions reviewed with patient, IV removed.  Pt to be discharged home with family member.
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
## 2020-03-15 NOTE — Plan of Care (Signed)
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
## 2020-03-16 ENCOUNTER — Encounter: Payer: Self-pay | Admitting: Family Medicine

## 2020-03-18 ENCOUNTER — Telehealth: Payer: Self-pay | Admitting: *Deleted

## 2020-03-18 NOTE — Telephone Encounter (Signed)
Transition Care Management Follow-up Telephone Call   Date discharged? 03.26.2021   How have you been since you were released from the hospital? "I'm in  a lot of pain because I fractured my sternum"    Do you understand why you were in the hospital? yes   Do you understand the discharge instructions? yes   Where were you discharged to? Home   Items Reviewed:  Medications reviewed: yes  Allergies reviewed: yes  Dietary changes reviewed: N/A   Referrals reviewed: N/A    Functional Questionnaire:   Activities of Daily Living (ADLs):   She states they are independent in the following: ambulation, bathing and hygiene, feeding, continence, grooming, toileting and dressing States they require assistance with the following: N/A    Any transportation issues/concerns?: no   Any patient concerns? Yes. Oxycodone causing Nausea    Confirmed importance and date/time of follow-up visits scheduled yes  Provider Appointment booked with Dr. [name] 03/19/2020   Confirmed with patient if condition begins to worsen call PCP or go to the ER.  Patient was given the office number and encouraged to call back with question or concerns.  : yes

## 2020-03-19 ENCOUNTER — Encounter: Payer: Self-pay | Admitting: Family Medicine

## 2020-03-19 ENCOUNTER — Ambulatory Visit (INDEPENDENT_AMBULATORY_CARE_PROVIDER_SITE_OTHER): Payer: Medicare Other | Admitting: Family Medicine

## 2020-03-19 ENCOUNTER — Other Ambulatory Visit: Payer: Self-pay

## 2020-03-19 VITALS — BP 126/78 | HR 83 | Temp 98.0°F | Wt 209.2 lb

## 2020-03-19 DIAGNOSIS — R739 Hyperglycemia, unspecified: Secondary | ICD-10-CM

## 2020-03-19 DIAGNOSIS — S301XXD Contusion of abdominal wall, subsequent encounter: Secondary | ICD-10-CM | POA: Diagnosis not present

## 2020-03-19 DIAGNOSIS — S2220XD Unspecified fracture of sternum, subsequent encounter for fracture with routine healing: Secondary | ICD-10-CM

## 2020-03-19 LAB — GLUCOSE, POCT (MANUAL RESULT ENTRY): POC Glucose: 126 mg/dl — AB (ref 70–99)

## 2020-03-19 MED ORDER — OXYCODONE HCL 10 MG PO TABS: 10.0000 mg | ORAL_TABLET | Freq: Four times a day (QID) | ORAL | 0 refills | Status: AC | PRN
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
## 2020-03-20 ENCOUNTER — Encounter: Payer: Self-pay | Admitting: Family Medicine

## 2020-03-25 ENCOUNTER — Other Ambulatory Visit: Payer: Self-pay

## 2020-03-25 ENCOUNTER — Telehealth (INDEPENDENT_AMBULATORY_CARE_PROVIDER_SITE_OTHER): Payer: Medicare Other | Admitting: Family Medicine

## 2020-03-25 DIAGNOSIS — M5416 Radiculopathy, lumbar region: Secondary | ICD-10-CM | POA: Diagnosis not present

## 2020-03-25 DIAGNOSIS — K5903 Drug induced constipation: Secondary | ICD-10-CM | POA: Diagnosis not present

## 2020-03-25 MED ORDER — PREDNISONE 10 MG PO TABS: ORAL_TABLET | ORAL | 0 refills | Status: AC
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
## 2020-03-26 ENCOUNTER — Encounter: Payer: Self-pay | Admitting: Family Medicine

## 2020-03-28 ENCOUNTER — Other Ambulatory Visit: Payer: Self-pay

## 2020-03-29 ENCOUNTER — Encounter: Payer: Self-pay | Admitting: Family Medicine

## 2020-03-29 ENCOUNTER — Ambulatory Visit (INDEPENDENT_AMBULATORY_CARE_PROVIDER_SITE_OTHER): Payer: Medicare Other | Admitting: Family Medicine

## 2020-03-29 VITALS — BP 120/76 | HR 84 | Temp 97.7°F | Wt 204.0 lb

## 2020-03-29 DIAGNOSIS — S2222XD Fracture of body of sternum, subsequent encounter for fracture with routine healing: Secondary | ICD-10-CM

## 2020-03-29 DIAGNOSIS — M5416 Radiculopathy, lumbar region: Secondary | ICD-10-CM | POA: Diagnosis not present

## 2020-03-29 DIAGNOSIS — S20219D Contusion of unspecified front wall of thorax, subsequent encounter: Secondary | ICD-10-CM | POA: Diagnosis not present

## 2020-03-29 MED ORDER — METHYLPREDNISOLONE ACETATE 80 MG/ML IJ SUSP
80.0000 mg | Freq: Once | INTRAMUSCULAR | Status: AC
  Administered 2020-03-29: 80 mg via INTRAMUSCULAR

## 2020-03-29 NOTE — Patient Instructions (Signed)
Radicular Pain Radicular pain is a type of pain that spreads from your back or neck along a spinal nerve. Spinal nerves are nerves that leave the spinal cord and go to the muscles. Radicular pain is sometimes called radiculopathy, radiculitis, or a pinched nerve. When you have this type of pain, you may also have weakness, numbness, or tingling in the area of your body that is supplied by the nerve. The pain may feel sharp and burning. Depending on which spinal nerve is affected, the pain may occur in the:  Neck area (cervical radicular pain). You may also feel pain, numbness, weakness, or tingling in the arms.  Mid-spine area (thoracic radicular pain). You would feel this pain in the back and chest. This type is rare.  Lower back area (lumbar radicular pain). You would feel this pain as low back pain. You may feel pain, numbness, weakness, or tingling in the buttocks or legs. Sciatica is a type of lumbar radicular pain that shoots down the back of the leg. Radicular pain occurs when one of the spinal nerves becomes irritated or squeezed (compressed). It is often caused by something pushing on a spinal nerve, such as one of the bones of the spine (vertebrae) or one of the round cushions between vertebrae (intervertebral disks). This can result from:  An injury.  Wear and tear or aging of a disk.  The growth of a bone spur that pushes on the nerve. Radicular pain often goes away when you follow instructions from your health care provider for relieving pain at home. Follow these instructions at home: Managing pain      If directed, put ice on the affected area: ? Put ice in a plastic bag. ? Place a towel between your skin and the bag. ? Leave the ice on for 20 minutes, 2-3 times a day.  If directed, apply heat to the affected area as often as told by your health care provider. Use the heat source that your health care provider recommends, such as a moist heat pack or a heating pad. ? Place  a towel between your skin and the heat source. ? Leave the heat on for 20-30 minutes. ? Remove the heat if your skin turns bright red. This is especially important if you are unable to feel pain, heat, or cold. You may have a greater risk of getting burned. Activity   Do not sit or rest in bed for long periods of time.  Try to stay as active as possible. Ask your health care provider what type of exercise or activity is best for you.  Avoid activities that make your pain worse, such as bending and lifting.  Do not lift anything that is heavier than 10 lb (4.5 kg), or the limit that you are told, until your health care provider says that it is safe.  Practice using proper technique when lifting items. Proper lifting technique involves bending your knees and rising up.  Do strength and range-of-motion exercises only as told by your health care provider or physical therapist. General instructions  Take over-the-counter and prescription medicines only as told by your health care provider.  Pay attention to any changes in your symptoms.  Keep all follow-up visits as told by your health care provider. This is important. ? Your health care provider may send you to a physical therapist to help with this pain. Contact a health care provider if:  Your pain and other symptoms get worse.  Your pain medicine is not   helping.  Your pain has not improved after a few weeks of home care.  You have a fever. Get help right away if:  You have severe pain, weakness, or numbness.  You have difficulty with bladder or bowel control. Summary  Radicular pain is a type of pain that spreads from your back or neck along a spinal nerve.  When you have radicular pain, you may also have weakness, numbness, or tingling in the area of your body that is supplied by the nerve.  The pain may feel sharp or burning.  Radicular pain may be treated with ice, heat, medicines, or physical therapy. This  information is not intended to replace advice given to you by your health care provider. Make sure you discuss any questions you have with your health care provider. Document Revised: 06/21/2018 Document Reviewed: 06/21/2018 Elsevier Patient Education  2020 [employer]  
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
## 2020-04-03 DIAGNOSIS — Z6831 Body mass index (BMI) 31.0-31.9, adult: Secondary | ICD-10-CM | POA: Diagnosis not present

## 2020-04-03 DIAGNOSIS — I10 Essential (primary) hypertension: Secondary | ICD-10-CM | POA: Diagnosis not present

## 2020-04-03 DIAGNOSIS — M5441 Lumbago with sciatica, right side: Secondary | ICD-10-CM | POA: Diagnosis not present

## 2020-04-08 DIAGNOSIS — S8001XA Contusion of right knee, initial encounter: Secondary | ICD-10-CM | POA: Diagnosis not present

## 2020-04-08 DIAGNOSIS — M545 Low back pain: Secondary | ICD-10-CM | POA: Diagnosis not present

## 2020-05-19 ENCOUNTER — Other Ambulatory Visit: Payer: Self-pay | Admitting: Family Medicine

## 2020-06-04 DIAGNOSIS — I10 Essential (primary) hypertension: Secondary | ICD-10-CM | POA: Diagnosis not present

## 2020-06-04 DIAGNOSIS — F325 Major depressive disorder, single episode, in full remission: Secondary | ICD-10-CM | POA: Diagnosis not present

## 2020-06-04 DIAGNOSIS — E041 Nontoxic single thyroid nodule: Secondary | ICD-10-CM | POA: Diagnosis not present

## 2020-06-04 DIAGNOSIS — R7303 Prediabetes: Secondary | ICD-10-CM | POA: Diagnosis not present

## 2020-07-10 DIAGNOSIS — M549 Dorsalgia, unspecified: Secondary | ICD-10-CM | POA: Diagnosis not present

## 2020-07-10 DIAGNOSIS — G8929 Other chronic pain: Secondary | ICD-10-CM | POA: Diagnosis not present

## 2020-07-10 DIAGNOSIS — S2220XA Unspecified fracture of sternum, initial encounter for closed fracture: Secondary | ICD-10-CM | POA: Diagnosis not present

## 2020-07-11 DIAGNOSIS — M549 Dorsalgia, unspecified: Secondary | ICD-10-CM | POA: Diagnosis not present

## 2020-07-11 DIAGNOSIS — G8929 Other chronic pain: Secondary | ICD-10-CM | POA: Diagnosis not present

## 2020-07-11 DIAGNOSIS — S2220XA Unspecified fracture of sternum, initial encounter for closed fracture: Secondary | ICD-10-CM | POA: Diagnosis not present

## 2020-07-19 DIAGNOSIS — M79671 Pain in right foot: Secondary | ICD-10-CM | POA: Diagnosis not present

## 2020-07-19 DIAGNOSIS — M549 Dorsalgia, unspecified: Secondary | ICD-10-CM | POA: Diagnosis not present

## 2020-07-25 ENCOUNTER — Telehealth: Payer: Self-pay | Admitting: Family Medicine

## 2020-07-25 NOTE — Telephone Encounter (Signed)
Spoke with patient to schedule AWV, She stated that she now lives in [place]. She has changed provider

## 2020-08-08 DIAGNOSIS — M5136 Other intervertebral disc degeneration, lumbar region: Secondary | ICD-10-CM | POA: Diagnosis not present

## 2020-08-08 DIAGNOSIS — M545 Low back pain: Secondary | ICD-10-CM | POA: Diagnosis not present

## 2020-08-08 DIAGNOSIS — M47896 Other spondylosis, lumbar region: Secondary | ICD-10-CM | POA: Diagnosis not present

## 2020-08-20 ENCOUNTER — Other Ambulatory Visit: Payer: Self-pay | Admitting: Family Medicine

## 2020-08-22 DIAGNOSIS — M5136 Other intervertebral disc degeneration, lumbar region: Secondary | ICD-10-CM | POA: Diagnosis not present

## 2020-08-22 DIAGNOSIS — M961 Postlaminectomy syndrome, not elsewhere classified: Secondary | ICD-10-CM | POA: Diagnosis not present

## 2020-08-22 DIAGNOSIS — M5431 Sciatica, right side: Secondary | ICD-10-CM | POA: Diagnosis not present

## 2020-08-22 DIAGNOSIS — M47896 Other spondylosis, lumbar region: Secondary | ICD-10-CM | POA: Diagnosis not present

## 2020-09-10 DIAGNOSIS — Z23 Encounter for immunization: Secondary | ICD-10-CM | POA: Diagnosis not present

## 2020-09-10 DIAGNOSIS — Z1331 Encounter for screening for depression: Secondary | ICD-10-CM | POA: Diagnosis not present

## 2020-09-10 DIAGNOSIS — Z7189 Other specified counseling: Secondary | ICD-10-CM | POA: Diagnosis not present

## 2020-09-10 DIAGNOSIS — Z1339 Encounter for screening examination for other mental health and behavioral disorders: Secondary | ICD-10-CM | POA: Diagnosis not present

## 2020-09-10 DIAGNOSIS — I10 Essential (primary) hypertension: Secondary | ICD-10-CM | POA: Diagnosis not present

## 2020-09-10 DIAGNOSIS — M549 Dorsalgia, unspecified: Secondary | ICD-10-CM | POA: Diagnosis not present

## 2020-09-10 DIAGNOSIS — R7303 Prediabetes: Secondary | ICD-10-CM | POA: Diagnosis not present

## 2020-09-12 DIAGNOSIS — M5136 Other intervertebral disc degeneration, lumbar region: Secondary | ICD-10-CM | POA: Diagnosis not present

## 2020-09-12 DIAGNOSIS — M47896 Other spondylosis, lumbar region: Secondary | ICD-10-CM | POA: Diagnosis not present

## 2020-09-12 DIAGNOSIS — M961 Postlaminectomy syndrome, not elsewhere classified: Secondary | ICD-10-CM | POA: Diagnosis not present

## 2020-09-12 DIAGNOSIS — M4310 Spondylolisthesis, site unspecified: Secondary | ICD-10-CM | POA: Diagnosis not present

## 2020-09-23 DIAGNOSIS — E041 Nontoxic single thyroid nodule: Secondary | ICD-10-CM | POA: Diagnosis not present

## 2020-09-23 DIAGNOSIS — R928 Other abnormal and inconclusive findings on diagnostic imaging of breast: Secondary | ICD-10-CM | POA: Diagnosis not present

## 2020-09-25 DIAGNOSIS — L814 Other melanin hyperpigmentation: Secondary | ICD-10-CM | POA: Diagnosis not present

## 2020-09-25 DIAGNOSIS — L304 Erythema intertrigo: Secondary | ICD-10-CM | POA: Diagnosis not present

## 2020-09-25 DIAGNOSIS — L820 Inflamed seborrheic keratosis: Secondary | ICD-10-CM | POA: Diagnosis not present

## 2020-09-25 DIAGNOSIS — D1801 Hemangioma of skin and subcutaneous tissue: Secondary | ICD-10-CM | POA: Diagnosis not present

## 2020-09-25 DIAGNOSIS — L821 Other seborrheic keratosis: Secondary | ICD-10-CM | POA: Diagnosis not present

## 2020-10-14 DIAGNOSIS — M961 Postlaminectomy syndrome, not elsewhere classified: Secondary | ICD-10-CM | POA: Diagnosis not present

## 2020-10-14 DIAGNOSIS — Z6831 Body mass index (BMI) 31.0-31.9, adult: Secondary | ICD-10-CM | POA: Diagnosis not present

## 2020-10-14 DIAGNOSIS — M5416 Radiculopathy, lumbar region: Secondary | ICD-10-CM | POA: Diagnosis not present

## 2020-10-25 DIAGNOSIS — M5416 Radiculopathy, lumbar region: Secondary | ICD-10-CM | POA: Diagnosis not present

## 2020-11-07 DIAGNOSIS — M961 Postlaminectomy syndrome, not elsewhere classified: Secondary | ICD-10-CM | POA: Diagnosis not present

## 2020-11-26 DIAGNOSIS — F330 Major depressive disorder, recurrent, mild: Secondary | ICD-10-CM | POA: Diagnosis not present

## 2020-11-26 DIAGNOSIS — F411 Generalized anxiety disorder: Secondary | ICD-10-CM | POA: Diagnosis not present

## 2020-11-26 DIAGNOSIS — F4542 Pain disorder with related psychological factors: Secondary | ICD-10-CM | POA: Diagnosis not present

## 2020-12-02 DIAGNOSIS — R7303 Prediabetes: Secondary | ICD-10-CM | POA: Diagnosis not present

## 2020-12-02 DIAGNOSIS — N183 Chronic kidney disease, stage 3 unspecified: Secondary | ICD-10-CM | POA: Diagnosis not present

## 2020-12-02 DIAGNOSIS — N1831 Chronic kidney disease, stage 3a: Secondary | ICD-10-CM | POA: Diagnosis not present

## 2020-12-02 DIAGNOSIS — I10 Essential (primary) hypertension: Secondary | ICD-10-CM | POA: Diagnosis not present

## 2020-12-10 DIAGNOSIS — M8580 Other specified disorders of bone density and structure, unspecified site: Secondary | ICD-10-CM | POA: Diagnosis not present

## 2020-12-10 DIAGNOSIS — F325 Major depressive disorder, single episode, in full remission: Secondary | ICD-10-CM | POA: Diagnosis not present

## 2020-12-10 DIAGNOSIS — R7303 Prediabetes: Secondary | ICD-10-CM | POA: Diagnosis not present

## 2020-12-10 DIAGNOSIS — N1831 Chronic kidney disease, stage 3a: Secondary | ICD-10-CM | POA: Diagnosis not present

## 2020-12-18 DIAGNOSIS — M961 Postlaminectomy syndrome, not elsewhere classified: Secondary | ICD-10-CM | POA: Diagnosis not present

## 2020-12-25 DIAGNOSIS — M25511 Pain in right shoulder: Secondary | ICD-10-CM | POA: Diagnosis not present

## 2020-12-31 DIAGNOSIS — M5416 Radiculopathy, lumbar region: Secondary | ICD-10-CM | POA: Diagnosis not present

## 2021-01-22 DIAGNOSIS — M961 Postlaminectomy syndrome, not elsewhere classified: Secondary | ICD-10-CM | POA: Diagnosis not present

## 2021-03-31 DIAGNOSIS — M961 Postlaminectomy syndrome, not elsewhere classified: Secondary | ICD-10-CM | POA: Diagnosis not present

## 2021-04-01 DIAGNOSIS — J4530 Mild persistent asthma, uncomplicated: Secondary | ICD-10-CM | POA: Diagnosis not present

## 2021-04-01 DIAGNOSIS — E669 Obesity, unspecified: Secondary | ICD-10-CM | POA: Diagnosis not present

## 2021-04-08 DIAGNOSIS — I10 Essential (primary) hypertension: Secondary | ICD-10-CM | POA: Diagnosis not present

## 2021-04-08 DIAGNOSIS — N1831 Chronic kidney disease, stage 3a: Secondary | ICD-10-CM | POA: Diagnosis not present

## 2021-04-08 DIAGNOSIS — R7303 Prediabetes: Secondary | ICD-10-CM | POA: Diagnosis not present

## 2021-04-09 DIAGNOSIS — J4530 Mild persistent asthma, uncomplicated: Secondary | ICD-10-CM | POA: Diagnosis not present

## 2021-04-15 DIAGNOSIS — E785 Hyperlipidemia, unspecified: Secondary | ICD-10-CM | POA: Diagnosis not present

## 2021-04-15 DIAGNOSIS — N1831 Chronic kidney disease, stage 3a: Secondary | ICD-10-CM | POA: Diagnosis not present

## 2021-04-15 DIAGNOSIS — E1169 Type 2 diabetes mellitus with other specified complication: Secondary | ICD-10-CM | POA: Diagnosis not present

## 2021-04-15 DIAGNOSIS — F325 Major depressive disorder, single episode, in full remission: Secondary | ICD-10-CM | POA: Diagnosis not present
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
## 2021-04-28 DIAGNOSIS — J4530 Mild persistent asthma, uncomplicated: Secondary | ICD-10-CM | POA: Diagnosis not present

## 2021-04-29 DIAGNOSIS — E669 Obesity, unspecified: Secondary | ICD-10-CM | POA: Diagnosis not present

## 2021-04-29 DIAGNOSIS — Z6832 Body mass index (BMI) 32.0-32.9, adult: Secondary | ICD-10-CM | POA: Diagnosis not present

## 2021-04-29 DIAGNOSIS — J4530 Mild persistent asthma, uncomplicated: Secondary | ICD-10-CM | POA: Diagnosis not present

## 2021-05-09 DIAGNOSIS — M47816 Spondylosis without myelopathy or radiculopathy, lumbar region: Secondary | ICD-10-CM | POA: Diagnosis not present

## 2021-05-16 DIAGNOSIS — M25552 Pain in left hip: Secondary | ICD-10-CM | POA: Diagnosis not present

## 2021-05-16 DIAGNOSIS — M47816 Spondylosis without myelopathy or radiculopathy, lumbar region: Secondary | ICD-10-CM | POA: Diagnosis not present

## 2021-05-16 DIAGNOSIS — M25551 Pain in right hip: Secondary | ICD-10-CM | POA: Diagnosis not present

## 2021-06-24 DIAGNOSIS — M25552 Pain in left hip: Secondary | ICD-10-CM | POA: Diagnosis not present

## 2021-06-24 DIAGNOSIS — M47816 Spondylosis without myelopathy or radiculopathy, lumbar region: Secondary | ICD-10-CM | POA: Diagnosis not present

## 2021-06-30 DIAGNOSIS — M961 Postlaminectomy syndrome, not elsewhere classified: Secondary | ICD-10-CM | POA: Diagnosis not present

## 2021-06-30 DIAGNOSIS — M47816 Spondylosis without myelopathy or radiculopathy, lumbar region: Secondary | ICD-10-CM | POA: Diagnosis not present

## 2021-06-30 DIAGNOSIS — M549 Dorsalgia, unspecified: Secondary | ICD-10-CM | POA: Diagnosis not present

## 2021-10-21 ENCOUNTER — Other Ambulatory Visit: Payer: Self-pay | Admitting: Family Medicine
